# Patient Record
Sex: Female | Born: 1975 | Race: Black or African American | Hispanic: No | Marital: Married | Smoking: Never smoker
Health system: Southern US, Community
[De-identification: ages and names within clinical notes are randomized; demographics above are authoritative.]

## PROBLEM LIST (undated history)

## (undated) DIAGNOSIS — M069 Rheumatoid arthritis, unspecified: Secondary | ICD-10-CM

## (undated) DIAGNOSIS — D573 Sickle-cell trait: Secondary | ICD-10-CM

## (undated) DIAGNOSIS — D571 Sickle-cell disease without crisis: Secondary | ICD-10-CM

## (undated) DIAGNOSIS — Z21 Asymptomatic human immunodeficiency virus [HIV] infection status: Secondary | ICD-10-CM

## (undated) DIAGNOSIS — G894 Chronic pain syndrome: Secondary | ICD-10-CM

## (undated) DIAGNOSIS — B20 Human immunodeficiency virus [HIV] disease: Secondary | ICD-10-CM

## (undated) HISTORY — DX: Chronic pain syndrome: G89.4

## (undated) HISTORY — PX: OSTEOCHONDROMA EXCISION: SHX2137

## (undated) HISTORY — DX: Sickle-cell trait: D57.3

---

## 2010-06-12 ENCOUNTER — Other Ambulatory Visit: Payer: Self-pay | Admitting: Specialist

## 2010-06-12 ENCOUNTER — Ambulatory Visit
Admission: RE | Admit: 2010-06-12 | Discharge: 2010-06-12 | Disposition: A | Discharge status: Home / Self Care | Payer: PRIVATE HEALTH INSURANCE | Source: Ambulatory Visit | Attending: Specialist | Admitting: Specialist

## 2010-10-05 ENCOUNTER — Emergency Department (HOSPITAL_BASED_OUTPATIENT_CLINIC_OR_DEPARTMENT_OTHER)
Admission: EM | Admit: 2010-10-05 | Discharge: 2010-10-05 | Disposition: A | Discharge status: Home / Self Care | Payer: PRIVATE HEALTH INSURANCE | Attending: Emergency Medicine | Admitting: Emergency Medicine

## 2010-10-05 ENCOUNTER — Emergency Department (INDEPENDENT_AMBULATORY_CARE_PROVIDER_SITE_OTHER): Payer: PRIVATE HEALTH INSURANCE

## 2010-10-05 DIAGNOSIS — R5081 Fever presenting with conditions classified elsewhere: Secondary | ICD-10-CM | POA: Insufficient documentation

## 2010-10-05 DIAGNOSIS — D5700 Hb-SS disease with crisis, unspecified: Secondary | ICD-10-CM

## 2010-10-05 DIAGNOSIS — R509 Fever, unspecified: Secondary | ICD-10-CM

## 2010-10-05 DIAGNOSIS — R05 Cough: Secondary | ICD-10-CM | POA: Insufficient documentation

## 2010-10-05 DIAGNOSIS — R319 Hematuria, unspecified: Secondary | ICD-10-CM | POA: Insufficient documentation

## 2010-10-05 DIAGNOSIS — M2550 Pain in unspecified joint: Secondary | ICD-10-CM | POA: Insufficient documentation

## 2010-10-05 DIAGNOSIS — D571 Sickle-cell disease without crisis: Secondary | ICD-10-CM

## 2010-10-05 DIAGNOSIS — R059 Cough, unspecified: Secondary | ICD-10-CM

## 2010-10-05 HISTORY — DX: Sickle-cell disease without crisis: D57.1

## 2010-10-05 LAB — CBC
HCT: 37 % (ref 36.0–46.0)
Hemoglobin: 13.5 g/dL (ref 12.0–15.0)
MCH: 30.8 pg (ref 26.0–34.0)
MCHC: 36.5 g/dL — ABNORMAL HIGH (ref 30.0–36.0)
RBC: 4.38 MIL/uL (ref 3.87–5.11)

## 2010-10-05 LAB — COMPREHENSIVE METABOLIC PANEL
ALT: 26 U/L (ref 0–35)
Alkaline Phosphatase: 57 U/L (ref 39–117)
BUN: 9 mg/dL (ref 6–23)
CO2: 25 mEq/L (ref 19–32)
GFR calc Af Amer: >60 mL/min (ref >60)
GFR calc non Af Amer: >60 mL/min (ref >60)
Glucose, Bld: 102 mg/dL — ABNORMAL HIGH (ref 70–99)
Potassium: 3.8 mEq/L (ref 3.5–5.1)
Total Protein: 8 g/dL (ref 6.0–8.3)

## 2010-10-05 MED ORDER — ONDANSETRON HCL 4 MG/2ML IJ SOLN
INTRAMUSCULAR | Status: AC
  Filled 2010-10-05: qty 2

## 2010-10-05 MED ORDER — HYDROMORPHONE HCL 1 MG/ML IJ SOLN
1.0000 mg | Freq: Once | INTRAMUSCULAR | Status: AC
  Administered 2010-10-05: 1 mg via INTRAVENOUS
  Filled 2010-10-05: qty 1

## 2010-10-05 MED ORDER — ONDANSETRON HCL 4 MG/2ML IJ SOLN
4.0000 mg | Freq: Once | INTRAMUSCULAR | Status: AC
  Administered 2010-10-05: 4 mg via INTRAVENOUS

## 2010-10-05 MED ORDER — KETOROLAC TROMETHAMINE 30 MG/ML IJ SOLN
30.0000 mg | Freq: Once | INTRAMUSCULAR | Status: AC
  Administered 2010-10-05: 30 mg via INTRAVENOUS
  Filled 2010-10-05: qty 1

## 2010-10-05 MED ORDER — HYDROMORPHONE HCL 1 MG/ML IJ SOLN
1.0000 mg | Freq: Once | INTRAMUSCULAR | Status: AC
  Administered 2010-10-05: 1 mg via INTRAVENOUS

## 2010-10-05 MED ORDER — HYDROMORPHONE HCL 1 MG/ML IJ SOLN
INTRAMUSCULAR | Status: AC
  Filled 2010-10-05: qty 1

## 2010-10-05 MED ORDER — SODIUM CHLORIDE 0.9 % IV BOLUS (SEPSIS): 500.0000 mL | Freq: Once | INTRAVENOUS | Status: DC

## 2010-10-05 NOTE — ED Provider Notes (Signed)
History     CSN: [id number] Arrival date & time: 10/05/2010  7:45 PM  Chief Complaint  Patient presents with  . Sickle Cell Pain Crisis   Patient is a 35 y.o. female presenting with sickle cell pain. The history is provided by the patient. No language interpreter was used.  Sickle Cell Pain Crisis  This is a recurrent problem. The onset was sudden. The problem occurs continuously. The problem has been unchanged. The pain is associated with an unknown factor. The pain location is generalized. The symptoms are relieved by nothing. The symptoms are not relieved by acetaminophen. The symptoms are aggravated by activity and movement. Associated symptoms include hematuria and joint pain. Pertinent negatives include no chest pain, no blurred vision, no double vision, no photophobia, no abdominal pain, no constipation, no diarrhea, no nausea, no dysuria, no congestion, no ear pain, no headaches, no rhinorrhea, no sore throat, no swollen glands, no loss of sensation, no tingling, no weakness, no difficulty breathing and no eye pain.    Past Medical History  Diagnosis Date  . Sickle cell anemia     History reviewed. No pertinent past surgical history.  No family history on file.  History  Substance Use Topics  . Smoking status: Never Smoker   . Smokeless tobacco: Not on file  . Alcohol Use: No    OB History    Grav Para Term Preterm Abortions TAB SAB Ect Mult Living                  Review of Systems  Constitutional: Negative for activity change.  HENT: Negative for ear pain, congestion, sore throat and rhinorrhea.   Eyes: Negative for blurred vision, double vision, photophobia and pain.  Respiratory: Negative for apnea.   Cardiovascular: Negative for chest pain.  Gastrointestinal: Negative for nausea, abdominal pain, diarrhea, constipation and abdominal distention.  Genitourinary: Positive for hematuria. Negative for dysuria.  Musculoskeletal: Positive for joint pain.  Skin:  Negative.   Neurological: Negative for tingling, weakness and headaches.  Hematological: Negative for adenopathy.  Psychiatric/Behavioral: Negative for agitation.    Physical Exam  BP 124/86  Pulse 87  Temp(Src) 98.5 F (36.9 C) (Oral)  Resp 20  Ht 5\' 4"  (1.626 m)  Wt 136 lb (61.689 kg)  BMI 23.34 kg/m2  SpO2 100%  LMP 10/02/2010  Physical Exam  Constitutional: She is oriented to person, place, and time. She appears well-developed and well-nourished.  HENT:  Head: Normocephalic and atraumatic.  Eyes: EOM are normal. Pupils are equal, round, and reactive to light. Right eye exhibits no discharge. Left eye exhibits no discharge. No scleral icterus.  Neck: Normal range of motion. Neck supple.  Cardiovascular: Normal rate and regular rhythm.   Pulmonary/Chest: Effort normal and breath sounds normal. No respiratory distress.  Abdominal: Soft. Bowel sounds are normal.  Musculoskeletal: Normal range of motion. She exhibits no edema and no tenderness.  Neurological: She is alert and oriented to person, place, and time. No cranial nerve deficit.  Skin: Skin is warm and dry. No erythema.  Psychiatric: She has a normal mood and affect.    ED Course  Procedures  MDM   Follow up with your family doctor return for CP, SOB n/v/d. Return for worsening symptoms  Kingsly Kloepfer K Crystin Lechtenberg-Rasch, MD 10/05/10 2239

## 2010-10-05 NOTE — ED Notes (Signed)
C/o "pain all over" since yesterday

## 2010-10-05 NOTE — ED Notes (Signed)
In and out cath performed, pt tolerated well.

## 2010-10-06 LAB — RETICULOCYTES: Retic Count, Absolute: 61.7 10*3/uL (ref 19.0–186.0)

## 2011-01-26 ENCOUNTER — Ambulatory Visit: Payer: PRIVATE HEALTH INSURANCE

## 2011-01-26 DIAGNOSIS — R05 Cough: Secondary | ICD-10-CM

## 2011-08-15 ENCOUNTER — Non-Acute Institutional Stay (HOSPITAL_COMMUNITY)
Admission: AD | Admit: 2011-08-15 | Discharge: 2011-08-16 | Disposition: A | Discharge status: Home / Self Care | Payer: Self-pay | Attending: Internal Medicine | Admitting: Internal Medicine

## 2011-08-15 ENCOUNTER — Encounter (HOSPITAL_COMMUNITY): Payer: Self-pay | Admitting: Hematology

## 2011-08-15 ENCOUNTER — Non-Acute Institutional Stay (HOSPITAL_COMMUNITY): Payer: Self-pay

## 2011-08-15 DIAGNOSIS — D5700 Hb-SS disease with crisis, unspecified: Secondary | ICD-10-CM | POA: Insufficient documentation

## 2011-08-15 DIAGNOSIS — R0789 Other chest pain: Secondary | ICD-10-CM | POA: Insufficient documentation

## 2011-08-15 DIAGNOSIS — E860 Dehydration: Secondary | ICD-10-CM | POA: Insufficient documentation

## 2011-08-15 LAB — CBC
HCT: 35.7 % — ABNORMAL LOW (ref 36.0–46.0)
MCHC: 35 g/dL (ref 30.0–36.0)
Platelets: 216 10*3/uL (ref 150–400)
RDW: 13.5 % (ref 11.5–15.5)
WBC: 4.3 10*3/uL (ref 4.0–10.5)

## 2011-08-15 LAB — COMPREHENSIVE METABOLIC PANEL
ALT: 17 U/L (ref 0–35)
Alkaline Phosphatase: 58 U/L (ref 39–117)
BUN: 12 mg/dL (ref 6–23)
CO2: 25 mEq/L (ref 19–32)
Chloride: 101 mEq/L (ref 96–112)
GFR calc Af Amer: >90 mL/min (ref >90)
GFR calc non Af Amer: >90 mL/min (ref >90)
Glucose, Bld: 99 mg/dL (ref 70–99)
Potassium: 3.5 mEq/L (ref 3.5–5.1)
Sodium: 135 mEq/L (ref 135–145)
Total Bilirubin: 0.2 mg/dL — ABNORMAL LOW (ref 0.3–1.2)
Total Protein: 8 g/dL (ref 6.0–8.3)

## 2011-08-15 LAB — POCT URINALYSIS DIP (DEVICE)
Hgb urine dipstick: NEGATIVE
Nitrite: NEGATIVE
Specific Gravity, Urine: 1.02 (ref 1.005–1.030)
Urobilinogen, UA: 0.2 mg/dL (ref 0.0–1.0)
pH: 6 (ref 5.0–8.0)

## 2011-08-15 LAB — DIFFERENTIAL
Basophils Absolute: 0 10*3/uL (ref 0.0–0.1)
Basophils Relative: 0 % (ref 0–1)
Lymphocytes Relative: 58 % — ABNORMAL HIGH (ref 12–46)
Monocytes Absolute: 0.3 10*3/uL (ref 0.1–1.0)
Neutro Abs: 1.5 10*3/uL — ABNORMAL LOW (ref 1.7–7.7)
Neutrophils Relative %: 34 % — ABNORMAL LOW (ref 43–77)

## 2011-08-15 LAB — RETICULOCYTES: Retic Ct Pct: 1.3 % (ref 0.4–3.1)

## 2011-08-15 MED ORDER — ONDANSETRON HCL 4 MG PO TABS: 4.0000 mg | ORAL_TABLET | ORAL | Status: DC | PRN

## 2011-08-15 MED ORDER — DEXTROSE-NACL 5-0.45 % IV SOLN
INTRAVENOUS | Status: DC
  Administered 2011-08-15 – 2011-08-16 (×3): via INTRAVENOUS

## 2011-08-15 MED ORDER — DIPHENHYDRAMINE HCL 25 MG PO CAPS: 25.0000 mg | ORAL_CAPSULE | ORAL | Status: DC | PRN

## 2011-08-15 MED ORDER — DIPHENHYDRAMINE HCL 50 MG/ML IJ SOLN
12.5000 mg | INTRAMUSCULAR | Status: DC | PRN
  Administered 2011-08-15: 25 mg via INTRAVENOUS
  Filled 2011-08-15: qty 1

## 2011-08-15 MED ORDER — FOLIC ACID 1 MG PO TABS
1.0000 mg | ORAL_TABLET | Freq: Every day | ORAL | Status: DC
  Administered 2011-08-16: 1 mg via ORAL
  Filled 2011-08-15: qty 1

## 2011-08-15 MED ORDER — SODIUM CHLORIDE 0.9 % IV BOLUS (SEPSIS)
250.0000 mL | Freq: Once | INTRAVENOUS | Status: AC
  Administered 2011-08-15: 250 mL via INTRAVENOUS

## 2011-08-15 MED ORDER — ONDANSETRON HCL 4 MG/2ML IJ SOLN
4.0000 mg | INTRAMUSCULAR | Status: DC | PRN
  Administered 2011-08-15 – 2011-08-16 (×5): 4 mg via INTRAVENOUS
  Filled 2011-08-15 (×5): qty 2

## 2011-08-15 MED ORDER — HYDROMORPHONE HCL PF 2 MG/ML IJ SOLN
2.0000 mg | INTRAMUSCULAR | Status: DC | PRN
  Administered 2011-08-15: 4 mg via INTRAVENOUS
  Administered 2011-08-15 – 2011-08-16 (×6): 2 mg via INTRAVENOUS
  Filled 2011-08-15 (×8): qty 1

## 2011-08-15 NOTE — H&P (Signed)
[facility] History and Physical   Date: 08/15/2011  Patient name: Tina Huber Medical record number: [id number] Date of birth: 03-28-1975 Age: 36 y.o. Gender: female PCP: No primary provider on file.  Attending physician: Gwenyth Bender, MD  Chief Complaint: Chief complaint is pain in chest, abdomen and BLE History of Present Illness: This is a 36 year old African [place] female new to clinic presented today bent over in pain. States she has Sickle Cell disease last seen and treated was in [place] September 2012. She has related to [facility]. With her son. 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   Meds: Prescriptions prior to admission  Medication Sig Dispense Refill  . fentaNYL (DURAGESIC - DOSED MCG/HR) 50 MCG/HR Place 1 patch onto the skin every 3 (three) days.        Di Kindle SULFATE PO Take by mouth.        . FOLIC ACID PO Take by mouth.          Allergies: Quinine derivatives Past Medical History  Diagnosis Date  . Sickle cell anemia    No past surgical history on file. No family history on file. History   Social History  . Marital Status: Married    Spouse Name: N/A    Number of Children: N/A  . Years of Education: N/A   Occupational History  . Not on file.   Social History Main Topics  . Smoking status: Never Smoker   . Smokeless tobacco: Not on file  . Alcohol Use: No  . Drug Use: No  . Sexually Active:    Other Topics Concern  . Not on file   Social History Narrative  . No narrative on file    Review of Systems: Review Of Systems negative except for diffuse pain.   Physical Exam: BP 124/80  Pulse 90  Temp 94.1 F (34.5 C) (Oral)  Resp 18  SpO2 100%  LMP 07/19/2011   General Appearance:    Alert, cooperative, no distress, appears stated age  Head:    Normocephalic, without obvious abnormality, atraumatic  Eyes:    PERRL, conjunctiva/corneas are red, EOM's  intact, fundi    benign, both eyes  Ears:    Normal TM's and external ear canals, both ears  Nose:   Nares normal, septum midline, mucosa normal, no drainage    or sinus tenderness  Throat:   Lips, mucosa, and tongue dry ; teeth  In good repair and gums normal  Neck:   Supple, symmetrical, trachea midline, no adenopathy;    thyroid:  no enlargement/tenderness/nodules; no carotid   bruit or JVD  Back:     Symmetric, no curvature, ROM normal, bilateral CVA tenderness  Lungs:     Clear to auscultation bilaterally, respirations unlabored      Heart:    Regular rate and rhythm, S1 and S2 normal, no murmur, rub   or gallop     Abdomen:     Soft, non-tender, bowel sounds active all four quadrants,    no masses, no organomegaly  Genitalia:    Deferred  Rectal:    Deferred   Extremities:   Extremities normal, atraumatic, no cyanosis or edema Homans sign negative   Pulses:   2+ and symmetric all extremities  Skin:   Skin color, texture, poor  turgor , no rashes or lesions  Lymph nodes:   Cervical, supraclavicular, and axillary nodes normal  Neurologic:  CNII-XII intact    Lab results:  No results found for this or any previous visit (from the past 24 hour(s)). Imaging results:  No results found.  Assessment & Plan: Patient Active Hospital Problem List: Sickle Cell Crisis : pain management labs/evaluation IVF  Dehydration IVF    Mcdonald Reiling P 08/15/2011, 3:49 PM

## 2011-08-16 LAB — COMPREHENSIVE METABOLIC PANEL
BUN: 6 mg/dL (ref 6–23)
Calcium: 8.6 mg/dL (ref 8.4–10.5)
Creatinine, Ser: 0.57 mg/dL (ref 0.50–1.10)
GFR calc Af Amer: >90 mL/min (ref >90)
GFR calc non Af Amer: >90 mL/min (ref >90)
Glucose, Bld: 89 mg/dL (ref 70–99)
Sodium: 133 mEq/L — ABNORMAL LOW (ref 135–145)
Total Protein: 7.5 g/dL (ref 6.0–8.3)

## 2011-08-16 LAB — CBC WITH DIFFERENTIAL/PLATELET
Eosinophils Absolute: 0 10*3/uL (ref 0.0–0.7)
Eosinophils Relative: 0 % (ref 0–5)
HCT: 34.2 % — ABNORMAL LOW (ref 36.0–46.0)
Lymphs Abs: 1.8 10*3/uL (ref 0.7–4.0)
MCH: 30.5 pg (ref 26.0–34.0)
MCV: 86.8 fL (ref 78.0–100.0)
Monocytes Absolute: 0.4 10*3/uL (ref 0.1–1.0)
Monocytes Relative: 8 % (ref 3–12)
Platelets: 200 10*3/uL (ref 150–400)
RBC: 3.94 MIL/uL (ref 3.87–5.11)

## 2011-08-16 LAB — C-REACTIVE PROTEIN: CRP: <0.5 mg/dL — ABNORMAL LOW (ref <0.60)

## 2011-08-16 LAB — FERRITIN: Ferritin: 105 ng/mL (ref 10–291)

## 2011-08-16 LAB — PROTEIN C ACTIVITY: Protein C Activity: 123 % (ref 75–133)

## 2011-08-16 MED ORDER — TRAMADOL HCL 50 MG PO TABS: 100.0000 mg | ORAL_TABLET | Freq: Four times a day (QID) | ORAL | Status: AC | PRN

## 2011-08-16 MED ORDER — HYDROCODONE-ACETAMINOPHEN 10-325 MG PO TABS: 1.0000 | ORAL_TABLET | Freq: Four times a day (QID) | ORAL | Status: AC | PRN

## 2011-08-16 NOTE — Progress Notes (Signed)
Patient ID: Tina Huber, female   DOB: 1975-08-03, 36 y.o.   MRN: [id number] Discharge instructions given to patient, IV removed without difficulty.

## 2011-08-16 NOTE — Discharge Summary (Signed)
[facility] Discharge Summary   Patient ID: Tina Huber MRN: [id number] DOB/AGE: 1975-05-18 36 y.o.  Admit date: 08/15/2011 Discharge date: 08/16/2011  Primary Care Physician:  No primary provider on file.  Admission Diagnoses:  Active Problems: Sickle cell disease    Discharge Diagnoses:   Sickle cell with crisis resolved   Discharge Medications:  Medication List  As of 08/16/2011 10:42 AM   ASK your doctor about these medications         fentaNYL 50 MCG/HR   Commonly known as: DURAGESIC - dosed mcg/hr   Place 1 patch onto the skin every 3 (three) days.      FERROUS SULFATE PO   Take by mouth.      FOLIC ACID PO   Take by mouth.             Consults:None  Significant Diagnostic Studies:  Dg Chest 2 View  08/15/2011  *RADIOLOGY REPORT*  Clinical Data: Inspiratory chest pain, sickle cell disease  CHEST - 2 VIEW  Comparison: 10/05/2010  Findings: Cardiomediastinal silhouette is within normal limits. The lungs are clear. No pleural effusion.  No pneumothorax.  No acute osseous abnormality.  IMPRESSION: Normal chest.  Original Report Authenticated By: Harrel Lemon, M.D.     [facility] Course:  For complete details please refer to admission H and P, but in brief, Tina Huber present to the Sickle cell clinic in pain  via word of mouth about the clinic being open and the treatment of Sickle cell . Also needing to establish care since moving to [place]. She had diffuse pain in chest abdomen and bilateral legs. Treated  aggressively (appeared to be dehydrated ) with IVF, pain management and antiemetics.   Physical Exam at Discharge:  BP 94/52  Pulse 82  Temp 98.2 F (36.8 C) (Oral)  Resp 18  SpO2 100%  LMP 07/19/2011  General Appearance: Drowsy but oriented, cooperative, well nourished, well developed, no apparent distress  Back: Symmetric, no curvature,  bilateral CVA tenderness, diffuse tenderness  Cardio: Regular rate and rhythm,  S1, S2 normal, tachycardic at times, no murmur/click/rub/gallop  GI: Soft, distended, non-tender, hypoactive bowel sounds, no organomegaly  Extremities: Extremities normal, atraumatic, no cyanosis, no edema, Homans sign is negative, tender bilateral LEs,  Pulses: 2+ and symmetric   Disposition at Discharge: 01-Home or Self Care  Discharge Orders: See discharge instructions   Condition at Discharge:   Stable  Time spent on Discharge:  Greater than 30 minutes.  Signed: Bethsaida Siegenthaler P 08/16/2011, 10:42 AM

## 2011-08-17 LAB — LIPID PANEL
Cholesterol: 183 mg/dL (ref 0–200)
HDL: 67 mg/dL (ref >39)
Total CHOL/HDL Ratio: 2.7 RATIO

## 2011-08-17 LAB — URINE CULTURE: Colony Count: 8000

## 2011-08-17 LAB — HEMOGLOBINOPATHY EVALUATION: Hgb S Quant: 40.5 % — ABNORMAL HIGH

## 2011-08-18 LAB — PROTEIN S, TOTAL: Protein S Ag, Total: 72 % (ref 60–150)

## 2011-11-20 ENCOUNTER — Telehealth (HOSPITAL_COMMUNITY): Payer: Self-pay | Admitting: Hematology

## 2012-01-14 ENCOUNTER — Non-Acute Institutional Stay (HOSPITAL_COMMUNITY)
Admission: AD | Admit: 2012-01-14 | Discharge: 2012-01-15 | Disposition: A | Discharge status: Home / Self Care | Payer: PRIVATE HEALTH INSURANCE | Source: Ambulatory Visit | Attending: Internal Medicine | Admitting: Internal Medicine

## 2012-01-14 ENCOUNTER — Encounter (HOSPITAL_COMMUNITY): Payer: Self-pay | Admitting: Hematology

## 2012-01-14 ENCOUNTER — Other Ambulatory Visit (HOSPITAL_COMMUNITY): Payer: Self-pay | Admitting: *Deleted

## 2012-01-14 DIAGNOSIS — E876 Hypokalemia: Secondary | ICD-10-CM | POA: Insufficient documentation

## 2012-01-14 DIAGNOSIS — R0602 Shortness of breath: Secondary | ICD-10-CM | POA: Insufficient documentation

## 2012-01-14 DIAGNOSIS — E860 Dehydration: Secondary | ICD-10-CM | POA: Insufficient documentation

## 2012-01-14 DIAGNOSIS — D5700 Hb-SS disease with crisis, unspecified: Secondary | ICD-10-CM

## 2012-01-14 DIAGNOSIS — R52 Pain, unspecified: Secondary | ICD-10-CM | POA: Insufficient documentation

## 2012-01-14 DIAGNOSIS — G8929 Other chronic pain: Secondary | ICD-10-CM | POA: Insufficient documentation

## 2012-01-14 LAB — CBC WITH DIFFERENTIAL/PLATELET
Basophils Absolute: 0 10*3/uL (ref 0.0–0.1)
Basophils Relative: 0 % (ref 0–1)
Eosinophils Absolute: 0.1 10*3/uL (ref 0.0–0.7)
Eosinophils Relative: 1 % (ref 0–5)
Lymphs Abs: 2 10*3/uL (ref 0.7–4.0)
MCH: 30.3 pg (ref 26.0–34.0)
MCV: 86.2 fL (ref 78.0–100.0)
Neutrophils Relative %: 37 % — ABNORMAL LOW (ref 43–77)
Platelets: 246 10*3/uL (ref 150–400)
RBC: 4.19 MIL/uL (ref 3.87–5.11)
RDW: 14 % (ref 11.5–15.5)

## 2012-01-14 LAB — COMPREHENSIVE METABOLIC PANEL
Albumin: 3.7 g/dL (ref 3.5–5.2)
Alkaline Phosphatase: 58 U/L (ref 39–117)
BUN: 14 mg/dL (ref 6–23)
Chloride: 99 mEq/L (ref 96–112)
Creatinine, Ser: 0.72 mg/dL (ref 0.50–1.10)
GFR calc Af Amer: >90 mL/min (ref >90)
Glucose, Bld: 84 mg/dL (ref 70–99)
Total Bilirubin: 0.3 mg/dL (ref 0.3–1.2)
Total Protein: 7.6 g/dL (ref 6.0–8.3)

## 2012-01-14 LAB — RETICULOCYTES
RBC.: 4.19 MIL/uL (ref 3.87–5.11)
Retic Ct Pct: 1.2 % (ref 0.4–3.1)

## 2012-01-14 MED ORDER — DIPHENHYDRAMINE HCL 25 MG PO CAPS
25.0000 mg | ORAL_CAPSULE | ORAL | Status: DC | PRN
Start: 2012-01-14 — End: 2012-01-15
  Administered 2012-01-15: 25 mg via ORAL
  Filled 2012-01-14: qty 1

## 2012-01-14 MED ORDER — DEXTROSE-NACL 5-0.45 % IV SOLN
INTRAVENOUS | Status: DC
  Administered 2012-01-14 – 2012-01-15 (×3): via INTRAVENOUS

## 2012-01-14 MED ORDER — ONDANSETRON HCL 4 MG/2ML IJ SOLN
4.0000 mg | INTRAMUSCULAR | Status: DC | PRN
  Administered 2012-01-14 – 2012-01-15 (×3): 4 mg via INTRAVENOUS
  Filled 2012-01-14 (×3): qty 2

## 2012-01-14 MED ORDER — FOLIC ACID 1 MG PO TABS
1.0000 mg | ORAL_TABLET | Freq: Every day | ORAL | Status: DC
  Administered 2012-01-14 – 2012-01-15 (×2): 1 mg via ORAL
  Filled 2012-01-14 (×2): qty 1

## 2012-01-14 MED ORDER — HYDROMORPHONE HCL PF 2 MG/ML IJ SOLN
1.0000 mg | INTRAMUSCULAR | Status: DC | PRN
  Administered 2012-01-14 – 2012-01-15 (×8): 2 mg via INTRAVENOUS
  Filled 2012-01-14 (×7): qty 1

## 2012-01-14 MED ORDER — DIPHENHYDRAMINE HCL 50 MG/ML IJ SOLN
12.5000 mg | INTRAMUSCULAR | Status: DC | PRN
  Administered 2012-01-14: 25 mg via INTRAVENOUS
  Filled 2012-01-14: qty 1

## 2012-01-14 MED ORDER — ONDANSETRON HCL 4 MG PO TABS: 4.0000 mg | ORAL_TABLET | ORAL | Status: DC | PRN
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
## 2012-01-15 LAB — CBC WITH DIFFERENTIAL/PLATELET
Eosinophils Relative: 0 % (ref 0–5)
HCT: 31.6 % — ABNORMAL LOW (ref 36.0–46.0)
Lymphocytes Relative: 43 % (ref 12–46)
Lymphs Abs: 1.7 10*3/uL (ref 0.7–4.0)
MCV: 87.5 fL (ref 78.0–100.0)
Monocytes Absolute: 0.4 10*3/uL (ref 0.1–1.0)
Monocytes Relative: 11 % (ref 3–12)
RBC: 3.61 MIL/uL — ABNORMAL LOW (ref 3.87–5.11)
WBC: 3.9 10*3/uL — ABNORMAL LOW (ref 4.0–10.5)

## 2012-01-15 LAB — COMPREHENSIVE METABOLIC PANEL
Albumin: 3.2 g/dL — ABNORMAL LOW (ref 3.5–5.2)
Alkaline Phosphatase: 45 U/L (ref 39–117)
BUN: 6 mg/dL (ref 6–23)
Potassium: 3 mEq/L — ABNORMAL LOW (ref 3.5–5.1)
Sodium: 133 mEq/L — ABNORMAL LOW (ref 135–145)
Total Protein: 7 g/dL (ref 6.0–8.3)

## 2012-01-15 LAB — GLUCOSE, CAPILLARY: Glucose-Capillary: 134 mg/dL — ABNORMAL HIGH (ref 70–99)

## 2012-01-15 LAB — FERRITIN: Ferritin: 71 ng/mL (ref 10–291)

## 2012-01-15 LAB — BASIC METABOLIC PANEL
CO2: 27 mEq/L (ref 19–32)
Chloride: 97 mEq/L (ref 96–112)
Creatinine, Ser: 0.63 mg/dL (ref 0.50–1.10)

## 2012-01-15 MED ORDER — POTASSIUM CHLORIDE 10 MEQ/100ML IV SOLN
10.0000 meq | INTRAVENOUS | Status: AC
  Administered 2012-01-15 (×2): 10 meq via INTRAVENOUS
  Filled 2012-01-15 (×2): qty 100

## 2012-01-15 MED ORDER — POTASSIUM CHLORIDE 10 MEQ/100ML IV SOLN: 10.0000 meq | INTRAVENOUS | Status: DC

## 2012-01-15 MED ORDER — POTASSIUM CHLORIDE CRYS ER 20 MEQ PO TBCR
40.0000 meq | EXTENDED_RELEASE_TABLET | Freq: Once | ORAL | Status: AC
  Administered 2012-01-15: 40 meq via ORAL
  Filled 2012-01-15: qty 2

## 2012-01-15 MED ORDER — POTASSIUM CHLORIDE CRYS ER 20 MEQ PO TBCR: 20.0000 meq | EXTENDED_RELEASE_TABLET | Freq: Two times a day (BID) | ORAL | Status: DC

## 2012-01-15 MED ORDER — SODIUM CHLORIDE 0.45 % IV SOLN
INTRAVENOUS | Status: DC
  Administered 2012-01-15: 13:00:00 via INTRAVENOUS

## 2012-01-15 MED ORDER — POTASSIUM CHLORIDE ER 10 MEQ PO TBCR: 20.0000 meq | EXTENDED_RELEASE_TABLET | Freq: Two times a day (BID) | ORAL | Status: DC

## 2012-01-15 MED ORDER — HYDROCODONE-ACETAMINOPHEN 5-325 MG PO TABS: 1.0000 | ORAL_TABLET | ORAL | Status: DC | PRN

## 2012-01-15 NOTE — Discharge Summary (Signed)
[facility] Discharge Summary   Patient ID: Tina Huber MRN: [id number] DOB/AGE: [birth year] 36 y.o.  Admit date: 01/14/2012 Discharge date: 01/15/2012  Primary Care Physician:  Dr. Willey Blade   Admission Diagnoses:  Active Problems:  Dehydration replete Acute on chronic pain  Vaso occlusive crisis without active hemolysis  Hypokalemia  Discharge Diagnoses:   Dehydration replete Acute on chronic pain  Vaso occlusive crisis without active hemolysis  Hypokalemia replete   Discharge Medications:    Medication List     As of 01/15/2012 11:38 AM    ASK your doctor about these medications         ferrous fumarate 325 (106 FE) MG Tabs   Commonly known as: HEMOCYTE - 106 mg FE   Take 1 tablet by mouth daily.      FOLIC ACID PO   Take by mouth.         Consults:  nONE  Significant Diagnostic Studies:  No results found.   [facility] Course:  For complete details please refer to admission H and P, but in brief a 36 yr old AA female with Hgb SS Sickle Cell Disease who presented to the [facility] yesterday, for increased weakness and pain she admitted for 23 hour observation to further evaluate and treat increased sickle pain and possible crisis. She  was noted to have an 0.3  Total Bili of  on admission and Hgb 12.7 . She was aggressively  hydrated with IVF, given pain medication and pain improved overnight. Repeat labs this morning revealed low potassium she was given 2 runs and po with increase to 3.2 and given a prescription for  K+ 20 meq QD and encourage increasing K+ rich foods in her diet. She feels that she can now  manage her pain at home on ibuprofen and Norco.  Evidence of volume depleted and dehydration fluid  resuscitated with 2.5 liters, volume status improve encourage to increase po fluids and notify if n/v and diarrhea occurs. Follow up with Dr. August Saucer in 1 week.   Physical Exam at Discharge:  BP 116/68  Pulse 90  Temp 98 F  (36.7 C) (Oral)  Resp 18  Ht 5' (1.524 m)  Wt 58.968 kg (130 lb)  BMI 25.39 kg/m2  SpO2 100%  LMP 12/21/2011 General appearance: Alert and oriented, well nourished, well developed, appears stated age, no acute distress  Lungs: Diminished breath sounds bibasilar and bilaterally, CTA, no wheezes/rales/rhonchi  Heart: regular rate and rhythm, S1, S2 normal, no murmur, click, rub or gallop  Abdomen: Soft, non Tender non distended, hypoactive bowel sounds, no masses, no organomegaly  Skin: Tribal markings across low back since age of 8 no s/s infection, poor skin tugor  Extremities: Homans sign is negative, no sign of DVT  Neurologic: Grossly normal, AO x3, no focal deficits, CN II-XII intact  Psych: Appropriate affect   Disposition at Discharge: Home or Self Care  Discharge Orders: See discharge summary   Condition at Discharge:   Stable  Time spent on Discharge:  Greater than 30 minutes.  Signed: Shalika Huber P 01/15/2012, 11:38 AM

## 2012-01-15 NOTE — Progress Notes (Signed)
Patient ID: Tina Huber, female   DOB: [birth year], 36 y.o.   MRN: [id number] Pt discharged to home; discharge instructions given; all questions answered; IV removed with catheter intact, no problems noted; pain at level 4/10 upon discharge

## 2012-01-15 NOTE — Progress Notes (Signed)
Labs redrawn to confirm glucose and potassium levels; Glucose - 126 by confirmed blood draw and glucose - 134 by finger stick and potassium 3.0 per lab redraw; NP notified; will continue to monitor

## 2012-01-15 NOTE — Progress Notes (Signed)
CRITICAL VALUE ALERT  Critical value received:  K+ - 2.6, Glucose - 858  Date of notification:  01/15/2012  Time of notification:  0845  Critical value read back:yes  Nurse who received alert:  B. Katrinka Blazing  MD notified (1st page):  Westley Hummer, NP  Time of first page:  047  MD notified (2nd page):  Time of second page:  Responding MD:  Westley Hummer, NP  Time MD responded:  [phone]

## 2012-01-17 LAB — HEMOGLOBINOPATHY EVALUATION
Hemoglobin Other: 0 %
Hgb A: 54.5 % — ABNORMAL LOW (ref 96.8–97.8)

## 2012-06-13 ENCOUNTER — Encounter: Payer: Self-pay | Admitting: Internal Medicine

## 2012-06-13 ENCOUNTER — Ambulatory Visit (HOSPITAL_COMMUNITY)
Admission: AD | Admit: 2012-06-13 | Discharge: 2012-06-13 | Disposition: A | Discharge status: Home / Self Care | Payer: 59 | Source: Ambulatory Visit | Attending: Internal Medicine | Admitting: Internal Medicine

## 2012-06-13 ENCOUNTER — Ambulatory Visit (INDEPENDENT_AMBULATORY_CARE_PROVIDER_SITE_OTHER): Payer: 59 | Admitting: Primary Care

## 2012-06-13 ENCOUNTER — Encounter: Payer: Self-pay | Admitting: Primary Care

## 2012-06-13 VITALS — BP 106/78 | HR 78 | Temp 98.1°F | Wt 134.0 lb

## 2012-06-13 DIAGNOSIS — D573 Sickle-cell trait: Secondary | ICD-10-CM

## 2012-06-13 DIAGNOSIS — M79609 Pain in unspecified limb: Secondary | ICD-10-CM

## 2012-06-13 DIAGNOSIS — D571 Sickle-cell disease without crisis: Secondary | ICD-10-CM | POA: Insufficient documentation

## 2012-06-13 LAB — URIC ACID: Uric Acid, Serum: 4.5 mg/dL (ref 2.4–7.0)

## 2012-06-13 LAB — SEDIMENTATION RATE: Sed Rate: 20 mm/hr (ref 0–22)

## 2012-06-13 MED ORDER — TRAMADOL HCL 50 MG PO TABS: 50.0000 mg | ORAL_TABLET | Freq: Three times a day (TID) | ORAL | Status: DC | PRN

## 2012-06-13 NOTE — Progress Notes (Signed)
    SICKLE CELL SERVICE PROGRESS NOTE ID: Tina Huber, female   DOB: February 03, 1975, 37 y.o.    MRN: [id number] PCP: Marthann Schiller, MD 06/13/2012   Chief Complaint  Patient presents with  . Follow-up  . Wrist Pain    swelling and painful since last night    Subjective:  Ms. Tina Huber is a 37 year old female in the office for f/u and lab review. Note, also has a new c/o of right wrist swelling and painful no pallative factors but provacative factors are movement and lifting. She denies falling, hitting or sleeping on her wrist.   Review of Systems - Negative except for right  wrist swelling and painful x's 24hrs   Allergies  Allergen Reactions  . Quinine Derivatives Itching   Current Outpatient Prescriptions on File Prior to Visit  Medication Sig Dispense Refill  . FOLIC ACID PO Take by mouth.        Marland Kitchen HYDROcodone-acetaminophen (NORCO/VICODIN) 5-325 MG per tablet Take 1 tablet by mouth every 4 (four) hours as needed for pain.  60 tablet  0  . potassium chloride (K-DUR) 10 MEQ tablet Take 2 tablets (20 mEq total) by mouth 2 (two) times daily.  30 tablet  1   No current facility-administered medications on file prior to visit.      Filed Vitals:   06/13/12 1304  BP: 106/78  Pulse: 78  Temp: 98.1 F (36.7 C)   Physical Exam  General: Alert, awake, oriented x3, in no acute distress.  HEENT: [place]/AT PEERL, EOMI Neck: Trachea midline,  no masses, no thyromegal,y no JVD, no carotid bruit OROPHARYNX:  Moist, No exudate/ erythema/lesions.  Heart: Regular rate and rhythm, without murmurs, rubs, gallops, PMI non-displaced, no heaves or thrills on palpation.  Lungs: Clear to auscultation, no wheezing or rhonchi noted. No increased vocal fremitus resonant to percussion  Abdomen: Soft, nontender, nondistended, positive bowel sounds, no masses no hepatosplenomegaly noted..  Neuro: No focal neurological deficits noted cranial nerves II through XII grossly intact. DTRs 2+ bilaterally  upper and lower extremities. Strength 5 out of 5 in bilateral upper and lower extremities. Musculoskeletal: right wrist swollen extends to fingers warm to touch and painful and tender no spinal tenderness noted. Skin: tribal marking on lower back  Psychiatric: Patient alert and oriented x3, Lymph node survey: No cervical axillary or inguinal lymphadenopathy noted.   Assessment/Plan:  Sickle cell trait- genotype Hb S with intermittent pain. Will change Noco to tramadol 50 mg bid prn for pain. Norco too sedating and unable to perform effectively at work and used at night but no relief at wk. Right wrist pain-(r/o gout)  warm to touch slightly swollen extends to fingers -inflammation - inflammatory process unclear of etiology. She may use ibuprofen 800 mg tid with food. Will obtain uric acid.    Gwinda Passe, NP

## 2012-07-11 ENCOUNTER — Ambulatory Visit: Payer: 59 | Admitting: Primary Care

## 2012-07-28 ENCOUNTER — Telehealth: Payer: Self-pay | Admitting: Internal Medicine

## 2012-07-29 ENCOUNTER — Telehealth: Payer: Self-pay | Admitting: Hematology

## 2012-07-29 NOTE — Telephone Encounter (Signed)
Not sure if you seen this refill request.

## 2012-07-31 ENCOUNTER — Other Ambulatory Visit: Payer: Self-pay | Admitting: Internal Medicine

## 2012-07-31 DIAGNOSIS — D5700 Hb-SS disease with crisis, unspecified: Secondary | ICD-10-CM

## 2012-07-31 MED ORDER — HYDROCODONE-ACETAMINOPHEN 5-325 MG PO TABS: 1.0000 | ORAL_TABLET | ORAL | Status: DC | PRN

## 2012-07-31 NOTE — Progress Notes (Signed)
Prescription refilled for Vicoden #60 tabs.

## 2012-08-12 ENCOUNTER — Ambulatory Visit (INDEPENDENT_AMBULATORY_CARE_PROVIDER_SITE_OTHER): Payer: 59 | Admitting: Primary Care

## 2012-08-12 ENCOUNTER — Encounter: Payer: Self-pay | Admitting: Primary Care

## 2012-08-12 VITALS — BP 121/85 | HR 92 | Temp 98.6°F | Wt 128.0 lb

## 2012-08-12 DIAGNOSIS — D5700 Hb-SS disease with crisis, unspecified: Secondary | ICD-10-CM

## 2012-08-12 MED ORDER — HYDROCODONE-ACETAMINOPHEN 5-325 MG PO TABS: 1.0000 | ORAL_TABLET | ORAL | Status: DC | PRN

## 2012-08-12 NOTE — Progress Notes (Signed)
Patient ID: Tina Huber, female   DOB: March 01, 1975, 37 y.o.   MRN: [id number],  PCP: Marthann Schiller, MD [facility] PROGRESS NOTE   08/12/2012   Chief Complaint  Patient presents with  . Leg Pain  . Hand Pain    Subjective: Tina Huber in today for an acute visit she has been having increased pain in her left lower arm and  bilateral leg pain this has been esculating for several weeks. Provative factors where/are going back and fourth to [place] for a sick uncle who is potentially dying. She has also been sleeping in chair in the hospital. Increase stressor causing painful crisis rates pain 6/10. Tramadol is not effective at this time.  Review of Systems - Negative except pain in left lower arm and bilateral leg pain   Allergies  Allergen Reactions  . Quinine Derivatives Itching   Outpatient Encounter Prescriptions as of 08/12/2012  Medication Sig Dispense Refill  . FOLIC ACID PO Take by mouth.        Marland Kitchen HYDROcodone-acetaminophen (NORCO/VICODIN) 5-325 MG per tablet Take 1 tablet by mouth every 4 (four) hours as needed for pain.  60 tablet  0  . ibuprofen (ADVIL,MOTRIN) 800 MG tablet Take 800 mg by mouth every 6 (six) hours as needed for pain.      . MedroxyPROGESTERone Acetate (DEPO-PROVERA IM) Inject into the muscle every 3 (three) months.      . potassium chloride (K-DUR) 10 MEQ tablet Take 2 tablets (20 mEq total) by mouth 2 (two) times daily.  30 tablet  1  . traMADol (ULTRAM) 50 MG tablet Take 1 tablet (50 mg total) by mouth every 8 (eight) hours as needed for pain.  90 tablet  0   No facility-administered encounter medications on file as of 08/12/2012.     Physical Exam   Filed Vitals:   08/12/12 1339  BP: 121/85  Pulse: 92  Temp: 98.6 F (37 C)    General: Alert, awake, oriented x3, in no acute distress.  HEENT: [place]/AT PEERL, EOMI Neck: Trachea midline,  no masses, no thyromegal,y no JVD, no carotid bruit OROPHARYNX:  Moist, No exudate/ erythema/lesions.   Heart: Regular rate and rhythm, without murmurs, rubs, gallops, PMI non-displaced, no heaves or thrills on palpation.  Lungs: Clear to auscultation, no wheezing or rhonchi noted. No increased vocal fremitus resonant to percussion  Abdomen: Soft, nontender, nondistended, positive bowel sounds, no masses no hepatosplenomegaly noted..  Neuro: No focal neurological deficits noted cranial nerves II through XII grossly intact. DTRs 2+ bilaterally upper and lower extremities. Strength 5 out of 5 in bilateral upper and lower extremities. Musculoskeletal: No warm swelling or erythema around joints, no spinal tenderness noted. Psychiatric: Patient alert and oriented x3, good insight and cognition, good recent to remote recall. Lymph node survey: No cervical axillary or inguinal lymphadenopathy noted.      Assessment/Plan:  Increase painful crisis secondary to situational stressors: Will change give a short course of [place] 5/325 prn # 30 and ibuprofen for inflammatory

## 2012-10-29 ENCOUNTER — Encounter: Payer: Self-pay | Admitting: Primary Care

## 2012-10-29 ENCOUNTER — Other Ambulatory Visit: Payer: Self-pay | Admitting: Primary Care

## 2012-10-29 ENCOUNTER — Ambulatory Visit (HOSPITAL_COMMUNITY)
Admission: AD | Admit: 2012-10-29 | Discharge: 2012-10-29 | Disposition: A | Discharge status: Home / Self Care | Payer: 59 | Source: Ambulatory Visit | Attending: Internal Medicine | Admitting: Internal Medicine

## 2012-10-29 ENCOUNTER — Ambulatory Visit (INDEPENDENT_AMBULATORY_CARE_PROVIDER_SITE_OTHER): Payer: 59 | Admitting: Primary Care

## 2012-10-29 VITALS — BP 120/87 | HR 92 | Temp 98.3°F | Resp 14 | Ht 59.0 in | Wt 126.0 lb

## 2012-10-29 DIAGNOSIS — D57819 Other sickle-cell disorders with crisis, unspecified: Secondary | ICD-10-CM

## 2012-10-29 DIAGNOSIS — G894 Chronic pain syndrome: Secondary | ICD-10-CM

## 2012-10-29 DIAGNOSIS — D5700 Hb-SS disease with crisis, unspecified: Secondary | ICD-10-CM

## 2012-10-29 DIAGNOSIS — N912 Amenorrhea, unspecified: Secondary | ICD-10-CM

## 2012-10-29 MED ORDER — HYDROCODONE-ACETAMINOPHEN 5-325 MG PO TABS: 1.0000 | ORAL_TABLET | ORAL | Status: DC | PRN

## 2012-10-29 MED ORDER — TRAMADOL HCL 50 MG PO TABS: 50.0000 mg | ORAL_TABLET | Freq: Three times a day (TID) | ORAL | Status: DC | PRN

## 2012-10-29 NOTE — Progress Notes (Signed)
SICKLE [facility]LL SERVICE PROGRESS NOTE  Patient ID: Tina Huber, female   DOB: [birth year], 37 y.o.   MRN: [id number]  PCP: Marthann Schiller, MD 10/29/2012   Chief Complaint  Patient presents with  . Pain    right side    . 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 She is in today for increased pain on left side and abdomen is tender to touch. Pt states this is her typical sickle cell pain. Rates her pain as 7/10 and her baseline 3/10. Her pain is describe as sharp ,stabbing and intermittent in nature. Provacative factors are stress aunt recently died with embolia and mother which stays with her having a difficulty time dealing with it and the changing in weather.  Palliative factors rest. She admits to being out of her Tramadol which is the reason why she is unable to tolerate her pain crisis. At this time her pain is not controlled on Tramadol and ibuprofen.  Review of Systems  Constitutional: Negative.   HENT: Negative.   Eyes: Negative.   Respiratory: Negative.   Cardiovascular: Negative.   Gastrointestinal: Negative.   Genitourinary: Negative.   Musculoskeletal: Negative.   Skin: Negative.   Neurological: Negative.   Psychiatric/Behavioral: Negative.      Allergies  Allergen Reactions  . Quinine Derivatives Itching     Outpatient Encounter Prescriptions as of 10/29/2012  Medication Sig Dispense Refill  . FOLIC ACID PO Take by mouth.        Marland Kitchen HYDROcodone-acetaminophen (NORCO/VICODIN) 5-325 MG per tablet Take 1 tablet by mouth every 4 (four) hours as needed for pain.  30 tablet  0  . ibuprofen (ADVIL,MOTRIN) 800 MG tablet Take 800 mg by mouth every 6 (six) hours as needed for pain.      . MedroxyPROGESTERone Acetate (DEPO-PROVERA IM) Inject into the muscle every 3 (three) months.      . traMADol (ULTRAM) 50 MG tablet Take 1 tablet (50 mg total) by mouth every 8 (eight) hours as needed for pain.  90 tablet  0  . potassium chloride  (K-DUR) 10 MEQ tablet Take 2 tablets (20 mEq total) by mouth 2 (two) times daily.  30 tablet  1   No facility-administered encounter medications on file as of 10/29/2012.     Physical Exam   Filed Vitals:   10/29/12 1302  BP: 120/87  Pulse: 92  Temp: 98.3 F (36.8 C)  Resp: 14    General: Alert, awake, oriented x3, in no acute distress.  HEENT: [place]/AT PEERL, EOMI Neck: Trachea midline,  no masses, no thyromegal,y no JVD, no carotid bruit OROPHARYNX:  Moist, No exudate/ erythema/lesions.  Heart: Regular rate and rhythm, without murmurs, rubs, gallops, PMI non-displaced, no heaves or thrills on palpation.  Lungs: Clear to auscultation, no wheezing or rhonchi noted. No increased vocal fremitus resonant to percussion  Abdomen: Soft, nontender, nondistended, positive bowel sounds, no masses no hepatosplenomegaly noted..  Neuro: No focal neurological deficits noted cranial nerves II through XII grossly intact.  Musculoskeletal: No warm swelling or erythema around joints, no spinal tenderness noted. Psychiatric: Patient alert and oriented x3 Lymph node survey: No cervical lymphadenopathy noted.    Assessment/Plan:  Sickle pain: pt is out of Tramadol and ibuprofen was not helping to control painful crisis. Will refill Tramadol 50 mg change direction from 1-2 Q 8hrs prn. Add Norco 5/325 for at night and days off from work for pain management not control with ibuprofen  and Tramadol. Increase fluids 64 oz per hr onset during and after.  Acute on Chronic pain: Add Norco 5/325 1 Q 4 hrs prn #30 short term for painful crisis and management.     Labs: CBC with diff, CMET, ferritin, rectic, Ca, Mag and hemoglobin electrophoresis    20% time spent with for teaching of/with SCD   Gwinda Passe, NP-C

## 2012-10-30 ENCOUNTER — Other Ambulatory Visit: Payer: 59

## 2012-10-31 ENCOUNTER — Other Ambulatory Visit: Payer: 59 | Admitting: *Deleted

## 2012-10-31 DIAGNOSIS — N912 Amenorrhea, unspecified: Secondary | ICD-10-CM

## 2012-10-31 DIAGNOSIS — D5700 Hb-SS disease with crisis, unspecified: Secondary | ICD-10-CM

## 2012-10-31 LAB — RETICULOCYTES
RBC.: 4.08 MIL/uL (ref 3.87–5.11)
Retic Ct Pct: 1.5 % (ref 0.4–2.3)

## 2012-10-31 LAB — CBC WITH DIFFERENTIAL/PLATELET
Basophils Absolute: 0 10*3/uL (ref 0.0–0.1)
HCT: 36.2 % (ref 36.0–46.0)
Hemoglobin: 12.7 g/dL (ref 12.0–15.0)
Lymphocytes Relative: 50 % — ABNORMAL HIGH (ref 12–46)
Lymphs Abs: 2.3 10*3/uL (ref 0.7–4.0)
MCHC: 35.1 g/dL (ref 30.0–36.0)
Monocytes Absolute: 0.5 10*3/uL (ref 0.1–1.0)
Monocytes Relative: 11 % (ref 3–12)
Neutro Abs: 1.8 10*3/uL (ref 1.7–7.7)
Neutrophils Relative %: 37 % — ABNORMAL LOW (ref 43–77)
Platelets: 203 10*3/uL (ref 150–400)
RBC: 4.08 MIL/uL (ref 3.87–5.11)
RDW: 14.4 % (ref 11.5–15.5)
WBC: 4.7 10*3/uL (ref 4.0–10.5)

## 2012-10-31 LAB — CALCIUM, IONIZED: Calcium, Ion: 1.19 mmol/L (ref 1.12–1.32)

## 2012-11-01 LAB — COMPREHENSIVE METABOLIC PANEL
Albumin: 4 g/dL (ref 3.5–5.2)
BUN: 14 mg/dL (ref 6–23)
CO2: 28 mEq/L (ref 19–32)
Calcium: 8.8 mg/dL (ref 8.4–10.5)
Chloride: 103 mEq/L (ref 96–112)
Glucose, Bld: 96 mg/dL (ref 70–99)
Potassium: 4.1 mEq/L (ref 3.5–5.3)
Sodium: 136 mEq/L (ref 135–145)
Total Protein: 6.8 g/dL (ref 6.0–8.3)

## 2012-11-01 LAB — MAGNESIUM: Magnesium: 1.8 mg/dL (ref 1.5–2.5)

## 2012-11-01 LAB — PREGNANCY, URINE: Preg Test, Ur: NEGATIVE

## 2012-11-04 LAB — HEMOGLOBINOPATHY EVALUATION
Hgb A2 Quant: 3.4 % — ABNORMAL HIGH (ref 2.2–3.2)
Hgb A: 54.8 % — ABNORMAL LOW (ref 96.8–97.8)
Hgb F Quant: 1.7 % (ref 0.0–2.0)

## 2012-11-05 DIAGNOSIS — G894 Chronic pain syndrome: Secondary | ICD-10-CM | POA: Insufficient documentation

## 2012-11-05 DIAGNOSIS — D5700 Hb-SS disease with crisis, unspecified: Secondary | ICD-10-CM | POA: Insufficient documentation

## 2013-01-06 ENCOUNTER — Ambulatory Visit: Payer: 59 | Admitting: Internal Medicine

## 2013-01-13 ENCOUNTER — Telehealth (HOSPITAL_COMMUNITY): Payer: Self-pay | Admitting: Hematology

## 2013-01-13 ENCOUNTER — Ambulatory Visit: Payer: 59 | Admitting: Internal Medicine

## 2013-01-13 ENCOUNTER — Encounter (HOSPITAL_COMMUNITY): Payer: Self-pay | Admitting: Hematology

## 2013-01-13 ENCOUNTER — Encounter: Payer: Self-pay | Admitting: Internal Medicine

## 2013-01-13 ENCOUNTER — Non-Acute Institutional Stay (HOSPITAL_COMMUNITY)
Admission: AD | Admit: 2013-01-13 | Discharge: 2013-01-13 | Disposition: A | Discharge status: Home / Self Care | Payer: 59 | Attending: Internal Medicine | Admitting: Internal Medicine

## 2013-01-13 DIAGNOSIS — R109 Unspecified abdominal pain: Secondary | ICD-10-CM | POA: Insufficient documentation

## 2013-01-13 DIAGNOSIS — D57819 Other sickle-cell disorders with crisis, unspecified: Secondary | ICD-10-CM

## 2013-01-13 DIAGNOSIS — Z79899 Other long term (current) drug therapy: Secondary | ICD-10-CM | POA: Insufficient documentation

## 2013-01-13 DIAGNOSIS — G894 Chronic pain syndrome: Secondary | ICD-10-CM

## 2013-01-13 DIAGNOSIS — R079 Chest pain, unspecified: Secondary | ICD-10-CM | POA: Insufficient documentation

## 2013-01-13 DIAGNOSIS — M79609 Pain in unspecified limb: Secondary | ICD-10-CM | POA: Insufficient documentation

## 2013-01-13 DIAGNOSIS — D5700 Hb-SS disease with crisis, unspecified: Secondary | ICD-10-CM | POA: Insufficient documentation

## 2013-01-13 LAB — COMPREHENSIVE METABOLIC PANEL
ALT: 12 U/L (ref 0–35)
AST: 19 U/L (ref 0–37)
Albumin: 3.9 g/dL (ref 3.5–5.2)
CO2: 24 mEq/L (ref 19–32)
Calcium: 9.1 mg/dL (ref 8.4–10.5)
Chloride: 99 mEq/L (ref 96–112)
Creatinine, Ser: 0.67 mg/dL (ref 0.50–1.10)
GFR calc Af Amer: >90 mL/min (ref >90)
GFR calc non Af Amer: >90 mL/min (ref >90)
Glucose, Bld: 100 mg/dL — ABNORMAL HIGH (ref 70–99)
Total Bilirubin: 0.4 mg/dL (ref 0.3–1.2)

## 2013-01-13 LAB — CBC WITH DIFFERENTIAL/PLATELET
Basophils Absolute: 0 10*3/uL (ref 0.0–0.1)
Basophils Relative: 0 % (ref 0–1)
Eosinophils Absolute: 0 10*3/uL (ref 0.0–0.7)
HCT: 36.6 % (ref 36.0–46.0)
Hemoglobin: 12.9 g/dL (ref 12.0–15.0)
Lymphocytes Relative: 50 % — ABNORMAL HIGH (ref 12–46)
MCH: 30.6 pg (ref 26.0–34.0)
MCHC: 35.2 g/dL (ref 30.0–36.0)
Monocytes Absolute: 0.3 10*3/uL (ref 0.1–1.0)
Neutro Abs: 1.5 10*3/uL — ABNORMAL LOW (ref 1.7–7.7)
Neutrophils Relative %: 40 % — ABNORMAL LOW (ref 43–77)
Platelets: 206 10*3/uL (ref 150–400)
WBC: 3.6 10*3/uL — ABNORMAL LOW (ref 4.0–10.5)

## 2013-01-13 LAB — URINALYSIS, ROUTINE W REFLEX MICROSCOPIC
Bilirubin Urine: NEGATIVE
Hgb urine dipstick: NEGATIVE
Ketones, ur: NEGATIVE mg/dL
Protein, ur: NEGATIVE mg/dL
Urobilinogen, UA: 0.2 mg/dL (ref 0.0–1.0)

## 2013-01-13 LAB — RETICULOCYTES
RBC.: 4.21 MIL/uL (ref 3.87–5.11)
Retic Count, Absolute: 58.9 10*3/uL (ref 19.0–186.0)
Retic Ct Pct: 1.4 % (ref 0.4–3.1)

## 2013-01-13 MED ORDER — ONDANSETRON HCL 4 MG PO TABS: 4.0000 mg | ORAL_TABLET | Freq: Three times a day (TID) | ORAL | Status: DC | PRN

## 2013-01-13 MED ORDER — ONDANSETRON HCL 4 MG/2ML IJ SOLN
4.0000 mg | Freq: Four times a day (QID) | INTRAMUSCULAR | Status: DC | PRN
  Administered 2013-01-13: 4 mg via INTRAVENOUS
  Filled 2013-01-13: qty 2

## 2013-01-13 MED ORDER — DEXTROSE-NACL 5-0.45 % IV SOLN
INTRAVENOUS | Status: DC
  Administered 2013-01-13: 11:00:00 via INTRAVENOUS

## 2013-01-13 MED ORDER — SODIUM CHLORIDE 0.9 % IJ SOLN: 9.0000 mL | INTRAMUSCULAR | Status: DC | PRN

## 2013-01-13 MED ORDER — FOLIC ACID 1 MG PO TABS
1.0000 mg | ORAL_TABLET | Freq: Every day | ORAL | Status: DC
  Administered 2013-01-13: 1 mg via ORAL
  Filled 2013-01-13: qty 1

## 2013-01-13 MED ORDER — HYDROCODONE-ACETAMINOPHEN 10-325 MG PO TABS
1.0000 | ORAL_TABLET | Freq: Once | ORAL | Status: AC
  Administered 2013-01-13: 1 via ORAL
  Filled 2013-01-13: qty 1

## 2013-01-13 MED ORDER — HYDROMORPHONE HCL PF 2 MG/ML IJ SOLN
1.2500 mg | Freq: Once | INTRAMUSCULAR | Status: AC
  Administered 2013-01-13: 1.25 mg via INTRAVENOUS
  Filled 2013-01-13: qty 1

## 2013-01-13 MED ORDER — HYDROMORPHONE HCL PF 2 MG/ML IJ SOLN
INTRAMUSCULAR | Status: AC
  Filled 2013-01-13: qty 1

## 2013-01-13 MED ORDER — HYDROMORPHONE HCL PF 2 MG/ML IJ SOLN
1.0000 mg | Freq: Once | INTRAMUSCULAR | Status: AC
  Administered 2013-01-13: 1 mg via INTRAVENOUS
  Filled 2013-01-13: qty 1

## 2013-01-13 MED ORDER — HYDROMORPHONE 0.3 MG/ML IV SOLN
INTRAVENOUS | Status: DC
  Administered 2013-01-13: 14:00:00 via INTRAVENOUS
  Filled 2013-01-13: qty 25

## 2013-01-13 MED ORDER — IBUPROFEN 800 MG PO TABS: 800.0000 mg | ORAL_TABLET | Freq: Four times a day (QID) | ORAL | Status: DC | PRN

## 2013-01-13 MED ORDER — HYDROMORPHONE HCL PF 2 MG/ML IJ SOLN
1.0000 mg | Freq: Once | INTRAMUSCULAR | Status: AC
  Administered 2013-01-13: 1 mg via INTRAVENOUS

## 2013-01-13 MED ORDER — HYDROCODONE-ACETAMINOPHEN 5-325 MG PO TABS: 1.0000 | ORAL_TABLET | ORAL | Status: DC | PRN

## 2013-01-13 MED ORDER — ONDANSETRON HCL 4 MG PO TABS
4.0000 mg | ORAL_TABLET | ORAL | Status: DC | PRN
  Administered 2013-01-13: 4 mg via ORAL
  Filled 2013-01-13: qty 1

## 2013-01-13 MED ORDER — NALOXONE HCL 0.4 MG/ML IJ SOLN: 0.4000 mg | INTRAMUSCULAR | Status: DC | PRN

## 2013-01-13 MED ORDER — KETOROLAC TROMETHAMINE 30 MG/ML IJ SOLN
30.0000 mg | Freq: Once | INTRAMUSCULAR | Status: AC
  Administered 2013-01-13: 30 mg via INTRAVENOUS
  Filled 2013-01-13: qty 1

## 2013-01-13 NOTE — Discharge Summary (Signed)
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 Pt states that she took her Tramadol and still had no significant relief. She describes that pain as cramping in nature and states that she has had several episodes of pain in the past few months which she has been able to treat with oral medications. THe intensity of pain is 7/10 and patient states that her baseline is 1-2/10. The pain is non-radiating. She is unable to identify any palliative or provocative features.  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 She reported that she had been transfused frequently as a child and was unsure of what interventions took place as she was treated in [place]. Subsequent electrophoresis are consistent with trait in te absence of blood transfusions. A Beta globin analysis as recommended to the NP that has been following her has not been performed   Hospital Course:  Sickle Cell Disease with acute pain: Pt was treated as Sickle Cell Pain crisis. She was treated with IV analgesics, Toradol and heating pad. Her pain went from 8/10 to 3/10 at time of discharge. She was transitioned to oral medications and pain remained at 3/10. Pt was  discharged to home in stable condition.   Pt's Hb electrophoresis is consistent with trait rather than Hb SS. I have ordered a  complete B-globin analysis and consider referral to Hematologist.  Pt will need to follow up in the clinic to further explore her pain if this is in fact not Hb SS disease.      Discharge Exam: Filed Vitals:   01/13/13 1600  BP: 89/50  Pulse:   Temp: 97.6 F (36.4 C)  Resp: 12   Filed Vitals:   01/13/13 1418 01/13/13 1419 01/13/13 1500 01/13/13 1600  BP:   89/59 89/50  Pulse: 63  65   Temp:    97.6 F (36.4 C)  TempSrc:      Resp:   10 12  Height:      Weight:      SpO2: 84% 98% 100% 100%  General: Alert, awake, oriented x3, in no acute distress.  HEENT: [place]/AT [place]ER[place], EOMI. anicteric.  Heart: Regular rate and rhythm, without murmurs, rubs, gallops.  Lungs: Clear to auscultation, no wheezing or rhonchi noted. No pleuritic pain.  Abdomen: Soft, nontender, nondistended, positive bowel sounds, no masses no hepatosplenomegaly noted.  Neuro: No focal neurological deficits noted cranial nerves II through XII grossly intact. Strength functional in bilateral upper and lower extremities.  Musculoskeletal: No warm swelling or erythema around joints, no spinal tenderness noted.  Psychiatric: Patient alert and oriented x3, good insight and cognition, good recent to remote recall.       Discharge Instructions   Future Appointments Provider Department Dept Phone   01/19/2013 2:00 PM Altha Harm, MD [place] [facility] [phone]  Medication List    STOP taking these medications       traMADol 50 MG tablet  Commonly known as:  ULTRAM      TAKE these medications       DEPO-PROVERA IM  Inject into the muscle every 3 (three) months.     FOLIC ACID PO  Take by mouth.     HYDROcodone-acetaminophen 5-325 MG per tablet  Commonly known as:  NORCO/VICODIN  Take 1 tablet by mouth every 4 (four) hours as needed.     ibuprofen 800 MG tablet  Commonly known as:  ADVIL,MOTRIN  Take 1 tablet (800 mg total) by mouth every 6 (six) hours as needed.      potassium chloride 10 MEQ tablet  Commonly known as:  K-DUR  Take 2 tablets (20 mEq total) by mouth 2 (two) times daily.          The results of significant diagnostics from this hospitalization (including imaging, microbiology, ancillary and laboratory) are listed below for reference.    Labs: Basic Metabolic Panel:  Recent Labs Lab 01/13/13 1106  NA 133*  K 3.6  CL 99  CO2 24  GLUCOSE 100*  BUN 9  CREATININE 0.67  CALCIUM 9.1   Liver Function Tests:  Recent Labs Lab 01/13/13 1106  AST 19  ALT 12  ALKPHOS 50  BILITOT 0.4  PROT 7.9  ALBUMIN 3.9   No results found for this basename: LIPASE, AMYLASE,  in the last 168 hours No results found for this basename: AMMONIA,  in the last 168 hours CBC:  Recent Labs Lab 01/13/13 1106  WBC 3.6*  NEUTROABS 1.5*  HGB 12.9  HCT 36.6  MCV 86.9  PLT 206    Time coordinating discharge: 38 minutes  Signed:  MATTHEWS,MICHELLE A.  01/13/2013, 4:42 PM

## 2013-01-13 NOTE — Progress Notes (Signed)
[facility] History and Physical  Tina Huber AVW:[id number] DOB: 05-22-1975 DOA: 01/13/2013   PCP: Brandom Kerwin A., MD   Chief Complaint: Pain in legs, chest and supra pubic region for several days.  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 Pt states that she took her Tramadol and still had no significant relief.  She describes that pain as cramping in nature and  states that she has had several episodes of pain in the past few months which she has been able to treat with oral medications. THe intensity of pain is 7/10 and patient states that her baseline is 1-2/10. The pain is non-radiating. She is unable to identify any palliative or provocative features.   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 She reported that she had been transfused frequently as a child and was unsure of what interventions took place as she was treated in [place]. Subsequent electrophoresis are consistent with trait in te absence of blood transfusions. A Beta globin analysis as recommended to the NP that has been following her has not been performed.    Review of Systems:  Constitutional: No weight loss, night sweats, Fevers, chills, fatigue.  HEENT: No headaches, dizziness, seizures, vision changes, difficulty swallowing,Tooth/dental problems,Sore throat, No sneezing, itching, ear ache, nasal congestion, post nasal drip,  Cardio-vascular: No chest pain, Orthopnea, PND, swelling in lower extremities, anasarca, dizziness, palpitations  GI: No heartburn, indigestion, abdominal pain, nausea, vomiting, diarrhea, change in bowel habits, loss of appetite  Resp: No shortness of breath with exertion or at rest. No excess mucus, no productive cough, No non-productive cough, No coughing up of blood.No change in  color of mucus.No wheezing.No chest wall deformity  Skin: no rash or lesions.  GU: no dysuria, change in color of urine, no urgency or frequency. No flank pain.  Psych: No change in mood or affect. No depression or anxiety. No memory loss.    Past Medical History  Diagnosis Date  . Sickle cell anemia    No past surgical history on file. Social History:  reports that she has never smoked. She does not have any smokeless tobacco history on file. She reports that she does not drink alcohol. Her drug history is not on file.  Allergies  Allergen Reactions  . Quinine Derivatives Itching    Family History  Problem Relation Age of Onset  . Stroke Brother   . Diabetes Paternal Uncle     Prior to Admission medications   Medication Sig Start Date End Date Taking? Authorizing Provider  FOLIC ACID PO Take by mouth.     Yes Historical Provider, MD  HYDROcodone-acetaminophen (NORCO/VICODIN) 5-325 MG per tablet Take 1 tablet by mouth every 4 (four) hours as needed for pain. 10/29/12  Yes Grayce Sessions, NP  ibuprofen (ADVIL,MOTRIN) 800 MG tablet Take 800 mg by mouth every 6 (six) hours as needed for pain.   Yes Historical Provider, MD  MedroxyPROGESTERone Acetate (DEPO-PROVERA IM) Inject into the muscle every 3 (three) months.   Yes Historical Provider, MD  potassium chloride (K-DUR) 10 MEQ tablet Take 2 tablets (20 mEq total) by mouth 2 (two) times daily. 01/15/12  Yes Grayce Sessions, NP  traMADol (ULTRAM) 50 MG tablet Take 50 mg by mouth every 8 (eight) hours as  needed for pain. 1-2 as needed for pain 06/13/12  Yes Grayce Sessions, NP  traMADol (ULTRAM) 50 MG tablet Take 1 tablet (50 mg total) by mouth every 8 (eight) hours as needed for pain. 10/29/12  Yes Grayce Sessions, NP   Physical Exam: Filed Vitals:   01/13/13 1051  BP: 111/84  Pulse: 85  Temp: 98.3 F (36.8 C)  TempSrc: Oral  Resp: 18  Height: 5' (1.524 m)  Weight: 126 lb (57.153 kg)  SpO2: 100%   BP 90/59  Pulse  63  Temp(Src) 97.6 F (36.4 C) (Oral)  Resp 10  Ht 5' (1.524 m)  Wt 126 lb (57.153 kg)  BMI 24.61 kg/m2  SpO2 98%  LMP 12/18/2012  General Appearance:    Alert, cooperative, mild distress secondary to pain, appears stated age  Head:    Normocephalic, without obvious abnormality, atraumatic  Eyes:    PERRL, conjunctiva/corneas clear, EOM's intact, fundi    benign, both eyes anicteric.  Neck:   Supple, symmetrical, trachea midline, no adenopathy;    thyroid:  no enlargement/tenderness/nodules; no carotid   bruit or JVD  Back:     Symmetric, no curvature, ROM normal, no CVA tenderness  Lungs:     Clear to auscultation bilaterally, respirations unlabored  Chest Wall:    No tenderness or deformity   Heart:    Regular rate and rhythm, S1 and S2 normal, no murmur, rub   or gallop  Abdomen:     Soft, mild supra-pubic tenderness, bowel sounds active all four quadrants,    no masses, no organomegaly  Extremities:   Extremities normal, atraumatic, no cyanosis or edema  Pulses:   2+ and symmetric all extremities  Skin:   Skin color, texture, turgor normal, no rashes or lesions  Lymph nodes:   Cervical, supraclavicular, and axillary nodes normal  Neurologic:   CNII-XII intact, normal strength, sensation and reflexes    throughout    Labs on Admission:   Basic Metabolic Panel:  Recent Labs Lab 01/13/13 1106  NA 133*  K 3.6  CL 99  CO2 24  GLUCOSE 100*  BUN 9  CREATININE 0.67  CALCIUM 9.1   Liver Function Tests:  Recent Labs Lab 01/13/13 1106  AST 19  ALT 12  ALKPHOS 50  BILITOT 0.4  PROT 7.9  ALBUMIN 3.9   CBC:  Recent Labs Lab 01/13/13 1106  WBC 3.6*  NEUTROABS 1.5*  HGB 12.9  HCT 36.6  MCV 86.9  PLT 206     Assessment/Plan: Active Problems: 1. Sickle Cell Disease with acute pain: Will treat as Sickle Cell Pain crisis. Pt will be treated with IV analgesics and expect that she will be transitioned to oral medications and be able to discharge to home.    Pt's Hb electrophoresis is consistent with trait rather than Hb SS. I have ordered a complete B-globin analysis and consider referral to Hematologist.   Pt will need to follow up in the clinic to further explore her pain if this is in fact not Hb SS disease.   Time spend: 40 minutesd Code Status: Full Code Family Communication: N/A Disposition Plan: Home at discharge.  Caydence Koenig A., MD  Pager [phone]  If 7PM-7AM, please contact night-coverage [email or web address] Password [facility] 01/13/2013, 12:27 PM

## 2013-01-13 NOTE — Telephone Encounter (Signed)
Patient called in C/O legs and side hurting and severe headache that she rates 8/10.  Patient denies fever, nausea or vomiting, or abdominal pain.  Patient does state that chest is sore when breathing at times, but denies shortness of breath.  Patient states she has not taken any vicodin in about 3weeks, but took tramadol yesterday.  I explained that I would notify the physician and give her a call back.  Patient verbalizes understanding

## 2013-01-13 NOTE — Progress Notes (Signed)
Patient nauseated and dry-heaving.  MD notified that PO zofran given at 11:30.  Per MD ok to give a dose via IV route.  RN will continue to monitor the patient

## 2013-01-13 NOTE — Telephone Encounter (Signed)
Called patient back in regards to her being in pain and wanting to come to [facility].  Spoke with Dr. Ashley Royalty via telephone, and it is ok for patient to come to day hospital.  Patient verbalizes understanding.

## 2013-01-13 NOTE — Progress Notes (Signed)
Patient ID: Tina Huber, female   DOB: 08-21-1975, 37 y.o.   MRN: [id number] Discharge instructions given to patient, along with follow up information.  Prescription to give patient for Vicodin.  Questions answered.  Pocketbook, cellphone and clothing returned to patient.

## 2013-01-14 NOTE — H&P (Signed)
H&P erroneously filed under progress note. Note being copied as is under appropriate heading.  [facility] History and Physical   Tina Huber WUJ:[id number] DOB: [birth year] DOA: 01/13/2013     PCP: Aria Jarrard A., MD    Chief Complaint: Pain in legs, chest and supra pubic region for several days.   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 Pt states that she took her Tramadol and still had no significant relief.  She describes that pain as cramping in nature and  states that she has had several episodes of pain in the past few months which she has been able to treat with oral medications. THe intensity of pain is 7/10 and patient states that her baseline is 1-2/10. The pain is non-radiating. She is unable to identify any palliative or provocative features.    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 She reported that she had been transfused frequently as a child and was unsure of what interventions took place as she was treated in [place]. Subsequent electrophoresis are consistent with trait in te absence of blood transfusions. A Beta globin analysis as recommended to the NP that has been following her has not been performed.      Review of Systems:  Constitutional: No weight loss, night sweats, Fevers, chills, fatigue.   HEENT: No headaches, dizziness, seizures, vision changes, difficulty swallowing,Tooth/dental problems,Sore throat, No sneezing, itching, ear ache, nasal congestion, post nasal drip,   Cardio-vascular: No chest pain, Orthopnea, PND, swelling in lower extremities, anasarca, dizziness, palpitations   GI: No heartburn, indigestion, abdominal pain, nausea, vomiting, diarrhea, change in bowel habits, loss of appetite   Resp: No shortness of breath with  exertion or at rest. No excess mucus, no productive cough, No non-productive cough, No coughing up of blood.No change in color of mucus.No wheezing.No chest wall deformity   Skin: no rash or lesions.   GU: no dysuria, change in color of urine, no urgency or frequency. No flank pain.   Psych: No change in mood or affect. No depression or anxiety. No memory loss.       Past Medical History   Diagnosis  Date   .  Sickle cell anemia      No past surgical history on file. Social History: reports that she has never smoked. She does not have any smokeless tobacco history on file. She reports that she does not drink alcohol. Her drug history is not on file.    Allergies   Allergen  Reactions   .  Quinine Derivatives  Itching       Family History   Problem  Relation  Age of Onset   .  Stroke  Brother     .  Diabetes  Paternal Uncle         Prior to Admission medications    Medication  Sig  Start Date  End Date  Taking?  Authorizing Provider   FOLIC ACID PO  Take by mouth.        Yes  Historical Provider, MD   HYDROcodone-acetaminophen (NORCO/VICODIN) 5-325 MG per tablet  Take 1 tablet by mouth every 4 (four) hours as needed for pain.  10/29/12    Yes  Grayce Sessions, NP   ibuprofen (ADVIL,MOTRIN) 800 MG  tablet  Take 800 mg by mouth every 6 (six) hours as needed for pain.      Yes  Historical Provider, MD   MedroxyPROGESTERone Acetate (DEPO-PROVERA IM)  Inject into the muscle every 3 (three) months.      Yes  Historical Provider, MD   potassium chloride (K-DUR) 10 MEQ tablet  Take 2 tablets (20 mEq total) by mouth 2 (two) times daily.  01/15/12    Yes  Grayce Sessions, NP   traMADol (ULTRAM) 50 MG tablet  Take 50 mg by mouth every 8 (eight) hours as needed for pain. 1-2 as needed for pain  06/13/12    Yes  Grayce Sessions, NP   traMADol (ULTRAM) 50 MG tablet  Take 1 tablet (50 mg total) by mouth every 8 (eight) hours as needed for pain.  10/29/12    Yes  Grayce Sessions, NP       Physical Exam: Filed Vitals:     01/13/13 1051   BP:  111/84   Pulse:  85   Temp:  98.3 F (36.8 C)   TempSrc:  Oral   Resp:  18   Height:  5' (1.524 m)   Weight:  126 lb (57.153 kg)   SpO2:  100%    BP 90/59  Pulse 63  Temp(Src) 97.6 F (36.4 C) (Oral)  Resp 10  Ht 5' (1.524 m)  Wt 126 lb (57.153 kg)  BMI 24.61 kg/m2  SpO2 98%  LMP 12/18/2012    General Appearance:     Alert, cooperative, mild distress secondary to pain, appears stated                                         age                             Head:     Normocephalic, without obvious abnormality, atraumatic                              Eyes:    PERRL, conjunctiva/corneas clear, EOM's intact, fundi                                            benign, both eyes anicteric.                               Neck:   Supple, symmetrical, trachea midline, no adenopathy;                                            thyroid:  no enlargement/tenderness/nodules; no carotid                                          bruit or JVD  Back:     Symmetric, no curvature, ROM normal, no CVA tenderness                           Lungs:      Clear to auscultation bilaterally, respirations unlabored                    Chest Wall:     No tenderness or deformity                             Heart:     Regular rate and rhythm, S1 and S2 normal, no murmur, rub   or                                           gallop                     Abdomen:      Soft, mild supra-pubic tenderness, bowel sounds active all four                                               quadrants,  no masses, no organomegaly                     Extremities:    Extremities normal, atraumatic, no cyanosis or edema                            Pulses:    2+ and symmetric all extremities                                Skin:    Skin color, texture, turgor normal, no rashes or lesions                 Lymph nodes:    Cervical, supraclavicular, and axillary  nodes normal                      Neurologic:    CNII-XII intact, normal strength, sensation and reflexes                                            throughout        Labs on Admission:    Basic Metabolic Panel: Recent Labs Lab  01/13/13 1106   NA  133*   K  3.6   CL  99   CO2  24   GLUCOSE  100*   BUN  9   CREATININE  0.67   CALCIUM  9.1    Liver Function Tests: Recent Labs Lab  01/13/13 1106   AST  19   ALT  12   ALKPHOS  50   BILITOT  0.4   PROT  7.9   ALBUMIN  3.9    CBC: Recent Labs Lab  01/13/13 1106   WBC  3.6*   NEUTROABS  1.5*   HGB  12.9   HCT  36.6   MCV  86.9   PLT  206        Assessment/Plan: Active Problems: 1. Sickle Cell Disease with acute pain: Will treat as Sickle Cell Pain crisis. Pt will be treated with IV analgesics and expect that she will be transitioned to oral medications and be able to discharge to home.    Pt's Hb electrophoresis is consistent with trait rather than Hb SS. I have ordered a complete B-globin analysis and consider referral to Hematologist.    Pt will need to follow up in the clinic to further explore her pain if this is in fact not Hb SS disease.     Time spend: 40 minutesd Code Status: Full Code Family Communication: N/A Disposition Plan: Home at discharge.   Burma Ketcher A., MD          Pager [phone]   If 7PM-7AM, please contact night-coverage [email or web address] Password [facility] 01/13/2013, 12:27 PM

## 2013-01-19 ENCOUNTER — Ambulatory Visit (INDEPENDENT_AMBULATORY_CARE_PROVIDER_SITE_OTHER): Payer: 59 | Admitting: Internal Medicine

## 2013-01-19 ENCOUNTER — Other Ambulatory Visit: Payer: 59

## 2013-01-19 ENCOUNTER — Encounter: Payer: Self-pay | Admitting: Internal Medicine

## 2013-01-19 DIAGNOSIS — M25559 Pain in unspecified hip: Secondary | ICD-10-CM

## 2013-01-19 DIAGNOSIS — G894 Chronic pain syndrome: Secondary | ICD-10-CM

## 2013-01-19 DIAGNOSIS — D573 Sickle-cell trait: Secondary | ICD-10-CM

## 2013-01-19 NOTE — Progress Notes (Signed)
   Subjective:    Patient ID: Tina Huber, female    DOB: 1975/05/18, 37 y.o.   MRN: [id number]  HPI: Pt here to follow up after being seen for acute pain syndrome. She states that she is unable to perform her job while taking her pain medication as it causes her to be drowsy. Pt has no other complaints at present.     Review of Systems  Constitutional: Negative.   HENT: Negative.   Eyes: Negative.   Respiratory: Negative.   Cardiovascular: Negative.   Gastrointestinal: Negative.   Endocrine: Negative.   Genitourinary: Negative.   Musculoskeletal: Positive for arthralgias and myalgias.  Skin: Negative.   Allergic/Immunologic: Negative.   Neurological: Negative.   Hematological: Negative.   Psychiatric/Behavioral: Negative.        Objective:   Physical Exam  Constitutional: She is oriented to person, place, and time. She appears well-developed and well-nourished.  HENT:  Head: Normocephalic and atraumatic.  Eyes: Conjunctivae and EOM are normal. Pupils are equal, round, and reactive to light. No scleral icterus.  Neck: Normal range of motion. Neck supple. No JVD present. No thyromegaly present.  Cardiovascular: Normal rate and regular rhythm.  Exam reveals no gallop and no friction rub.   No murmur heard. Pulmonary/Chest: Effort normal and breath sounds normal. She has no wheezes. She has no rales.  Abdominal: Soft. Bowel sounds are normal. She exhibits no distension and no mass. There is no tenderness.  Musculoskeletal: Normal range of motion.  Neurological: She is alert and oriented to person, place, and time. No cranial nerve deficit.  Skin: Skin is warm and dry.  Psychiatric: She has a normal mood and affect. Her behavior is normal. Judgment and thought content normal.          Assessment & Plan:  1. Pt here with BLE pain in a setting of Scikle Cell Trait. She states that she has pain in BLE's that occurs in the bone. Her calcium levels are normal and she takes no  medications with known side effects of bone pain. I have spoken with Dr. Sherwood Gambler at [facility] who will see the patient in consult. He also believes the patient to have trait and notes that he has had a handful of patients with trait who report pain.   2. Pain:  Etiology unknown. Pt unable to work while taking medication. Will keep off work for the next week. An re-evaluate. Continue pain.medication. Pt to be re-evaluated next week.

## 2013-01-20 ENCOUNTER — Telehealth: Payer: Self-pay | Admitting: Internal Medicine

## 2013-01-20 ENCOUNTER — Other Ambulatory Visit: Payer: 59 | Admitting: *Deleted

## 2013-01-20 NOTE — Telephone Encounter (Signed)
Note states that patient will be evaluated after 01/26/2013. Thus note OK for appointment on 01/27/2013.

## 2013-01-21 LAB — VITAMIN D 25 HYDROXY (VIT D DEFICIENCY, FRACTURES): Vit D, 25-Hydroxy: 15 ng/mL — ABNORMAL LOW (ref 30–89)

## 2013-01-27 ENCOUNTER — Ambulatory Visit: Payer: 59 | Admitting: Internal Medicine

## 2013-01-27 ENCOUNTER — Encounter: Payer: Self-pay | Admitting: Internal Medicine

## 2013-01-27 VITALS — BP 105/83 | HR 96 | Temp 98.2°F | Resp 16 | Ht 60.25 in | Wt 136.0 lb

## 2013-01-27 DIAGNOSIS — Z23 Encounter for immunization: Secondary | ICD-10-CM

## 2013-01-27 DIAGNOSIS — E559 Vitamin D deficiency, unspecified: Secondary | ICD-10-CM | POA: Insufficient documentation

## 2013-01-27 DIAGNOSIS — D5700 Hb-SS disease with crisis, unspecified: Secondary | ICD-10-CM

## 2013-01-27 DIAGNOSIS — Z283 Underimmunization status: Secondary | ICD-10-CM

## 2013-01-27 MED ORDER — VITAMIN D (ERGOCALCIFEROL) 1.25 MG (50000 UNIT) PO CAPS: 50000.0000 [IU] | ORAL_CAPSULE | ORAL | Status: DC

## 2013-01-27 MED ORDER — HYDROCODONE-ACETAMINOPHEN 5-325 MG PO TABS: 1.0000 | ORAL_TABLET | ORAL | Status: DC | PRN

## 2013-01-27 NOTE — Progress Notes (Unsigned)
   Subjective:    Patient ID: Tina Huber, female    DOB: 15-Oct-1975, 37 y.o.   MRN: [id number]  HPI    Review of Systems     Objective:   Physical Exam        Assessment & Plan:   1. Episodic Pain: Pt has had episodic pain which incapacitates her. Her Hb electrophoresis is consistent with Hb SA (Sickle Cell trait) which does not usually have a clincal presentation of pain. I have spoken with Dr. Sherwood Gambler (Hemetology) and a making a referral to see the patient in consultation.     2. Vitamin D deficiency: Will start on Ergocalciferol 50,000 U weekly. Will re-check vitamin D in 3 months.  3. Immunization: Pt to receive Influenza vaccine today.

## 2013-02-12 ENCOUNTER — Encounter: Payer: Self-pay | Admitting: Internal Medicine

## 2013-02-12 DIAGNOSIS — M898X5 Other specified disorders of bone, thigh: Secondary | ICD-10-CM | POA: Insufficient documentation

## 2013-02-18 ENCOUNTER — Telehealth: Payer: Self-pay | Admitting: Internal Medicine

## 2013-02-18 LAB — MISCELLANEOUS TEST: Miscellaneous Test: 14974

## 2013-02-18 NOTE — Telephone Encounter (Signed)
Called patient to schedule annual CPE. Patient will call back with appointment date.

## 2013-06-26 ENCOUNTER — Encounter (HOSPITAL_BASED_OUTPATIENT_CLINIC_OR_DEPARTMENT_OTHER): Payer: Self-pay | Admitting: Emergency Medicine

## 2013-06-26 ENCOUNTER — Emergency Department (HOSPITAL_BASED_OUTPATIENT_CLINIC_OR_DEPARTMENT_OTHER)
Admission: EM | Admit: 2013-06-26 | Discharge: 2013-06-26 | Disposition: A | Discharge status: Home / Self Care | Payer: 59 | Attending: Emergency Medicine | Admitting: Emergency Medicine

## 2013-06-26 ENCOUNTER — Emergency Department (HOSPITAL_BASED_OUTPATIENT_CLINIC_OR_DEPARTMENT_OTHER): Payer: 59

## 2013-06-26 DIAGNOSIS — Z3202 Encounter for pregnancy test, result negative: Secondary | ICD-10-CM | POA: Insufficient documentation

## 2013-06-26 DIAGNOSIS — Z79899 Other long term (current) drug therapy: Secondary | ICD-10-CM | POA: Insufficient documentation

## 2013-06-26 DIAGNOSIS — D573 Sickle-cell trait: Secondary | ICD-10-CM | POA: Insufficient documentation

## 2013-06-26 LAB — RETICULOCYTES
RBC.: 4.08 MIL/uL (ref 3.87–5.11)
RETIC COUNT ABSOLUTE: 49 10*3/uL (ref 19.0–186.0)
RETIC CT PCT: 1.2 % (ref 0.4–3.1)

## 2013-06-26 LAB — CBC WITH DIFFERENTIAL/PLATELET
BASOS PCT: 0 % (ref 0–1)
Basophils Absolute: 0 10*3/uL (ref 0.0–0.1)
Eosinophils Absolute: 0.1 10*3/uL (ref 0.0–0.7)
Eosinophils Relative: 2 % (ref 0–5)
HCT: 35.6 % — ABNORMAL LOW (ref 36.0–46.0)
HEMOGLOBIN: 12.7 g/dL (ref 12.0–15.0)
Lymphocytes Relative: 49 % — ABNORMAL HIGH (ref 12–46)
Lymphs Abs: 2.7 10*3/uL (ref 0.7–4.0)
MCH: 31 pg (ref 26.0–34.0)
MCHC: 35.7 g/dL (ref 30.0–36.0)
MCV: 86.8 fL (ref 78.0–100.0)
Monocytes Absolute: 0.5 10*3/uL (ref 0.1–1.0)
Monocytes Relative: 10 % (ref 3–12)
NEUTROS ABS: 2.2 10*3/uL (ref 1.7–7.7)
NEUTROS PCT: 40 % — AB (ref 43–77)
Platelets: 202 10*3/uL (ref 150–400)
RBC: 4.1 MIL/uL (ref 3.87–5.11)
RDW: 12.9 % (ref 11.5–15.5)
WBC: 5.6 10*3/uL (ref 4.0–10.5)

## 2013-06-26 LAB — BASIC METABOLIC PANEL
BUN: 19 mg/dL (ref 6–23)
CO2: 25 mEq/L (ref 19–32)
Calcium: 9.3 mg/dL (ref 8.4–10.5)
Chloride: 101 mEq/L (ref 96–112)
Creatinine, Ser: 0.9 mg/dL (ref 0.50–1.10)
GFR calc non Af Amer: 81 mL/min — ABNORMAL LOW (ref >90)
Glucose, Bld: 104 mg/dL — ABNORMAL HIGH (ref 70–99)
POTASSIUM: 3.8 meq/L (ref 3.7–5.3)
Sodium: 138 mEq/L (ref 137–147)

## 2013-06-26 LAB — PREGNANCY, URINE: Preg Test, Ur: NEGATIVE

## 2013-06-26 MED ORDER — SODIUM CHLORIDE 0.9 % IV BOLUS (SEPSIS)
500.0000 mL | Freq: Once | INTRAVENOUS | Status: AC
  Administered 2013-06-26: 500 mL via INTRAVENOUS

## 2013-06-26 MED ORDER — HYDROMORPHONE HCL PF 1 MG/ML IJ SOLN
1.0000 mg | Freq: Once | INTRAMUSCULAR | Status: AC
  Administered 2013-06-26: 1 mg via INTRAVENOUS
  Filled 2013-06-26: qty 1

## 2013-06-26 MED ORDER — KETOROLAC TROMETHAMINE 30 MG/ML IJ SOLN
30.0000 mg | Freq: Once | INTRAMUSCULAR | Status: AC
  Administered 2013-06-26: 30 mg via INTRAVENOUS
  Filled 2013-06-26: qty 1

## 2013-06-26 MED ORDER — MORPHINE SULFATE 4 MG/ML IJ SOLN: 4.0000 mg | Freq: Once | INTRAMUSCULAR | Status: DC

## 2013-06-26 MED ORDER — HYDROMORPHONE HCL PF 1 MG/ML IJ SOLN
1.0000 mg | Freq: Once | INTRAMUSCULAR | Status: AC
Start: 2013-06-26 — End: 2013-06-26
  Administered 2013-06-26: 1 mg via INTRAVENOUS

## 2013-06-26 MED ORDER — HYDROMORPHONE HCL PF 1 MG/ML IJ SOLN
0.5000 mg | Freq: Once | INTRAMUSCULAR | Status: AC
  Administered 2013-06-26: 0.5 mg via INTRAVENOUS
  Filled 2013-06-26: qty 1

## 2013-06-26 MED ORDER — HYDROMORPHONE HCL PF 1 MG/ML IJ SOLN
INTRAMUSCULAR | Status: AC
  Filled 2013-06-26: qty 1

## 2013-06-26 NOTE — ED Notes (Signed)
Patient transported to X-ray 

## 2013-06-26 NOTE — ED Notes (Signed)
Pain r/t sickle cell disease since yesterday morning, no relief from tramadol taken at 7 pm

## 2013-06-26 NOTE — ED Provider Notes (Addendum)
CSN: [id number]     Arrival date & time 06/26/13  0111 History   First MD Initiated Contact with Patient 06/26/13 0140     Chief Complaint  Patient presents with  . Sickle Cell Pain Crisis     (Consider location/radiation/quality/duration/timing/severity/associated sxs/prior Treatment) Patient is a 38 y.o. female presenting with sickle cell pain. The history is provided by the patient.  Sickle Cell Pain Crisis Location:  Lower extremity Severity:  Severe Onset quality:  Gradual Duration:  1 day Similar to previous crisis episodes: yes   Timing:  Intermittent Progression:  Unchanged Chronicity:  Recurrent Type: Sickle trait has not yet followed up with hematology. Context: not alcohol consumption   Relieved by:  Nothing Worsened by:  Nothing tried Ineffective treatments: ultram. Associated symptoms: no chest pain, no cough, no fever, no shortness of breath and no wheezing   Risk factors: no frequent admissions for fever and no hx of stroke     Past Medical History  Diagnosis Date  . Sickle cell anemia    History reviewed. No pertinent past surgical history. Family History  Problem Relation Age of Onset  . Stroke Brother   . Diabetes Paternal Uncle    History  Substance Use Topics  . Smoking status: Never Smoker   . Smokeless tobacco: Never Used  . Alcohol Use: No   OB History   Grav Para Term Preterm Abortions TAB SAB Ect Mult Living                 Review of Systems  Constitutional: Negative for fever.  Respiratory: Negative for cough, shortness of breath and wheezing.   Cardiovascular: Negative for chest pain.  All other systems reviewed and are negative.     Allergies  Quinine derivatives  Home Medications   Prior to Admission medications   Medication Sig Start Date End Date Taking? Authorizing Provider  FOLIC ACID PO Take by mouth.     Yes Historical Provider, MD  MedroxyPROGESTERone Acetate (DEPO-PROVERA IM) Inject into the muscle every 3 (three)  months.   Yes Historical Provider, MD  traMADol (ULTRAM) 50 MG tablet Take by mouth every 6 (six) hours as needed.   Yes Historical Provider, MD  HYDROcodone-acetaminophen (NORCO/VICODIN) 5-325 MG per tablet Take 1 tablet by mouth every 4 (four) hours as needed. 01/27/13   Altha Harm, MD  ibuprofen (ADVIL,MOTRIN) 800 MG tablet Take 1 tablet (800 mg total) by mouth every 6 (six) hours as needed. 01/13/13   Altha Harm, MD  ondansetron (ZOFRAN) 4 MG tablet Take 1 tablet (4 mg total) by mouth every 8 (eight) hours as needed for nausea or vomiting. 01/13/13   Altha Harm, MD  potassium chloride (K-DUR) 10 MEQ tablet Take 2 tablets (20 mEq total) by mouth 2 (two) times daily. 01/15/12   Grayce Sessions, NP  Vitamin D, Ergocalciferol, (DRISDOL) 50000 UNITS CAPS capsule Take 1 capsule (50,000 Units total) by mouth every 7 (seven) days. 01/27/13   Altha Harm, MD   BP 127/65  Pulse 80  Temp(Src) 98.5 F (36.9 C) (Oral)  Resp 18  Ht 4\' 11"  (1.499 m)  Wt 114 lb (51.71 kg)  BMI 23.01 kg/m2  SpO2 100% Physical Exam  Constitutional: She is oriented to person, place, and time. She appears well-developed and well-nourished. No distress.  HENT:  Head: Normocephalic and atraumatic.  Mouth/Throat: Oropharynx is clear and moist.  Eyes: Conjunctivae are normal. Pupils are equal, round, and reactive to light.  Neck:  Normal range of motion. Neck supple.  Cardiovascular: Normal rate, regular rhythm and intact distal pulses.   Pulmonary/Chest: Effort normal and breath sounds normal. She has no wheezes. She has no rales.  Abdominal: Soft. Bowel sounds are normal. There is no tenderness. There is no rebound and no guarding.  Musculoskeletal: Normal range of motion. She exhibits no edema.  Neurological: She is alert and oriented to person, place, and time. She has normal reflexes.  Skin: Skin is warm and dry.  Psychiatric: She has a normal mood and affect.    ED Course   Procedures (including critical care time) Labs Review Labs Reviewed  CBC WITH DIFFERENTIAL - Abnormal; Notable for the following:    HCT 35.6 (*)    Neutrophils Relative % 40 (*)    Lymphocytes Relative 49 (*)    All other components within normal limits  BASIC METABOLIC PANEL - Abnormal; Notable for the following:    Glucose, Bld 104 (*)    GFR calc non Af Amer 81 (*)    All other components within normal limits  PREGNANCY, URINE  RETICULOCYTES    Imaging Review No results found.   EKG Interpretation None      MDM   Final diagnoses:  None    Has not followed up with hematology.  It is unclear what the source of her pain is because she has sickle cell trait and not SS or thalesemmia.  Has normal white count and hemoglobin reticulocyte count is not elevated.  Follow up with Dr. Ashley Royalty and hematology for ongoing care.      Jasmine Awe, MD 06/26/13 0330  Zykiria Bruening Smitty Cords, MD 06/26/13 [phone]

## 2013-06-26 NOTE — ED Notes (Signed)
MD at bedside. 

## 2013-07-21 ENCOUNTER — Telehealth: Payer: Self-pay | Admitting: Internal Medicine

## 2013-07-21 NOTE — Telephone Encounter (Signed)
Contacted patient regarding follow up appointment. [place] and will call for appointment when she returns to [facility].

## 2013-09-17 ENCOUNTER — Encounter (HOSPITAL_BASED_OUTPATIENT_CLINIC_OR_DEPARTMENT_OTHER): Payer: Self-pay | Admitting: Emergency Medicine

## 2013-09-17 ENCOUNTER — Emergency Department (HOSPITAL_BASED_OUTPATIENT_CLINIC_OR_DEPARTMENT_OTHER)
Admission: EM | Admit: 2013-09-17 | Discharge: 2013-09-17 | Disposition: A | Discharge status: Home / Self Care | Payer: 59 | Attending: Emergency Medicine | Admitting: Emergency Medicine

## 2013-09-17 DIAGNOSIS — M549 Dorsalgia, unspecified: Secondary | ICD-10-CM | POA: Insufficient documentation

## 2013-09-17 DIAGNOSIS — IMO0001 Reserved for inherently not codable concepts without codable children: Secondary | ICD-10-CM | POA: Insufficient documentation

## 2013-09-17 DIAGNOSIS — Z3202 Encounter for pregnancy test, result negative: Secondary | ICD-10-CM | POA: Diagnosis not present

## 2013-09-17 DIAGNOSIS — Z79899 Other long term (current) drug therapy: Secondary | ICD-10-CM | POA: Insufficient documentation

## 2013-09-17 DIAGNOSIS — Z791 Long term (current) use of non-steroidal anti-inflammatories (NSAID): Secondary | ICD-10-CM | POA: Insufficient documentation

## 2013-09-17 DIAGNOSIS — D571 Sickle-cell disease without crisis: Secondary | ICD-10-CM | POA: Diagnosis not present

## 2013-09-17 DIAGNOSIS — M791 Myalgia, unspecified site: Secondary | ICD-10-CM

## 2013-09-17 LAB — CBC WITH DIFFERENTIAL/PLATELET
Basophils Absolute: 0 10*3/uL (ref 0.0–0.1)
Basophils Relative: 0 % (ref 0–1)
Eosinophils Absolute: 0 10*3/uL (ref 0.0–0.7)
Eosinophils Relative: 1 % (ref 0–5)
HCT: 39.7 % (ref 36.0–46.0)
Hemoglobin: 14.2 g/dL (ref 12.0–15.0)
Lymphocytes Relative: 43 % (ref 12–46)
Lymphs Abs: 1.5 10*3/uL (ref 0.7–4.0)
MCH: 30.6 pg (ref 26.0–34.0)
MCHC: 35.8 g/dL (ref 30.0–36.0)
MCV: 85.6 fL (ref 78.0–100.0)
Monocytes Absolute: 0.3 10*3/uL (ref 0.1–1.0)
Monocytes Relative: 8 % (ref 3–12)
NEUTROS ABS: 1.6 10*3/uL — AB (ref 1.7–7.7)
NEUTROS PCT: 47 % (ref 43–77)
PLATELETS: 214 10*3/uL (ref 150–400)
RBC: 4.64 MIL/uL (ref 3.87–5.11)
RDW: 12.8 % (ref 11.5–15.5)
WBC: 3.3 10*3/uL — ABNORMAL LOW (ref 4.0–10.5)

## 2013-09-17 LAB — RAPID URINE DRUG SCREEN, HOSP PERFORMED
Amphetamines: NOT DETECTED
Barbiturates: NOT DETECTED
Benzodiazepines: NOT DETECTED
Cocaine: NOT DETECTED
OPIATES: NOT DETECTED
TETRAHYDROCANNABINOL: NOT DETECTED

## 2013-09-17 LAB — BASIC METABOLIC PANEL
ANION GAP: 12 (ref 5–15)
BUN: 9 mg/dL (ref 6–23)
CHLORIDE: 103 meq/L (ref 96–112)
CO2: 24 mEq/L (ref 19–32)
Calcium: 9.5 mg/dL (ref 8.4–10.5)
Creatinine, Ser: 0.8 mg/dL (ref 0.50–1.10)
GFR calc Af Amer: >90 mL/min (ref >90)
GFR calc non Af Amer: >90 mL/min (ref >90)
Glucose, Bld: 116 mg/dL — ABNORMAL HIGH (ref 70–99)
Potassium: 3.9 mEq/L (ref 3.7–5.3)
Sodium: 139 mEq/L (ref 137–147)

## 2013-09-17 LAB — URINALYSIS, ROUTINE W REFLEX MICROSCOPIC
BILIRUBIN URINE: NEGATIVE
Glucose, UA: NEGATIVE mg/dL
Hgb urine dipstick: NEGATIVE
Ketones, ur: NEGATIVE mg/dL
Leukocytes, UA: NEGATIVE
Nitrite: NEGATIVE
PROTEIN: NEGATIVE mg/dL
SPECIFIC GRAVITY, URINE: 1.013 (ref 1.005–1.030)
UROBILINOGEN UA: 0.2 mg/dL (ref 0.0–1.0)
pH: 5.5 (ref 5.0–8.0)

## 2013-09-17 LAB — PREGNANCY, URINE: Preg Test, Ur: NEGATIVE

## 2013-09-17 MED ORDER — KETOROLAC TROMETHAMINE 60 MG/2ML IM SOLN
60.0000 mg | Freq: Once | INTRAMUSCULAR | Status: AC
  Administered 2013-09-17: 60 mg via INTRAMUSCULAR
  Filled 2013-09-17: qty 2

## 2013-09-17 MED ORDER — MELOXICAM 7.5 MG PO TABS: 7.5000 mg | ORAL_TABLET | Freq: Every day | ORAL | Status: DC

## 2013-09-17 MED ORDER — ONDANSETRON 8 MG PO TBDP
8.0000 mg | ORAL_TABLET | Freq: Once | ORAL | Status: AC
  Administered 2013-09-17: 8 mg via ORAL
  Filled 2013-09-17: qty 1

## 2013-09-17 NOTE — ED Provider Notes (Signed)
CSN: [id number]     Arrival date & time 09/17/13  0717 History   First MD Initiated Contact with Patient 09/17/13 0802     Chief Complaint  Patient presents with  . Back Pain     (Consider location/radiation/quality/duration/timing/severity/associated sxs/prior Treatment) HPI 38 year old female who comes in today complaining of diffuse muscle aches. She states she has pain in her arms legs and side. She has had multiple similar episodes of the same. She lives sickle cell anemia and her history. She was not noted to have an anemia on previous visit. There is a note from Dr. Ashley Royalty is that she has sickle cell trait not sickle cell disease. She is [facility] going [facility]. She states that she has had chills. She denies fever. She denies headache or head injury, neck pain, chest pain, nausea vomiting, or diarrhea the Past Medical History  Diagnosis Date  . Sickle cell anemia    History reviewed. No pertinent past surgical history. Family History  Problem Relation Age of Onset  . Stroke Brother   . Diabetes Paternal Uncle    History  Substance Use Topics  . Smoking status: Never Smoker   . Smokeless tobacco: Never Used  . Alcohol Use: No   OB History   Grav Para Term Preterm Abortions TAB SAB Ect Mult Living                 Review of Systems  All other systems reviewed and are negative.     Allergies  Quinine derivatives  Home Medications   Prior to Admission medications   Medication Sig Start Date End Date Taking? Authorizing Provider  FOLIC ACID PO Take by mouth.     Yes Historical Provider, MD  ibuprofen (ADVIL,MOTRIN) 800 MG tablet Take 1 tablet (800 mg total) by mouth every 6 (six) hours as needed. 01/13/13  Yes Altha Harm, MD  MedroxyPROGESTERone Acetate (DEPO-PROVERA IM) Inject into the muscle every 3 (three) months.   Yes Historical Provider, MD  ondansetron (ZOFRAN) 4 MG tablet Take 1 tablet (4 mg total) by  mouth every 8 (eight) hours as needed for nausea or vomiting. 01/13/13  Yes Altha Harm, MD  traMADol (ULTRAM) 50 MG tablet Take by mouth every 6 (six) hours as needed.   Yes Historical Provider, MD  HYDROcodone-acetaminophen (NORCO/VICODIN) 5-325 MG per tablet Take 1 tablet by mouth every 4 (four) hours as needed. 01/27/13   Altha Harm, MD  meloxicam (MOBIC) 7.5 MG tablet Take 1 tablet (7.5 mg total) by mouth daily. 09/17/13   Hilario Quarry, MD  potassium chloride (K-DUR) 10 MEQ tablet Take 2 tablets (20 mEq total) by mouth 2 (two) times daily. 01/15/12   Grayce Sessions, NP  Vitamin D, Ergocalciferol, (DRISDOL) 50000 UNITS CAPS capsule Take 1 capsule (50,000 Units total) by mouth every 7 (seven) days. 01/27/13   Altha Harm, MD   BP 116/81  Pulse 78  Temp(Src) 98.4 F (36.9 C) (Oral)  Resp 16  Ht 5' (1.524 m)  SpO2 100% Physical Exam  Nursing note and vitals reviewed. Constitutional: She is oriented to person, place, and time. She appears well-developed and well-nourished.  HENT:  Head: Normocephalic and atraumatic.  Right Ear: External ear normal.  Left Ear: External ear normal.  Nose: Nose normal.  Mouth/Throat: Oropharynx is clear and moist.  Eyes: Conjunctivae and EOM are normal. Pupils are equal, round, and reactive to light.  Neck: Normal range  of motion. Neck supple.  Cardiovascular: Normal rate, regular rhythm, normal heart sounds and intact distal pulses.   Pulmonary/Chest: Effort normal and breath sounds normal.  Abdominal: Soft. Bowel sounds are normal.  Musculoskeletal: Normal range of motion.  Diffuse tenderness palpation arms and legs  Neurological: She is alert and oriented to person, place, and time. She has normal reflexes.  Skin: Skin is warm and dry.  Markings on the to the back consistent with African tribal markings  Psychiatric: She has a normal mood and affect. Her behavior is normal. Judgment and thought content normal.    ED  Course  Procedures (including critical care time) Labs Review Labs Reviewed  CBC WITH DIFFERENTIAL - Abnormal; Notable for the following:    WBC 3.3 (*)    Neutro Abs 1.6 (*)    All other components within normal limits  BASIC METABOLIC PANEL - Abnormal; Notable for the following:    Glucose, Bld 116 (*)    All other components within normal limits  URINALYSIS, ROUTINE W REFLEX MICROSCOPIC  PREGNANCY, URINE  URINE RAPID DRUG SCREEN (HOSP PERFORMED)    Imaging Review No results found.   EKG Interpretation None      MDM   Final diagnoses:  Myalgia   38 year old female with diffuse myalgias. She self reports sickle cell disease but there are notes in her chart does state that she has sickle cell trait and her hemoglobin here today is 14. She has not had any vomiting has and has had Toradol with some pain relief. She is given a prescription for meloxicam. She is advised regarding     Hilario Quarry, MD 09/17/13 [phone]

## 2013-09-17 NOTE — ED Notes (Signed)
Pt acuity changed due to plan of treatment.

## 2013-09-17 NOTE — Discharge Instructions (Signed)

## 2013-09-17 NOTE — ED Notes (Signed)
C/o mid lower back, hands, chest, and legs. No injury. Feels cold all the time. States she ran a fever of 99.1 yesterday. Feels nauseated.

## 2014-06-30 ENCOUNTER — Other Ambulatory Visit (HOSPITAL_COMMUNITY): Payer: Self-pay | Admitting: Primary Care

## 2014-06-30 ENCOUNTER — Ambulatory Visit (HOSPITAL_COMMUNITY)
Admission: RE | Admit: 2014-06-30 | Discharge: 2014-06-30 | Disposition: A | Discharge status: Home / Self Care | Payer: BLUE CROSS/BLUE SHIELD | Source: Ambulatory Visit | Attending: Primary Care | Admitting: Primary Care

## 2014-06-30 DIAGNOSIS — D571 Sickle-cell disease without crisis: Secondary | ICD-10-CM | POA: Diagnosis not present

## 2014-06-30 DIAGNOSIS — R21 Rash and other nonspecific skin eruption: Secondary | ICD-10-CM | POA: Insufficient documentation

## 2014-12-07 ENCOUNTER — Ambulatory Visit (INDEPENDENT_AMBULATORY_CARE_PROVIDER_SITE_OTHER): Payer: BLUE CROSS/BLUE SHIELD | Admitting: Internal Medicine

## 2014-12-07 ENCOUNTER — Encounter: Payer: Self-pay | Admitting: Internal Medicine

## 2014-12-07 VITALS — BP 128/86 | HR 83 | Temp 98.0°F | Resp 18 | Ht 60.0 in | Wt 149.0 lb

## 2014-12-07 DIAGNOSIS — E559 Vitamin D deficiency, unspecified: Secondary | ICD-10-CM

## 2014-12-07 DIAGNOSIS — D573 Sickle-cell trait: Secondary | ICD-10-CM

## 2014-12-07 DIAGNOSIS — Z3042 Encounter for surveillance of injectable contraceptive: Secondary | ICD-10-CM | POA: Insufficient documentation

## 2014-12-07 DIAGNOSIS — Z3049 Encounter for surveillance of other contraceptives: Secondary | ICD-10-CM | POA: Diagnosis not present

## 2014-12-07 DIAGNOSIS — G894 Chronic pain syndrome: Secondary | ICD-10-CM | POA: Diagnosis not present

## 2014-12-07 LAB — CBC WITH DIFFERENTIAL/PLATELET
BASOS PCT: 0 % (ref 0–1)
Basophils Absolute: 0 10*3/uL (ref 0.0–0.1)
Eosinophils Absolute: 0.1 10*3/uL (ref 0.0–0.7)
Eosinophils Relative: 1 % (ref 0–5)
HCT: 38.7 % (ref 36.0–46.0)
HEMOGLOBIN: 13.5 g/dL (ref 12.0–15.0)
Lymphocytes Relative: 45 % (ref 12–46)
Lymphs Abs: 2.3 10*3/uL (ref 0.7–4.0)
MCH: 30.6 pg (ref 26.0–34.0)
MCHC: 34.9 g/dL (ref 30.0–36.0)
MCV: 87.8 fL (ref 78.0–100.0)
MONO ABS: 0.4 10*3/uL (ref 0.1–1.0)
MPV: 10.2 fL (ref 8.6–12.4)
Monocytes Relative: 8 % (ref 3–12)
NEUTROS ABS: 2.3 10*3/uL (ref 1.7–7.7)
Neutrophils Relative %: 46 % (ref 43–77)
Platelets: 228 10*3/uL (ref 150–400)
RBC: 4.41 MIL/uL (ref 3.87–5.11)
RDW: 15.2 % (ref 11.5–15.5)
WBC: 5.1 10*3/uL (ref 4.0–10.5)

## 2014-12-07 LAB — POCT URINE PREGNANCY: Preg Test, Ur: NEGATIVE

## 2014-12-07 NOTE — Patient Instructions (Signed)

## 2014-12-07 NOTE — Progress Notes (Signed)
Patient here to Establish care.  Patient complains of leg cramping beginning at night. Patient denies pain at this time.  Patient has not had Depo- patient states she wants to see her cycle prior to having next injection.

## 2014-12-07 NOTE — Progress Notes (Signed)
Patient ID: Tina Huber, female   DOB: [birth year], 39 y.o.   MRN: [id number]   Tina Huber, is a 39 y.o. female  [id number]  [id number]  DOB - 11/26/1975  No chief complaint on file.       Subjective:   Tina Huber is a 39 y.o. female here today to reestablish medical care. Patient has chronic pain, she believes this is from some sickle cell disease that was diagnosed in [place] in the early 90s, but when she got to the [place], her hemoglobin electrophoresis showed hemoglobin AS. She does not know why she continues to have chronic pain mostly in her joints and sometimes lower abdomen. She said her pain occasionally is associated with fever especially at night but denies night sweats. She has no history of chronic cough. She has no family history of autoimmune disorders. She has no history of thyroid disease. She has 2 children, a 44 years old girl and a 62-year-old boy. She is not on anything medication for chronic disease, although she takes ibuprofen as needed for pain. 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 Today she has no significant complaint except that she wants to know why she is having chronic pain. She says she had osteochondroma in her right thigh that was removed remotely. She does not drink alcohol, she does not smoke cigarette. She is currently on Depo-Provera for contraception. She had a history of abnormal Pap smear but according to patient her biopsy was negative subsequently. Patient has No headache, No chest pain, No abdominal pain - No Nausea, No new weakness tingling or numbness, No Cough - SOB.  Problem  Depo Contraception  Sickle Cell Trait Syndrome (Hcc)    ALLERGIES: Allergies  Allergen Reactions  . Quinine Derivatives Itching    PAST MEDICAL HISTORY: Past Medical History  Diagnosis Date  . Sickle cell anemia (HCC)     MEDICATIONS AT HOME: Prior to Admission  medications   Medication Sig Start Date End Date Taking? Authorizing Provider  FOLIC ACID PO Take by mouth.     Yes Historical Provider, MD  ibuprofen (ADVIL,MOTRIN) 800 MG tablet Take 1 tablet (800 mg total) by mouth every 6 (six) hours as needed. 01/13/13  Yes Altha Harm, MD  Vitamin D, Ergocalciferol, (DRISDOL) 50000 UNITS CAPS capsule Take 1 capsule (50,000 Units total) by mouth every 7 (seven) days. 01/27/13  Yes Altha Harm, MD  MedroxyPROGESTERone Acetate (DEPO-PROVERA IM) Inject into the muscle every 3 (three) months.    Historical Provider, MD  potassium chloride (K-DUR) 10 MEQ tablet Take 2 tablets (20 mEq total) by mouth 2 (two) times daily. Patient not taking: Reported on 12/07/2014 01/15/12   Grayce Sessions, NP     Objective:   Filed Vitals:   12/07/14 1007  BP: 128/86  Pulse: 83  Temp: 98 F (36.7 C)  TempSrc: Oral  Resp: 18  Height: 5' (1.524 m)  Weight: 149 lb (67.586 kg)  SpO2: 100%    Exam General appearance : Awake, alert, not in any distress. Speech Clear. Not toxic looking HEENT: Atraumatic and Normocephalic, pupils equally reactive to light and accomodation Neck: supple, no JVD. No cervical lymphadenopathy.  Chest:Good air entry bilaterally, no added sounds  CVS: S1 S2 regular, no murmurs.  Abdomen: Bowel sounds present, Non tender and not distended with no gaurding, rigidity or rebound. Extremities: B/L Lower Ext shows no edema, both  legs are warm to touch Neurology: Awake alert, and oriented X 3, CN II-XII intact, Non focal Skin:No Rash  Data Review No results found for: HGBA1C   Assessment & Plan   1. Depo contraception  - POCT urine pregnancy  2. Chronic pain syndrome  - COMPLETE METABOLIC PANEL WITH GFR - POCT glycosylated hemoglobin (Hb A1C) - Lipid panel - TSH - ANA - HIV antibody (with reflex)  3. Sickle cell trait syndrome (HCC) Patient was educated that she does not have sickle cell disease and her pain may  be due to some other processes that we need to work up as ordered.   - CBC with Differential/Platelet  4. Vitamin D deficiency  - Vit D  25 hydroxy (rtn osteoporosis monitoring)  Patient have been counseled extensively about nutrition and exercise  Return in about 3 months (around 03/09/2015), or if symptoms worsen or fail to improve, for Follow up Pain and comorbidities, Annual Physical.  The patient was given clear instructions to go to ER or return to medical center if symptoms don't improve, worsen or new problems develop. The patient verbalized understanding. The patient was told to call to get lab results if they haven't heard anything in the next week.   This note has been created with Education officer, environmental. Any transcriptional errors are unintentional.    Jeanann Lewandowsky, MD, MHA, FACP, FAAP, CPE [facility] [place], [place] [phone]   12/07/2014, 11:00 AM

## 2014-12-08 LAB — COMPLETE METABOLIC PANEL WITH GFR
ALT: 21 U/L (ref 6–29)
AST: 22 U/L (ref 10–30)
Albumin: 4.3 g/dL (ref 3.6–5.1)
Alkaline Phosphatase: 57 U/L (ref 33–115)
BUN: 15 mg/dL (ref 7–25)
CHLORIDE: 103 mmol/L (ref 98–110)
CO2: 25 mmol/L (ref 20–31)
Calcium: 9.1 mg/dL (ref 8.6–10.2)
Creat: 0.67 mg/dL (ref 0.50–1.10)
GFR, Est African American: >89 mL/min (ref >=60)
GFR, Est Non African American: >89 mL/min (ref >=60)
GLUCOSE: 86 mg/dL (ref 65–99)
POTASSIUM: 3.9 mmol/L (ref 3.5–5.3)
SODIUM: 134 mmol/L — AB (ref 135–146)
Total Bilirubin: 0.5 mg/dL (ref 0.2–1.2)
Total Protein: 7.8 g/dL (ref 6.1–8.1)

## 2014-12-08 LAB — VITAMIN D 25 HYDROXY (VIT D DEFICIENCY, FRACTURES): Vit D, 25-Hydroxy: 58 ng/mL (ref 30–100)

## 2014-12-08 LAB — LIPID PANEL
Cholesterol: 190 mg/dL (ref 125–200)
HDL: 63 mg/dL (ref >=46)
LDL Cholesterol: 117 mg/dL (ref <130)
Total CHOL/HDL Ratio: 3 Ratio (ref <=5.0)
Triglycerides: 52 mg/dL (ref <150)
VLDL: 10 mg/dL (ref <30)

## 2014-12-08 LAB — ANTI-NUCLEAR AB-TITER (ANA TITER): ANA Titer 1: 1:40 {titer} — ABNORMAL HIGH

## 2014-12-08 LAB — ANA: Anti Nuclear Antibody(ANA): POSITIVE — AB

## 2014-12-08 LAB — HIV 1/2 CONFIRMATION
HIV-1 antibody: POSITIVE — AB
HIV-2 Ab: NEGATIVE

## 2014-12-08 LAB — HIV ANTIBODY (ROUTINE TESTING W REFLEX): HIV 1&2 Ab, 4th Generation: REACTIVE — AB

## 2014-12-08 LAB — TSH: TSH: 1.607 u[IU]/mL (ref 0.350–4.500)

## 2014-12-14 ENCOUNTER — Ambulatory Visit (INDEPENDENT_AMBULATORY_CARE_PROVIDER_SITE_OTHER): Payer: BLUE CROSS/BLUE SHIELD | Admitting: Internal Medicine

## 2014-12-14 DIAGNOSIS — G894 Chronic pain syndrome: Secondary | ICD-10-CM | POA: Diagnosis not present

## 2014-12-14 DIAGNOSIS — R768 Other specified abnormal immunological findings in serum: Secondary | ICD-10-CM

## 2014-12-14 DIAGNOSIS — Z21 Asymptomatic human immunodeficiency virus [HIV] infection status: Secondary | ICD-10-CM | POA: Diagnosis not present

## 2014-12-14 DIAGNOSIS — B20 Human immunodeficiency virus [HIV] disease: Secondary | ICD-10-CM | POA: Insufficient documentation

## 2014-12-14 NOTE — Progress Notes (Signed)
Patient ID: Tina Huber, female   DOB: Jan 09, 1976, 39 y.o.   MRN: [id number]   Tina Huber, is a 39 y.o. female  [id number]:[id number]  [id number]:[id number]  DOB - Mar 04, 1975  No chief complaint on file.       Subjective:   Tina Huber is a 39 y.o. female here today for a follow up visit. Patient was seen here about a week ago for vague symptoms of generalized body pain. She has history of sickle cell trait but no disease. Her evaluations on laboratory test results shows HIV positive, patient was called to the clinic today to discuss the results and for further evaluations as well as referral to infectious disease specialist. Patient has no new complaints today. Patient has No headache, No chest pain, No abdominal pain - No Nausea, No new weakness tingling or numbness, No Cough - SOB.  Problem  Asymptomatic HIV Infection (Hcc)  Ana Positive    ALLERGIES: Allergies  Allergen Reactions  . Quinine Derivatives Itching    PAST MEDICAL HISTORY: Past Medical History  Diagnosis Date  . Sickle cell anemia (HCC)     MEDICATIONS AT HOME: Prior to Admission medications   Medication Sig Start Date End Date Taking? Authorizing Provider  FOLIC ACID PO Take by mouth.      Historical Provider, MD  ibuprofen (ADVIL,MOTRIN) 800 MG tablet Take 1 tablet (800 mg total) by mouth every 6 (six) hours as needed. 01/13/13   Altha Harm, MD  MedroxyPROGESTERone Acetate (DEPO-PROVERA IM) Inject into the muscle every 3 (three) months.    Historical Provider, MD  potassium chloride (K-DUR) 10 MEQ tablet Take 2 tablets (20 mEq total) by mouth 2 (two) times daily. Patient not taking: Reported on 12/07/2014 01/15/12   Grayce Sessions, NP  Vitamin D, Ergocalciferol, (DRISDOL) 50000 UNITS CAPS capsule Take 1 capsule (50,000 Units total) by mouth every 7 (seven) days. 01/27/13   Altha Harm, MD     Objective:   There were no vitals filed for this visit.  Exam General appearance : Awake, alert, not  in any distress. Speech Clear. Not toxic looking HEENT: Atraumatic and Normocephalic, pupils equally reactive to light and accomodation Neck: supple, no JVD. No cervical lymphadenopathy.  Chest:Good air entry bilaterally, no added sounds  CVS: S1 S2 regular, no murmurs.  Abdomen: Bowel sounds present, Non tender and not distended with no gaurding, rigidity or rebound. Extremities: B/L Lower Ext shows no edema, both legs are warm to touch Neurology: Awake alert, and oriented X 3, CN II-XII intact, Non focal Skin:No Rash  Data Review No results found for: HGBA1C   Assessment & Plan   1. Asymptomatic HIV infection (HCC) Patient claims she is not very surprised although disappointed with his result, she blames it on her ex-husband who was "all over the place". She asked several questions pertaining to treatment choices and the option of no treatment at all if her viral load is 0. She was counseled extensively and given referral to infectious disease specialist for further evaluation and management. All questions and concerns were appropriately answered and addressed.  - HIV 1 RNA quant-no reflex-bld - HIV-1 Genotyping (RTI,PI,IN Inhbtr) - Ambulatory referral to Infectious Disease  2. Chronic pain syndrome   OTC NSAIDs as needed  3. ANA positive  - Cyclic Citrul Peptide Antibody, IGG - Anti-DNA antibody, double-stranded  Patient have been counseled extensively about nutrition and exercise  Return in about 3 months (around 03/16/2015) for Follow up Pain and comorbidities.  The  patient was given clear instructions to go to ER or return to medical center if symptoms don't improve, worsen or new problems develop. The patient verbalized understanding. The patient was told to call to get lab results if they haven't heard anything in the next week.   This note has been created with Education officer, environmental. Any transcriptional errors are unintentional.     Jeanann Lewandowsky, MD, MHA, FACP, FAAP, CPE [facility] [place], [place] [phone]   12/14/2014, 12:39 PM

## 2014-12-14 NOTE — Patient Instructions (Signed)
HIV Antibody Test °The HIV antibody test is used to screen for the human immunodeficiency virus (HIV), the virus that causes AIDS. This test detects the proteins (antibodies) that your body's defense system (immune system) makes to fight the infection. Having antibodies to HIV does not mean you have AIDS or will get AIDS. HIV tests can be performed in a clinic, lab, or hospital setting. There are also home testing kits.  °It is possible to have HIV without any symptoms. There is no cure for HIV, but starting treatment early helps you stay healthy longer. If you know you have HIV (HIV-positive), you can also make any changes to prevent infecting others.  °The Centers for Disease Control and Prevention (CDC) recommends that everyone between the ages of 13 and 64 have this test at least once. Your health care provider may recommend this test if: °· You are pregnant or planning on becoming pregnant. °· You have had sex with someone who is or might be HIV-positive. °· You have had unprotected sex with more than one partner. °· You are a health worker and were exposed to HIV-infected blood. °· You have ever injected illegal drugs. °· You have ever exchanged sex for money or drugs. °· You have been diagnosed with hepatitis, tuberculosis, or a sexually transmitted infection (STI). °· You are a man who has sex with other men (MSM). °· You are exhibiting symptoms of AIDS. °· You had a blood transfusion before 1985. °PREPARATION FOR TEST  °The HIV screening requires either a blood sample or a sample of the fluid from inside your mouth (oral fluid).  °· For the blood test, a blood sample is drawn from a vein in your hand or arm. °· For the test using oral fluid, your upper and lower gums are swabbed. °If you decide to do a home HIV test, follow the package instructions carefully. There are two types of home testing kits: °· Blood test. You will send your test kit including a small sample of your blood to the manufacturer for  processing. You will receive your results from the manufacturer. °· Oral fluid test. This test is completed at home and usually gives you results in 20-40 minutes. °RESULTS  °It is your responsibility to obtain your test results. Ask the lab or department performing the test when and how you will get your results. Contact your health care provider to discuss any questions you have about your results.  °The results of the HIV screening test will either be positive or negative.  °Meaning of Negative Test Results °If your HIV antibody test is negative, it means there were no antibodies in your blood at the time of the test. It is important to know that it can take 1-3 months to develop antibodies after infection. If you may have been infected within the previous 3 months, you should repeat the test after that time period. °Meaning of Positive Test Results °If your HIV antibody test is positive, it means the test detected antibodies to HIV in your sample. You will need to have another type of test to confirm the results of the initial test. A positive result to the HIV antibody test does not mean you will develop AIDS. °If you have completed a home test kit and the results are positive, contact your health care provider for repeat testing to confirm your results. °  °This information is not intended to replace advice given to you by your health care provider. Make sure you discuss any   questions you have with your health care provider. °  °Document Released: 01/13/2000 Document Revised: 02/05/2014 Document Reviewed: 04/20/2013 °Elsevier Interactive Patient Education ©2016 Elsevier Inc. ° °

## 2014-12-15 LAB — CYCLIC CITRUL PEPTIDE ANTIBODY, IGG: Cyclic Citrullin Peptide Ab: 27 Units — ABNORMAL HIGH

## 2014-12-15 LAB — ANTI-DNA ANTIBODY, DOUBLE-STRANDED: DS DNA AB: 1 [IU]/mL

## 2014-12-20 ENCOUNTER — Telehealth: Payer: Self-pay

## 2014-12-20 NOTE — Telephone Encounter (Signed)
Spoke with patient regarding new intake. She is not able to take more time from work and would like to meet later in December.   I have her scheduled for January 18, 2015.   She would like office information sent via e-mail.  I will send My Chart if available. If not I will see if the supporting service can send information.  Laurell Josephs, RN

## 2015-01-05 ENCOUNTER — Telehealth: Payer: Self-pay | Admitting: *Deleted

## 2015-01-05 NOTE — Telephone Encounter (Signed)
Patient verified DOB Patient made aware of office stilling awaiting her HIV quantitative analysis. Patient is aware of low level antibody showing rheumatoid arthritis.  Patient informed of monitoring being continued and if she has any joint pain or swelling we will refer her to hematologist. Patient expressed her understanding and had no further questions.

## 2015-01-05 NOTE — Telephone Encounter (Signed)
Medical Assistant left message on patient's home and cell voicemail. Voicemail states to give a call back to [place] Mielke with [facility] at [phone].  

## 2015-01-05 NOTE — Telephone Encounter (Signed)
-----   Message from Quentin Angst, MD sent at 12/31/2014  6:10 PM EST ----- We are still awaiting result of HIV quantitative analysis. There is also a low-level antibody positive for rheumatoid arthritis, we will monitor, if there are symptoms of joint pain or swelling, will refer to hematologist.

## 2015-01-18 ENCOUNTER — Ambulatory Visit: Payer: BLUE CROSS/BLUE SHIELD

## 2015-01-20 ENCOUNTER — Ambulatory Visit: Payer: BLUE CROSS/BLUE SHIELD

## 2015-01-20 DIAGNOSIS — B20 Human immunodeficiency virus [HIV] disease: Secondary | ICD-10-CM

## 2015-01-21 LAB — URINALYSIS, ROUTINE W REFLEX MICROSCOPIC
Bilirubin Urine: NEGATIVE
GLUCOSE, UA: NEGATIVE
Hgb urine dipstick: NEGATIVE
Ketones, ur: NEGATIVE
LEUKOCYTES UA: NEGATIVE
Nitrite: NEGATIVE
Protein, ur: NEGATIVE
Specific Gravity, Urine: 1.018 (ref 1.001–1.035)
pH: 7.5 (ref 5.0–8.0)

## 2015-01-21 LAB — URINE CYTOLOGY ANCILLARY ONLY
Chlamydia: NEGATIVE
NEISSERIA GONORRHEA: NEGATIVE

## 2015-01-21 LAB — RPR

## 2015-01-21 LAB — HEPATITIS B CORE ANTIBODY, TOTAL: HEP B C TOTAL AB: REACTIVE — AB

## 2015-01-21 LAB — T-HELPER CELL (CD4) - (RCID CLINIC ONLY)
CD4 % Helper T Cell: 36 % (ref 33–55)
CD4 T CELL ABS: 720 /uL (ref 400–2700)

## 2015-01-21 LAB — HEPATITIS B SURFACE ANTIGEN: HEP B S AG: NEGATIVE

## 2015-01-21 LAB — HEPATITIS A ANTIBODY, IGM: Hep A IgM: NONREACTIVE

## 2015-01-21 LAB — HEPATITIS B SURFACE ANTIBODY,QUALITATIVE: HEP B S AB: POSITIVE — AB

## 2015-01-22 LAB — HIV-1 RNA ULTRAQUANT REFLEX TO GENTYP+

## 2015-01-22 LAB — QUANTIFERON TB GOLD ASSAY (BLOOD)
Interferon Gamma Release Assay: NEGATIVE
Quantiferon Nil Value: 0.03 IU/mL
Quantiferon Tb Ag Minus Nil Value: 0.04 IU/mL
TB AG VALUE: 0.07 [IU]/mL

## 2015-01-25 LAB — HEPATITIS C RNA QUANTITATIVE: HCV Quantitative: NOT DETECTED [IU]/mL

## 2015-01-27 LAB — HLA B*5701: HLA-B*5701 w/rflx HLA-B High: NEGATIVE

## 2015-02-02 ENCOUNTER — Encounter: Payer: Self-pay | Admitting: *Deleted

## 2015-02-02 NOTE — Progress Notes (Signed)
Patient is here today after being referred by Dr. Hyman Hopes for HIV. She states she did not want to come to this visit and has no intentions on taking HIV medications.  She is very calm and does not seem to be surprised about HIV results.  She states she only came to see what her viral load and CD-4 count values are.  After speaking with patient I suspect she may have known about HIV virus prior to testing with Dr. Hyman Hopes. She gives history of care of Sickle Cell while living in  [place] and taking HIV test but never receiving results in 2005 or 2006. She has not been sexually active for 7 years, is currently single  and no history of IV drug use.  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 She  does not want her information shared within the [facility] system due to  African friends who work in the system and she suspects they may look at her medical records because " Africans are nosey".  We reviewed HIPPA rights and discussed options for medical records discretion related to HIV diagnosis.  Intake was very brief since patient did not want to answer questions at this time.   Vaccines refused.  No tattoos or piercings No medical records to request.   Laurell Josephs, RN

## 2015-02-09 ENCOUNTER — Encounter: Payer: Self-pay | Admitting: Internal Medicine

## 2015-02-09 ENCOUNTER — Ambulatory Visit (INDEPENDENT_AMBULATORY_CARE_PROVIDER_SITE_OTHER): Payer: BLUE CROSS/BLUE SHIELD | Admitting: Internal Medicine

## 2015-02-09 VITALS — BP 109/76 | HR 92 | Temp 98.2°F | Ht 60.0 in | Wt 144.0 lb

## 2015-02-09 DIAGNOSIS — Z21 Asymptomatic human immunodeficiency virus [HIV] infection status: Secondary | ICD-10-CM

## 2015-02-09 DIAGNOSIS — G894 Chronic pain syndrome: Secondary | ICD-10-CM | POA: Diagnosis not present

## 2015-02-09 NOTE — Progress Notes (Signed)
Patient ID: Tina Huber, female    DOB: 1975-12-20, 40 y.o.   MRN: [id number]  Reason for visit: to establish care as a new patient with HIV  HPI:   Patient was first diagnosed by her PCP in December 2016.  She was tested as part routine screening.  Previously tested during her pregnancy 7 years ago and thinks she was negative.  The CD4 count is 720, viral load <20 though with detectable virus.  There have been no associated symptoms.  Has never been on medication.  Originally from [place] and working now as an Public house manager.  Not sexually active.  Has sickle cell trait.  Complains of pains in joints that she has had since birth.    Past Medical History  Diagnosis Date  . Sickle cell anemia (HCC)     Prior to Admission medications   Medication Sig Start Date End Date Taking? Authorizing Provider  FOLIC ACID PO Take by mouth.     Yes Historical Provider, MD  ibuprofen (ADVIL,MOTRIN) 800 MG tablet Take 1 tablet (800 mg total) by mouth every 6 (six) hours as needed. 01/13/13  Yes Altha Harm, MD  MedroxyPROGESTERone Acetate (DEPO-PROVERA IM) Inject into the muscle every 3 (three) months. Reported on 01/20/2015   Yes Historical Provider, MD  traMADol (ULTRAM) 50 MG tablet Take 50 mg by mouth 2 (two) times daily.   Yes Historical Provider, MD  Vitamin D, Ergocalciferol, (DRISDOL) 50000 UNITS CAPS capsule Take 1 capsule (50,000 Units total) by mouth every 7 (seven) days. 01/27/13  Yes Altha Harm, MD  potassium chloride (K-DUR) 10 MEQ tablet Take 2 tablets (20 mEq total) by mouth 2 (two) times daily. Patient not taking: Reported on 12/07/2014 01/15/12   Grayce Sessions, NP    Allergies  Allergen Reactions  . Quinine Derivatives Itching    Social History  Substance Use Topics  . Smoking status: Never Smoker   . Smokeless tobacco: Never Used  . Alcohol Use: No    Family History  Problem Relation Age of Onset  . Stroke Brother   . Diabetes Paternal Uncle      Review of  Systems Constitutional: negative for anorexia Musculoskeletal: positive for arthralgias and stiff joints, negative for myalgias and back pain All other systems reviewed and are negative   CONSTITUTIONAL:in no apparent distress and alert  Filed Vitals:   02/09/15 0846  BP: 109/76  Pulse: 92  Temp: 98.2 F (36.8 C)   EYES: anicteric HENT: no thrush CARD:Cor RRR and No murmurs RESP:CTA B; normal respiratory effort RX:VQMGQ sounds are normal, liver is not enlarged, spleen is not enlarged QP:YPPJKDTOIZ pulses normal, no pedal edema, no clubbing or cyanosis SKIN:no rashes NEURO: non-focal  Lab Results  Component Value Date   HIV1RNAQUANT <20 01/20/2015   No components found for: HIV1GENOTYPRPLUS No components found for: THELPERCELL  Assessment: new patient here with HIV.  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  She has detectable virus but <20, which is the goal of medication.  We do not know at this time if she would benefit from treatment with ARVs and she prefers to defer treatment at this time.  I also discussed needing to use condoms, partner disclosure, necessary vaccines, blood monitoring, despite being an elite controller.  All questions answered.  With joint aches, I do not suspect HIV related due to chronic nature of the joints pain.    Plan: 1) observe off ARVs 2) pap smear 3) rtc  4 months with repeat labs 4) further work up and evaluation of pain per PCP

## 2015-02-20 ENCOUNTER — Emergency Department (HOSPITAL_COMMUNITY): Payer: BLUE CROSS/BLUE SHIELD

## 2015-02-20 ENCOUNTER — Emergency Department (HOSPITAL_COMMUNITY)
Admission: EM | Admit: 2015-02-20 | Discharge: 2015-02-20 | Disposition: A | Discharge status: Home / Self Care | Payer: BLUE CROSS/BLUE SHIELD | Attending: Emergency Medicine | Admitting: Emergency Medicine

## 2015-02-20 ENCOUNTER — Encounter (HOSPITAL_COMMUNITY): Payer: Self-pay | Admitting: *Deleted

## 2015-02-20 DIAGNOSIS — R002 Palpitations: Secondary | ICD-10-CM | POA: Insufficient documentation

## 2015-02-20 DIAGNOSIS — R1032 Left lower quadrant pain: Secondary | ICD-10-CM | POA: Diagnosis not present

## 2015-02-20 DIAGNOSIS — M5442 Lumbago with sciatica, left side: Secondary | ICD-10-CM | POA: Diagnosis not present

## 2015-02-20 DIAGNOSIS — R42 Dizziness and giddiness: Secondary | ICD-10-CM | POA: Insufficient documentation

## 2015-02-20 DIAGNOSIS — D649 Anemia, unspecified: Secondary | ICD-10-CM | POA: Diagnosis not present

## 2015-02-20 DIAGNOSIS — Z3202 Encounter for pregnancy test, result negative: Secondary | ICD-10-CM | POA: Diagnosis not present

## 2015-02-20 DIAGNOSIS — R079 Chest pain, unspecified: Secondary | ICD-10-CM | POA: Insufficient documentation

## 2015-02-20 DIAGNOSIS — R110 Nausea: Secondary | ICD-10-CM | POA: Diagnosis not present

## 2015-02-20 DIAGNOSIS — Z79899 Other long term (current) drug therapy: Secondary | ICD-10-CM | POA: Insufficient documentation

## 2015-02-20 LAB — URINALYSIS, ROUTINE W REFLEX MICROSCOPIC
Bilirubin Urine: NEGATIVE
GLUCOSE, UA: NEGATIVE mg/dL
Hgb urine dipstick: NEGATIVE
Ketones, ur: NEGATIVE mg/dL
LEUKOCYTES UA: NEGATIVE
NITRITE: NEGATIVE
PH: 7 (ref 5.0–8.0)
Protein, ur: NEGATIVE mg/dL
SPECIFIC GRAVITY, URINE: 1.01 (ref 1.005–1.030)

## 2015-02-20 LAB — COMPREHENSIVE METABOLIC PANEL
ALBUMIN: 4 g/dL (ref 3.5–5.0)
ALK PHOS: 62 U/L (ref 38–126)
ALT: 24 U/L (ref 14–54)
ANION GAP: 7 (ref 5–15)
AST: 27 U/L (ref 15–41)
BUN: 13 mg/dL (ref 6–20)
CALCIUM: 8.7 mg/dL — AB (ref 8.9–10.3)
CHLORIDE: 105 mmol/L (ref 101–111)
CO2: 24 mmol/L (ref 22–32)
Creatinine, Ser: 0.74 mg/dL (ref 0.44–1.00)
GFR calc Af Amer: >60 mL/min (ref >60)
GFR calc non Af Amer: >60 mL/min (ref >60)
GLUCOSE: 126 mg/dL — AB (ref 65–99)
Potassium: 3.6 mmol/L (ref 3.5–5.1)
SODIUM: 136 mmol/L (ref 135–145)
Total Bilirubin: 0.8 mg/dL (ref 0.3–1.2)
Total Protein: 7.3 g/dL (ref 6.5–8.1)

## 2015-02-20 LAB — CBC WITH DIFFERENTIAL/PLATELET
Basophils Absolute: 0 10*3/uL (ref 0.0–0.1)
Basophils Relative: 0 %
EOS PCT: 2 %
Eosinophils Absolute: 0.1 10*3/uL (ref 0.0–0.7)
HEMATOCRIT: 36.2 % (ref 36.0–46.0)
HEMOGLOBIN: 12.8 g/dL (ref 12.0–15.0)
LYMPHS ABS: 1.8 10*3/uL (ref 0.7–4.0)
LYMPHS PCT: 38 %
MCH: 31.1 pg (ref 26.0–34.0)
MCHC: 35.4 g/dL (ref 30.0–36.0)
MCV: 87.9 fL (ref 78.0–100.0)
Monocytes Absolute: 0.3 10*3/uL (ref 0.1–1.0)
Monocytes Relative: 6 %
NEUTROS ABS: 2.5 10*3/uL (ref 1.7–7.7)
NEUTROS PCT: 54 %
Platelets: 205 10*3/uL (ref 150–400)
RBC: 4.12 MIL/uL (ref 3.87–5.11)
RDW: 14.2 % (ref 11.5–15.5)
WBC: 4.6 10*3/uL (ref 4.0–10.5)

## 2015-02-20 LAB — POC URINE PREG, ED: Preg Test, Ur: NEGATIVE

## 2015-02-20 LAB — TROPONIN I: Troponin I: <0.03 ng/mL (ref <0.031)

## 2015-02-20 LAB — LIPASE, BLOOD: Lipase: 36 U/L (ref 11–51)

## 2015-02-20 LAB — D-DIMER, QUANTITATIVE: D-Dimer, Quant: 0.32 ug/mL-FEU (ref 0.00–0.50)

## 2015-02-20 MED ORDER — IOHEXOL 300 MG/ML  SOLN
100.0000 mL | Freq: Once | INTRAMUSCULAR | Status: AC | PRN
  Administered 2015-02-20: 100 mL via INTRAVENOUS

## 2015-02-20 MED ORDER — HYDROCODONE-ACETAMINOPHEN 5-325 MG PO TABS: 1.0000 | ORAL_TABLET | Freq: Four times a day (QID) | ORAL | Status: DC | PRN

## 2015-02-20 MED ORDER — FENTANYL CITRATE (PF) 100 MCG/2ML IJ SOLN
100.0000 ug | Freq: Once | INTRAMUSCULAR | Status: AC
  Administered 2015-02-20: 100 ug via INTRAVENOUS
  Filled 2015-02-20: qty 2

## 2015-02-20 MED ORDER — IOHEXOL 300 MG/ML  SOLN
25.0000 mL | Freq: Once | INTRAMUSCULAR | Status: AC | PRN
Start: 2015-02-20 — End: 2015-02-20
  Administered 2015-02-20: 25 mL via ORAL

## 2015-02-20 MED ORDER — HYDROMORPHONE HCL 1 MG/ML IJ SOLN
1.0000 mg | Freq: Once | INTRAMUSCULAR | Status: AC
  Administered 2015-02-20: 1 mg via INTRAVENOUS
  Filled 2015-02-20: qty 1

## 2015-02-20 MED ORDER — HYDROCODONE-ACETAMINOPHEN 5-325 MG PO TABS
2.0000 | ORAL_TABLET | Freq: Once | ORAL | Status: AC
  Administered 2015-02-20: 2 via ORAL
  Filled 2015-02-20: qty 2

## 2015-02-20 MED ORDER — KETOROLAC TROMETHAMINE 30 MG/ML IJ SOLN
30.0000 mg | Freq: Once | INTRAMUSCULAR | Status: AC
  Administered 2015-02-20: 30 mg via INTRAVENOUS
  Filled 2015-02-20: qty 1

## 2015-02-20 MED ORDER — SODIUM CHLORIDE 0.9 % IV BOLUS (SEPSIS)
1000.0000 mL | Freq: Once | INTRAVENOUS | Status: AC
  Administered 2015-02-20: 1000 mL via INTRAVENOUS

## 2015-02-20 NOTE — ED Notes (Signed)
Pt reports dizziness with elevated HR all week.  Pt also reports back, chest and lower abd pain.  Denies any urinary sxs at this time.

## 2015-02-20 NOTE — ED Notes (Signed)
Pt at CT

## 2015-02-20 NOTE — ED Provider Notes (Signed)
CSN: [id number]     Arrival date & time 02/20/15  8341 History   First MD Initiated Contact with Patient 02/20/15 1017     Chief Complaint  Patient presents with  . Back Pain  . Abdominal Pain     (Consider location/radiation/quality/duration/timing/severity/associated sxs/prior Treatment) HPI  40 year old female presents with acute low back pain and left lower quadrant abdominal pain. Patient tells me that this all started yesterday. Took an Ultram with good relief last night but then this morning the pain was back. Pain is currently severe. Standing up and walking makes it worse. Occasionally her back pain seems radiate down her left leg to her knee. No weakness or numbness in her legs. Patient denies vomiting, occasionally has felt nauseated. No fevers. No back trauma. The back pain is somewhat left-sided but also diffuse low back. No urinary symptoms including no hematuria. Patient also notes chest pain that started this morning. It is in her mid chest and is worse with inspiration. Has never had this before. Patient states that she is not short of breath and has not had any cough or hemoptysis.  She tells me she has had intermittent dizziness and palpitations over past week, none now.  Of note, patient was diagnosed with HIV a symptomatically 2 months ago. She is not on antiretrovirals due to a normal CD4 count and undetectable viral load. Being watched by ID. Patient has a diagnosis of sickle cell anemia in her chart, further chart review shows that this is sickle cell trait which the patient endorses is her diagnosis.  Past Medical History  Diagnosis Date  . Sickle cell anemia (HCC)    History reviewed. No pertinent past surgical history. Family History  Problem Relation Age of Onset  . Stroke Brother   . Diabetes Paternal Uncle    Social History  Substance Use Topics  . Smoking status: Never Smoker   . Smokeless tobacco: Never Used  . Alcohol Use: No   OB History    No data  available     Review of Systems  Constitutional: Negative for fever.  Respiratory: Negative for cough and shortness of breath.   Cardiovascular: Positive for chest pain and palpitations. Negative for leg swelling.  Gastrointestinal: Positive for nausea and abdominal pain. Negative for vomiting, diarrhea and constipation.  Musculoskeletal: Positive for back pain.  Neurological: Positive for dizziness. Negative for weakness and numbness.  All other systems reviewed and are negative.     Allergies  Quinine derivatives  Home Medications   Prior to Admission medications   Medication Sig Start Date End Date Taking? Authorizing Provider  FOLIC ACID PO Take by mouth.      Historical Provider, MD  ibuprofen (ADVIL,MOTRIN) 800 MG tablet Take 1 tablet (800 mg total) by mouth every 6 (six) hours as needed. 01/13/13   Altha Harm, MD  MedroxyPROGESTERone Acetate (DEPO-PROVERA IM) Inject into the muscle every 3 (three) months. Reported on 01/20/2015    Historical Provider, MD  potassium chloride (K-DUR) 10 MEQ tablet Take 2 tablets (20 mEq total) by mouth 2 (two) times daily. Patient not taking: Reported on 12/07/2014 01/15/12   Grayce Sessions, NP  traMADol (ULTRAM) 50 MG tablet Take 50 mg by mouth 2 (two) times daily.    Historical Provider, MD  Vitamin D, Ergocalciferol, (DRISDOL) 50000 UNITS CAPS capsule Take 1 capsule (50,000 Units total) by mouth every 7 (seven) days. 01/27/13   Altha Harm, MD   BP 129/81 mmHg  Pulse 89  Temp(Src) 98.2 F (36.8 C) (Oral)  Resp 20  SpO2 100% Physical Exam  Constitutional: She is oriented to person, place, and time. She appears well-developed and well-nourished.  HENT:  Head: Normocephalic and atraumatic.  Right Ear: External ear normal.  Left Ear: External ear normal.  Nose: Nose normal.  Eyes: Right eye exhibits no discharge. Left eye exhibits no discharge.  Neck: Neck supple.  Cardiovascular: Normal rate, regular rhythm and  normal heart sounds.   Pulmonary/Chest: Effort normal and breath sounds normal. She has no wheezes. She has no rales.  Abdominal: Soft. There is tenderness in the left lower quadrant.  Musculoskeletal:       Lumbar back: She exhibits tenderness (diffuse tenderness in low back, L > R).  Neurological: She is alert and oriented to person, place, and time.  Reflex Scores:      Patellar reflexes are 2+ on the right side and 2+ on the left side.      Achilles reflexes are 2+ on the right side and 2+ on the left side. 5/5 strength in bilateral lower extremities. Normal gross sensation.  Skin: Skin is warm and dry.  Nursing note and vitals reviewed.   ED Course  Procedures (including critical care time) Labs Review Labs Reviewed  COMPREHENSIVE METABOLIC PANEL - Abnormal; Notable for the following:    Glucose, Bld 126 (*)    Calcium 8.7 (*)    All other components within normal limits  LIPASE, BLOOD  TROPONIN I  CBC WITH DIFFERENTIAL/PLATELET  D-DIMER, QUANTITATIVE (NOT AT [facility])  URINALYSIS, ROUTINE W REFLEX MICROSCOPIC (NOT AT [facility])  POC URINE PREG, ED    Imaging Review Dg Chest 2 View  02/20/2015  CLINICAL DATA:  Shortness of Breath EXAM: CHEST - 2 VIEW COMPARISON:  06/30/2014 FINDINGS: The heart size and mediastinal contours are within normal limits. Both lungs are clear. The visualized skeletal structures are unremarkable. IMPRESSION: No active disease. Electronically Signed   By: Alcide Clever M.D.   On: 02/20/2015 11:22   Ct Abdomen Pelvis W Contrast  02/20/2015  CLINICAL DATA:  40 year old female with left flank pain for 3 days, increasing. HIV. Initial encounter. EXAM: CT ABDOMEN AND PELVIS WITH CONTRAST TECHNIQUE: Multidetector CT imaging of the abdomen and pelvis was performed using the standard protocol following bolus administration of intravenous contrast. CONTRAST:  OMNIPAQUE IOHEXOL 300 MG/ML  SOLN COMPARISON:  Chest radiographs today. FINDINGS: Negative lung bases.  No  pericardial or pleural effusion. No acute osseous abnormality identified. Physiologic appearance of the uterus and adnexa. No pelvic free fluid. Distended but otherwise unremarkable urinary bladder. Small calcified pre rectal lymph node versus calcified or opacified distal colon diverticulum on series 2, image 60. No rectal inflammation. Retained stool. Retained stool in the sigmoid colon. Redundant colon at the junction of the sigmoid and descending segments. Retained stool throughout the more proximal colon. The transverse and hepatic flexure segments also are redundant. Normal retrocecal appendix. Negative terminal ileum. Oral contrast has not yet reached the distal small bowel. No dilated or abnormal small bowel loops. The stomach is distended with contrast. Duodenum is decompressed. No abdominal free air or free fluid. Liver, gallbladder, spleen, pancreas and adrenal glands are within normal limits. Portal venous system is patent. Major arterial structures appear normal. No lymphadenopathy. Both kidneys appear within normal limits. Small bilateral pelvic phleboliths. IMPRESSION: No acute or inflammatory process identified. Redundant colon with retained stool throughout. Electronically Signed   By: Odessa Fleming M.D.   On: 02/20/2015 13:30  I have personally reviewed and evaluated these images and lab results as part of my medical decision-making.   EKG Interpretation   Date/Time:  Sunday February 20 2015 10:06:05 EST Ventricular Rate:  76 PR Interval:  182 QRS Duration: 99 QT Interval:  402 QTC Calculation: 452 R Axis:   20 Text Interpretation:  Sinus rhythm Low voltage, precordial leads  Borderline T abnormalities, anterior leads Baseline wander in lead(s) V3  V5 No old tracing to compare Confirmed by Eadie Repetto  MD, Jalana Moore ([id number]) on  02/20/2015 10:14:52 AM      MDM   Final diagnoses:  Abdominal pain, LLQ (left lower quadrant)  Left-sided low back pain with left-sided sciatica    Patient  feels better but is still having left-sided abdominal and back pain. Pain is more lateral, no pelvic tenderness. CT does not show any obvious pathology and does not show any abnormal pelvic organs. I discussed that there could be a missed ovarian pathology, discussed possible pelvic exam and ultrasound. Patient declines. She understands she could be missing an ovarian pathology such as torsion although I think this is much less likely. She still declines GU exam and ultrasound. Discussed strict return precautions and recommend close f/u with PCP.    Pricilla Loveless, MD 02/20/15 1623

## 2015-02-20 NOTE — ED Notes (Signed)
Dr. Criss Alvine made aware of pt's request for pain medication.

## 2015-03-15 ENCOUNTER — Encounter: Payer: Self-pay | Admitting: Internal Medicine

## 2015-03-15 ENCOUNTER — Ambulatory Visit (INDEPENDENT_AMBULATORY_CARE_PROVIDER_SITE_OTHER): Payer: BLUE CROSS/BLUE SHIELD | Admitting: Internal Medicine

## 2015-03-15 ENCOUNTER — Other Ambulatory Visit (HOSPITAL_COMMUNITY)
Admission: RE | Admit: 2015-03-15 | Discharge: 2015-03-15 | Disposition: A | Discharge status: Home / Self Care | Payer: BLUE CROSS/BLUE SHIELD | Source: Ambulatory Visit | Attending: Internal Medicine | Admitting: Internal Medicine

## 2015-03-15 VITALS — BP 116/88 | HR 81 | Temp 98.2°F | Resp 16 | Wt 144.0 lb

## 2015-03-15 DIAGNOSIS — Z124 Encounter for screening for malignant neoplasm of cervix: Secondary | ICD-10-CM | POA: Diagnosis not present

## 2015-03-15 DIAGNOSIS — Z01411 Encounter for gynecological examination (general) (routine) with abnormal findings: Secondary | ICD-10-CM | POA: Diagnosis present

## 2015-03-15 DIAGNOSIS — N760 Acute vaginitis: Secondary | ICD-10-CM | POA: Diagnosis present

## 2015-03-15 DIAGNOSIS — Z21 Asymptomatic human immunodeficiency virus [HIV] infection status: Secondary | ICD-10-CM

## 2015-03-15 DIAGNOSIS — Z113 Encounter for screening for infections with a predominantly sexual mode of transmission: Secondary | ICD-10-CM | POA: Insufficient documentation

## 2015-03-15 DIAGNOSIS — G894 Chronic pain syndrome: Secondary | ICD-10-CM | POA: Diagnosis not present

## 2015-03-15 DIAGNOSIS — Z1151 Encounter for screening for human papillomavirus (HPV): Secondary | ICD-10-CM | POA: Diagnosis not present

## 2015-03-15 DIAGNOSIS — R768 Other specified abnormal immunological findings in serum: Secondary | ICD-10-CM

## 2015-03-15 MED ORDER — PREDNISONE 20 MG PO TABS: 20.0000 mg | ORAL_TABLET | Freq: Every day | ORAL | Status: DC

## 2015-03-15 MED ORDER — TRAMADOL HCL 50 MG PO TABS: 50.0000 mg | ORAL_TABLET | Freq: Two times a day (BID) | ORAL | Status: DC

## 2015-03-15 NOTE — Progress Notes (Signed)
Patient is here for FU with Pain.  Patient complains of lower back pain being present.

## 2015-03-15 NOTE — Progress Notes (Signed)
Tina Huber, is a 40 y.o. female  KVQ:[id number]  IEP:[id number]  DOB - 01-Nov-1975  CC:  Chief Complaint  Patient presents with  . Follow-up    Pain        HPI: Tina Huber is a 40 y.o. female here today to for follow up visit. Patient had history HIV positive (not on anti-viral, CD4 count of 720,  being followed by ID), chronic pain syndrome, and Sickle Cell trait. Patient was seen in the ED on 02/20/15 for abdominal and back pain. Extensive diagnostic work up completed including CT of abdomen and pelvis and labs found all WNL. Pain resolved with ULTRAM.  Patient presents today with pain in lower back and left leg, rated as 3 or 4 of 10. Patient describes pain as sporadic, sharp pains that occur irregularly and is moderately relieved with Tramadol. Patient has positive ANA and will be referred to Rheumatology. Patient also with questions regarding continued amenorrhea following discontinuation of Depo-provera in September, 2016. Would like to consider progesterone challenge at next visit. Patient has No headache, No chest pain, No abdominal pain - No Nausea, No new weakness tingling or numbness, No Cough - SOB.  Allergies  Allergen Reactions  . Quinine Derivatives Itching   Past Medical History  Diagnosis Date  . Sickle cell anemia (HCC)    Current Outpatient Prescriptions on File Prior to Visit  Medication Sig Dispense Refill  . HYDROcodone-acetaminophen (NORCO) 5-325 MG tablet Take 1-2 tablets by mouth every 6 (six) hours as needed for severe pain. 15 tablet 0  . Multiple Vitamins-Minerals (MULTIVITAMIN WITH MINERALS) tablet Take 1 tablet by mouth daily.    Marland Kitchen ibuprofen (ADVIL,MOTRIN) 800 MG tablet Take 1 tablet (800 mg total) by mouth every 6 (six) hours as needed. (Patient not taking: Reported on 02/20/2015) 30 tablet 0  . medroxyPROGESTERone (DEPO-PROVERA) 150 MG/ML injection Inject 150 mg into the muscle every 3 (three) months. Reported on 03/15/2015    . potassium chloride  (K-DUR) 10 MEQ tablet Take 2 tablets (20 mEq total) by mouth 2 (two) times daily. (Patient not taking: Reported on 12/07/2014) 30 tablet 1  . Vitamin D, Ergocalciferol, (DRISDOL) 50000 UNITS CAPS capsule Take 1 capsule (50,000 Units total) by mouth every 7 (seven) days. (Patient not taking: Reported on 02/20/2015) 30 capsule 3   No current facility-administered medications on file prior to visit.   Family History  Problem Relation Age of Onset  . Stroke Brother   . Diabetes Paternal Uncle    Social History   Social History  . Marital Status: Married    Spouse Name: N/A  . Number of Children: N/A  . Years of Education: N/A   Occupational History  . LPN    Social History Main Topics  . Smoking status: Never Smoker   . Smokeless tobacco: Never Used  . Alcohol Use: No  . Drug Use: No  . Sexual Activity: No     Comment: No sex for 7 years    Other Topics Concern  . Not on file   Social History Narrative    Review of Systems: Constitutional: Negative for fever, chills, diaphoresis, activity change, appetite change and fatigue. HENT: Negative for ear pain, nosebleeds, congestion, facial swelling, rhinorrhea, neck pain, neck stiffness and ear discharge.  Eyes: Negative for pain, discharge, redness, itching and visual disturbance. Respiratory: Negative for cough, choking, chest tightness, shortness of breath, wheezing and stridor.  Cardiovascular: Negative for chest pain, palpitations and leg swelling. Gastrointestinal: Negative for abdominal  distention. Genitourinary: Negative for dysuria, urgency, frequency, hematuria, flank pain, decreased urine volume, difficulty urinating and dyspareunia.  Musculoskeletal: Negative for back pain, joint swelling, arthralgia and gait problem. Neurological: Negative for dizziness, tremors, seizures, syncope, facial asymmetry, speech difficulty, weakness, light-headedness, numbness and headaches.  Hematological: Negative for adenopathy. Does not  bruise/bleed easily. Psychiatric/Behavioral: Negative for hallucinations, behavioral problems, confusion, dysphoric mood, decreased concentration and agitation.    Objective:   Filed Vitals:   03/15/15 0846  BP: 116/88  Pulse: 81  Temp: 98.2 F (36.8 C)  Resp: 16    Physical Exam: Constitutional: Patient appears well-developed and well-nourished. No distress. HENT: Normocephalic, atraumatic, External right and left ear normal. Oropharynx is clear and moist.  Eyes: Conjunctivae and EOM are normal. PERRLA, no scleral icterus. Neck: Normal ROM. Neck supple. No JVD. No tracheal deviation. No thyromegaly. CVS: RRR, S1/S2 +, no murmurs, no gallops, no carotid bruit.  Pulmonary: Effort and breath sounds normal, no stridor, rhonchi, wheezes, rales.  Abdominal: Soft. BS +, no distension, tenderness, rebound or guarding.  Musculoskeletal: Normal range of motion. No edema and no tenderness.  GU: External and Internal exam of pelvis WNL, pap smear performed. Lymphadenopathy: No lymphadenopathy noted. Neuro: Alert and oriented x 4. Normal reflexes, muscle tone coordination.  Skin: Skin is warm and dry. No rash noted. Not diaphoretic. No erythema. No pallor. Psychiatric: Normal mood and affect. Behavior, judgment, thought content normal. Pelvic Exam: Cervix normal in appearance, external genitalia normal, no adnexal masses or tenderness, no cervical motion tenderness, rectovaginal septum normal, uterus normal size, shape, and consistency and vagina normal without discharge    Lab Results  Component Value Date   WBC 4.6 02/20/2015   HGB 12.8 02/20/2015   HCT 36.2 02/20/2015   MCV 87.9 02/20/2015   PLT 205 02/20/2015   Lab Results  Component Value Date   CREATININE 0.74 02/20/2015   BUN 13 02/20/2015   NA 136 02/20/2015   K 3.6 02/20/2015   CL 105 02/20/2015   CO2 24 02/20/2015    No results found for: HGBA1C Lipid Panel     Component Value Date/Time   CHOL 190 12/07/2014 1101    TRIG 52 12/07/2014 1101   HDL 63 12/07/2014 1101   CHOLHDL 3.0 12/07/2014 1101   VLDL 10 12/07/2014 1101   LDLCALC 117 12/07/2014 1101       Assessment and plan:   Bristyl was seen today for follow-up.  Diagnoses and all orders for this visit:  Chronic pain syndrome -     traMADol (ULTRAM) 50 MG tablet; Take 1 tablet (50 mg total) by mouth 2 (two) times daily. -     predniSONE (DELTASONE) 20 MG tablet; Take 1 tablet (20 mg total) by mouth daily with breakfast. -     Ambulatory referral to Rheumatology  ANA positive -     predniSONE (DELTASONE) 20 MG tablet; Take 1 tablet (20 mg total) by mouth daily with breakfast.  Asymptomatic HIV infection (HCC)  Pap smear for cervical cancer screening -     Cytology - PAP -     Cervicovaginal ancillary only  Patient has had chronic pain mostly involving the bones and joints, and a positive ANA, weakly positive CCP and weak anti-double-stranded DNA positive, will refer patient to rheumatologist for evaluation of possible Mixed Connective Tissue Disorder. Meanwhile, will give a trial of prednisone  Return in about 3 months (around 06/12/2015) for Follow up Pain and comorbidities.  The patient was given clear instructions to  go to ER or return to medical center if symptoms don't improve, worsen or new problems develop. The patient verbalized understanding. The patient was told to call to get lab results if they haven't heard anything in the next week.     Stephanie Coup, AGNP-Student [facility] [phone] 03/15/2015, 9:42 AM Evaluation and management procedures were performed by the Advanced Practitioner under my supervision and collaboration. I have reviewed the Advanced Practitioner's note and chart, and I agree with the management and plan.   Jeanann Lewandowsky, MD, MHA, CPE, FACP, FAAP [facility] [place], [place] [phone]   03/15/2015, 6:14 PM

## 2015-03-15 NOTE — Patient Instructions (Signed)

## 2015-03-16 LAB — CYTOLOGY - PAP

## 2015-03-21 ENCOUNTER — Other Ambulatory Visit: Payer: Self-pay | Admitting: Internal Medicine

## 2015-03-21 ENCOUNTER — Telehealth: Payer: Self-pay | Admitting: *Deleted

## 2015-03-21 DIAGNOSIS — N870 Mild cervical dysplasia: Secondary | ICD-10-CM

## 2015-03-21 MED ORDER — METRONIDAZOLE 500 MG PO TABS: 500.0000 mg | ORAL_TABLET | Freq: Two times a day (BID) | ORAL | Status: AC

## 2015-03-21 NOTE — Telephone Encounter (Signed)
Patient verified DOB Patient informed of Pap Smear showing BV. Patient advised to pickup Flagyl which was sent to [facility]. Patient also informed of HPV being present which will need to be treated by Gynecology by completing a colposcopy. Patient made aware of receiving a FU phone call in regards to appointment times and dates. Patient expressed her understanding and had no further questions at this time.

## 2015-03-21 NOTE — Telephone Encounter (Signed)
-----   Message from Quentin Angst, MD sent at 03/21/2015 12:43 PM EST ----- Please inform patient that her Pap smear is positive for bacterial vaginosis, not a sexually transmitted disease, treatable with Flagyl. Her Pap smear shows positive HPV and an abnormality that needs to be treated by gynecologist. We will refer patient for colposcopy.  Flagyl has been prescribed to the pharmacy for pickup.

## 2015-03-21 NOTE — Telephone Encounter (Signed)
MA unable to leave message due to VM being full.

## 2015-03-23 ENCOUNTER — Encounter: Payer: Self-pay | Admitting: Obstetrics & Gynecology

## 2015-03-23 LAB — CERVICOVAGINAL ANCILLARY ONLY
CANDIDA VAGINITIS: NEGATIVE
Chlamydia: NEGATIVE
NEISSERIA GONORRHEA: NEGATIVE
TRICH (WINDOWPATH): NEGATIVE

## 2015-04-22 ENCOUNTER — Other Ambulatory Visit (HOSPITAL_COMMUNITY)
Admission: RE | Admit: 2015-04-22 | Discharge: 2015-04-22 | Disposition: A | Discharge status: Home / Self Care | Payer: BLUE CROSS/BLUE SHIELD | Source: Ambulatory Visit | Attending: Obstetrics & Gynecology | Admitting: Obstetrics & Gynecology

## 2015-04-22 ENCOUNTER — Encounter: Payer: Self-pay | Admitting: Obstetrics & Gynecology

## 2015-04-22 ENCOUNTER — Ambulatory Visit (INDEPENDENT_AMBULATORY_CARE_PROVIDER_SITE_OTHER): Payer: BLUE CROSS/BLUE SHIELD | Admitting: Obstetrics & Gynecology

## 2015-04-22 VITALS — BP 110/75 | HR 72 | Temp 98.5°F | Wt 143.1 lb

## 2015-04-22 DIAGNOSIS — R896 Abnormal cytological findings in specimens from other organs, systems and tissues: Secondary | ICD-10-CM

## 2015-04-22 DIAGNOSIS — IMO0002 Reserved for concepts with insufficient information to code with codable children: Secondary | ICD-10-CM

## 2015-04-22 DIAGNOSIS — N870 Mild cervical dysplasia: Secondary | ICD-10-CM | POA: Insufficient documentation

## 2015-04-22 NOTE — Progress Notes (Signed)
Patient ID: Tina Huber, female   DOB: [birth year], 40 y.o.   MRN: [id number] Patient given informed consent, signed copy in the chart, time out was performed.  Placed in lithotomy position. Cervix viewed with speculum and colposcope after application of acetic acid.  03/15/2015 Adequacy Reason Satisfactory for evaluation, endocervical/transformation zone component PRESENT. Diagnosis LOW GRADE SQUAMOUS INTRAEPITHELIAL LESION: CIN-1/ HPV (LSIL). Colposcopy adequate?  yes Acetowhite lesions?yes Punctation?no Mosaicism? no Abnormal vasculature? no Biopsies?yes ECC?yes  Patient was given post procedure instructions.  We will f/u with her by telephone re results.  D/w pt LEEP if needed.[name], M.D., Evern Core

## 2015-04-22 NOTE — Patient Instructions (Signed)

## 2015-04-25 ENCOUNTER — Telehealth: Payer: Self-pay

## 2015-04-25 ENCOUNTER — Telehealth: Payer: Self-pay | Admitting: Family Medicine

## 2015-04-25 LAB — POCT PREGNANCY, URINE: Preg Test, Ur: NEGATIVE

## 2015-04-25 NOTE — Telephone Encounter (Signed)
Returning a nurse call

## 2015-04-25 NOTE — Telephone Encounter (Signed)
Per Dr. Erin Fulling, pt 's bx resulted low grade and recommends f/u in 1 yr for pap with HPV testing.  Attempted to contact pt and was unable to leave message due to VM box not set up.  Letter sent.

## 2015-04-26 NOTE — Telephone Encounter (Signed)
Returning a nurse call

## 2015-04-27 NOTE — Telephone Encounter (Signed)
Per review attempt was previously made to reach patient and letter was sent.

## 2015-05-31 ENCOUNTER — Other Ambulatory Visit (INDEPENDENT_AMBULATORY_CARE_PROVIDER_SITE_OTHER): Payer: BLUE CROSS/BLUE SHIELD

## 2015-05-31 DIAGNOSIS — Z21 Asymptomatic human immunodeficiency virus [HIV] infection status: Secondary | ICD-10-CM

## 2015-06-01 LAB — T-HELPER CELL (CD4) - (RCID CLINIC ONLY)
CD4 % Helper T Cell: 41 % (ref 33–55)
CD4 T Cell Abs: 860 /uL (ref 400–2700)

## 2015-06-02 LAB — HIV-1 RNA QUANT-NO REFLEX-BLD

## 2015-06-13 ENCOUNTER — Ambulatory Visit (INDEPENDENT_AMBULATORY_CARE_PROVIDER_SITE_OTHER): Payer: BLUE CROSS/BLUE SHIELD | Admitting: Internal Medicine

## 2015-06-13 VITALS — BP 112/79 | HR 102 | Temp 98.6°F | Wt 150.0 lb

## 2015-06-13 DIAGNOSIS — Z23 Encounter for immunization: Secondary | ICD-10-CM | POA: Diagnosis not present

## 2015-06-13 DIAGNOSIS — D573 Sickle-cell trait: Secondary | ICD-10-CM

## 2015-06-13 DIAGNOSIS — Z21 Asymptomatic human immunodeficiency virus [HIV] infection status: Secondary | ICD-10-CM

## 2015-06-13 DIAGNOSIS — Z113 Encounter for screening for infections with a predominantly sexual mode of transmission: Secondary | ICD-10-CM

## 2015-06-13 DIAGNOSIS — E559 Vitamin D deficiency, unspecified: Secondary | ICD-10-CM

## 2015-06-13 NOTE — Assessment & Plan Note (Signed)
Now established with a PCP.

## 2015-06-13 NOTE — Progress Notes (Signed)
CC: Follow up for HIV  Interval history: Currently is asymptomatic and continued viral load < 20 without treatment.  Since last visit has established with a PCP.  Has no associated wieght loss, diarrhea. Also has sickle cell trait.    Prior to Admission medications   Medication Sig Start Date End Date Taking? Authorizing Provider  HYDROcodone-acetaminophen (NORCO) 5-325 MG tablet Take 1-2 tablets by mouth every 6 (six) hours as needed for severe pain. 02/20/15  Yes Pricilla Loveless, MD  ibuprofen (ADVIL,MOTRIN) 800 MG tablet Take 1 tablet (800 mg total) by mouth every 6 (six) hours as needed. 01/13/13  Yes Altha Harm, MD  Multiple Vitamins-Minerals (MULTIVITAMIN WITH MINERALS) tablet Take 1 tablet by mouth daily.   Yes Historical Provider, MD  traMADol (ULTRAM) 50 MG tablet Take 1 tablet (50 mg total) by mouth 2 (two) times daily. 03/15/15  Yes Quentin Angst, MD  Vitamin D, Ergocalciferol, (DRISDOL) 50000 UNITS CAPS capsule Take 1 capsule (50,000 Units total) by mouth every 7 (seven) days. 01/27/13  Yes Altha Harm, MD  medroxyPROGESTERone (DEPO-PROVERA) 150 MG/ML injection Inject 150 mg into the muscle every 3 (three) months. Reported on 06/13/2015    Historical Provider, MD  potassium chloride (K-DUR) 10 MEQ tablet Take 2 tablets (20 mEq total) by mouth 2 (two) times daily. Patient not taking: Reported on 12/07/2014 01/15/12   Grayce Sessions, NP  predniSONE (DELTASONE) 20 MG tablet Take 1 tablet (20 mg total) by mouth daily with breakfast. Patient not taking: Reported on 04/22/2015 03/15/15   Quentin Angst, MD    Review of Systems Constitutional: negative for fatigue and malaise Gastrointestinal: negative for diarrhea Musculoskeletal: negative for myalgias and arthralgias All other systems reviewed and are negative   Physical Exam: CONSTITUTIONAL:in no apparent distress and alert;  Eyes: anicteric HENT: no thrush, no cervical lymphadenopathy Respiratory: Normal  respiratory effort; CTA B  Lab Results  Component Value Date   HIV1RNAQUANT <20 05/31/2015   HIV1RNAQUANT <20 01/20/2015   No components found for: HIV1GENOTYPRPLUS No components found for: THELPERCELL

## 2015-06-13 NOTE — Assessment & Plan Note (Signed)
On treatment.  

## 2015-06-13 NOTE — Assessment & Plan Note (Signed)
Continues as an Engineer, agricultural.  Has a positive antibody and detectable virus but < 20.  RTC 6 months.

## 2015-06-14 ENCOUNTER — Ambulatory Visit (INDEPENDENT_AMBULATORY_CARE_PROVIDER_SITE_OTHER): Payer: BLUE CROSS/BLUE SHIELD | Admitting: Internal Medicine

## 2015-06-14 ENCOUNTER — Encounter: Payer: Self-pay | Admitting: Internal Medicine

## 2015-06-14 VITALS — BP 110/70 | HR 86 | Temp 98.6°F | Resp 18 | Ht 60.0 in | Wt 150.0 lb

## 2015-06-14 DIAGNOSIS — G894 Chronic pain syndrome: Secondary | ICD-10-CM | POA: Diagnosis not present

## 2015-06-14 DIAGNOSIS — Z21 Asymptomatic human immunodeficiency virus [HIV] infection status: Secondary | ICD-10-CM

## 2015-06-14 MED ORDER — ACETAMINOPHEN-CODEINE #3 300-30 MG PO TABS: 1.0000 | ORAL_TABLET | Freq: Four times a day (QID) | ORAL | Status: DC | PRN

## 2015-06-14 NOTE — Progress Notes (Signed)
Patient ID: Tina Huber, female   DOB: [birth year], 40 y.o.   MRN: [id number]   [name], is a 40 y.o. female  [id number]  [id number]  DOB - 1975/06/23  Chief Complaint  Patient presents with  . Follow-up        Subjective:   Tina Huber is a 40 y.o. female with history of asymptomatic HIV infection and sickle cell trait syndrome with chronic pain here today for a follow up visit. She is complaining of ongoing bilateral lower extremities pain mostly in her joints and bone. Patient has no fever. Patient follows up with infectious disease, HIV copies undetectable. Patient has No headache, No chest pain, No abdominal pain - No Nausea, No new weakness tingling or numbness, No Cough - SOB.  No problems updated.  ALLERGIES: Allergies  Allergen Reactions  . Quinine Derivatives Itching    PAST MEDICAL HISTORY: Past Medical History  Diagnosis Date  . Sickle cell anemia (HCC)     MEDICATIONS AT HOME: Prior to Admission medications   Medication Sig Start Date End Date Taking? Authorizing Provider  ibuprofen (ADVIL,MOTRIN) 800 MG tablet Take 1 tablet (800 mg total) by mouth every 6 (six) hours as needed. 01/13/13  Yes Altha Harm, MD  Multiple Vitamins-Minerals (MULTIVITAMIN WITH MINERALS) tablet Take 1 tablet by mouth daily.   Yes Historical Provider, MD  Vitamin D, Ergocalciferol, (DRISDOL) 50000 UNITS CAPS capsule Take 1 capsule (50,000 Units total) by mouth every 7 (seven) days. 01/27/13  Yes Altha Harm, MD  acetaminophen-codeine (TYLENOL #3) 300-30 MG tablet Take 1 tablet by mouth every 6 (six) hours as needed for moderate pain. 06/14/15   Quentin Angst, MD  medroxyPROGESTERone (DEPO-PROVERA) 150 MG/ML injection Inject 150 mg into the muscle every 3 (three) months. Reported on 06/14/2015    Historical Provider, MD  potassium chloride (K-DUR) 10 MEQ tablet Take 2 tablets (20 mEq total) by mouth 2 (two) times daily. Patient not taking: Reported on  12/07/2014 01/15/12   Grayce Sessions, NP     Objective:   Filed Vitals:   06/14/15 0842  BP: 110/70  Pulse: 86  Temp: 98.6 F (37 C)  TempSrc: Oral  Resp: 18  Height: 5' (1.524 m)  Weight: 150 lb (68.04 kg)  SpO2: 100%    Exam General appearance : Awake, alert, not in any distress. Speech Clear. Not toxic looking HEENT: Atraumatic and Normocephalic, pupils equally reactive to light and accomodation Neck: supple, no JVD. No cervical lymphadenopathy.  Chest:Good air entry bilaterally, no added sounds  CVS: S1 S2 regular, no murmurs.  Abdomen: Bowel sounds present, Non tender and not distended with no gaurding, rigidity or rebound. Extremities: B/L Lower Ext shows no edema, both legs are warm to touch Neurology: Awake alert, and oriented X 3, CN II-XII intact, Non focal Skin: No Rash  Data Review No results found for: HGBA1C   Assessment & Plan   1. Chronic pain syndrome  - acetaminophen-codeine (TYLENOL #3) 300-30 MG tablet; Take 1 tablet by mouth every 6 (six) hours as needed for moderate pain.  Dispense: 60 tablet; Refill: 0  2. Asymptomatic HIV infection (HCC)  Patient remains asymptomatic, HIV copies undetectable with a viral load less than 20 Continue to follow-up with infectious disease as scheduled  Patient have been counseled extensively about nutrition and exercise  Return in about 6 months (around 12/15/2015) for Follow up Pain and comorbidities.  The patient was given clear instructions to go to ER or return to medical  center if symptoms don't improve, worsen or new problems develop. The patient verbalized understanding. The patient was told to call to get lab results if they haven't heard anything in the next week.   This note has been created with Education officer, environmental. Any transcriptional errors are unintentional.    Jeanann Lewandowsky, MD, MHA, Maxwell Caul, CPE [facility]  [place], [place] [phone]   06/14/2015, 9:17 AM

## 2015-06-14 NOTE — Patient Instructions (Signed)

## 2015-06-14 NOTE — Progress Notes (Signed)
Patient is here for FU  Patient complains of bilateral leg pain. Throbbing constant pain. Pain is scaled at an 8.

## 2015-06-15 ENCOUNTER — Other Ambulatory Visit: Payer: Self-pay

## 2015-06-15 NOTE — Addendum Note (Signed)
Addended by: Rejeana Brock A on: 06/15/2015 12:35 PM   Modules accepted: Orders

## 2015-06-15 NOTE — Addendum Note (Signed)
Addended by: Rejeana Brock A on: 06/15/2015 12:18 PM   Modules accepted: Orders

## 2015-07-04 ENCOUNTER — Telehealth: Payer: Self-pay | Admitting: Internal Medicine

## 2015-07-04 ENCOUNTER — Encounter: Payer: Self-pay | Admitting: Internal Medicine

## 2015-07-04 NOTE — Telephone Encounter (Signed)
Pt. Called stating that she needs to have an order for a chest x-ray b/c  She can not do the TB test. Please f/u with pt.

## 2015-07-12 ENCOUNTER — Other Ambulatory Visit: Payer: Self-pay | Admitting: Internal Medicine

## 2015-07-12 DIAGNOSIS — R7611 Nonspecific reaction to tuberculin skin test without active tuberculosis: Secondary | ICD-10-CM

## 2015-07-12 NOTE — Telephone Encounter (Signed)
MA responded to patients emailed regarding chest xray

## 2015-07-12 NOTE — Telephone Encounter (Signed)
CXR  Ordered

## 2015-10-21 ENCOUNTER — Telehealth: Payer: Self-pay

## 2015-10-24 ENCOUNTER — Other Ambulatory Visit: Payer: Self-pay | Admitting: Internal Medicine

## 2015-10-24 DIAGNOSIS — G894 Chronic pain syndrome: Secondary | ICD-10-CM

## 2015-10-24 MED ORDER — ACETAMINOPHEN-CODEINE #3 300-30 MG PO TABS
1.0000 | ORAL_TABLET | Freq: Four times a day (QID) | ORAL | 0 refills | Status: DC | PRN
Start: 2015-10-24 — End: 2015-10-28

## 2015-10-25 ENCOUNTER — Other Ambulatory Visit: Payer: Self-pay | Admitting: Internal Medicine

## 2015-10-25 DIAGNOSIS — G894 Chronic pain syndrome: Secondary | ICD-10-CM

## 2015-10-28 ENCOUNTER — Other Ambulatory Visit: Payer: Self-pay | Admitting: Internal Medicine

## 2015-10-28 DIAGNOSIS — G894 Chronic pain syndrome: Secondary | ICD-10-CM

## 2015-10-28 MED ORDER — ACETAMINOPHEN-CODEINE #3 300-30 MG PO TABS: 1.0000 | ORAL_TABLET | Freq: Four times a day (QID) | ORAL | 0 refills | Status: DC | PRN

## 2015-11-30 ENCOUNTER — Other Ambulatory Visit: Payer: Managed Care, Other (non HMO)

## 2015-11-30 DIAGNOSIS — Z113 Encounter for screening for infections with a predominantly sexual mode of transmission: Secondary | ICD-10-CM

## 2015-11-30 DIAGNOSIS — Z21 Asymptomatic human immunodeficiency virus [HIV] infection status: Secondary | ICD-10-CM

## 2015-11-30 DIAGNOSIS — Z79899 Other long term (current) drug therapy: Secondary | ICD-10-CM

## 2015-12-01 LAB — LIPID PANEL
Cholesterol: 168 mg/dL (ref 125–200)
HDL: 65 mg/dL (ref >=46)
LDL CALC: 89 mg/dL (ref <130)
TRIGLYCERIDES: 69 mg/dL (ref <150)
Total CHOL/HDL Ratio: 2.6 Ratio (ref <=5.0)
VLDL: 14 mg/dL (ref <30)

## 2015-12-01 LAB — T-HELPER CELL (CD4) - (RCID CLINIC ONLY)
CD4 T CELL ABS: 720 /uL (ref 400–2700)
CD4 T CELL HELPER: 39 % (ref 33–55)

## 2015-12-01 LAB — RPR

## 2015-12-02 LAB — HIV-1 RNA QUANT-NO REFLEX-BLD

## 2015-12-15 ENCOUNTER — Ambulatory Visit (INDEPENDENT_AMBULATORY_CARE_PROVIDER_SITE_OTHER): Payer: Managed Care, Other (non HMO) | Admitting: Internal Medicine

## 2015-12-15 ENCOUNTER — Encounter: Payer: Self-pay | Admitting: Family Medicine

## 2015-12-15 ENCOUNTER — Ambulatory Visit (INDEPENDENT_AMBULATORY_CARE_PROVIDER_SITE_OTHER): Payer: Managed Care, Other (non HMO) | Admitting: Family Medicine

## 2015-12-15 VITALS — BP 110/76 | HR 91 | Temp 98.7°F | Wt 157.0 lb

## 2015-12-15 VITALS — BP 118/70 | HR 108 | Temp 99.0°F | Resp 16 | Ht 60.0 in | Wt 157.0 lb

## 2015-12-15 DIAGNOSIS — D571 Sickle-cell disease without crisis: Secondary | ICD-10-CM | POA: Diagnosis not present

## 2015-12-15 DIAGNOSIS — Z21 Asymptomatic human immunodeficiency virus [HIV] infection status: Secondary | ICD-10-CM | POA: Diagnosis not present

## 2015-12-15 DIAGNOSIS — Z23 Encounter for immunization: Secondary | ICD-10-CM | POA: Diagnosis not present

## 2015-12-15 DIAGNOSIS — Z13220 Encounter for screening for lipoid disorders: Secondary | ICD-10-CM

## 2015-12-15 DIAGNOSIS — M25552 Pain in left hip: Secondary | ICD-10-CM | POA: Diagnosis not present

## 2015-12-15 DIAGNOSIS — Z124 Encounter for screening for malignant neoplasm of cervix: Secondary | ICD-10-CM

## 2015-12-15 DIAGNOSIS — E559 Vitamin D deficiency, unspecified: Secondary | ICD-10-CM

## 2015-12-15 LAB — CBC WITH DIFFERENTIAL/PLATELET
BASOS PCT: 0 %
Basophils Absolute: 0 cells/uL (ref 0–200)
EOS ABS: 54 {cells}/uL (ref 15–500)
Eosinophils Relative: 1 %
HCT: 36.6 % (ref 35.0–45.0)
Hemoglobin: 12.4 g/dL (ref 11.7–15.5)
LYMPHS PCT: 36 %
Lymphs Abs: 1944 cells/uL (ref 850–3900)
MCH: 30.3 pg (ref 27.0–33.0)
MCHC: 33.9 g/dL (ref 32.0–36.0)
MCV: 89.5 fL (ref 80.0–100.0)
MONO ABS: 540 {cells}/uL (ref 200–950)
MONOS PCT: 10 %
MPV: 10.2 fL (ref 7.5–12.5)
NEUTROS ABS: 2862 {cells}/uL (ref 1500–7800)
Neutrophils Relative %: 53 %
PLATELETS: 244 10*3/uL (ref 140–400)
RBC: 4.09 MIL/uL (ref 3.80–5.10)
RDW: 13.9 % (ref 11.0–15.0)
WBC: 5.4 10*3/uL (ref 3.8–10.8)

## 2015-12-15 LAB — COMPLETE METABOLIC PANEL WITH GFR
ALT: 29 U/L (ref 6–29)
AST: 26 U/L (ref 10–30)
Albumin: 4.2 g/dL (ref 3.6–5.1)
Alkaline Phosphatase: 63 U/L (ref 33–115)
BILIRUBIN TOTAL: 0.3 mg/dL (ref 0.2–1.2)
BUN: 12 mg/dL (ref 7–25)
CHLORIDE: 104 mmol/L (ref 98–110)
CO2: 26 mmol/L (ref 20–31)
Calcium: 9.3 mg/dL (ref 8.6–10.2)
Creat: 0.87 mg/dL (ref 0.50–1.10)
GFR, EST NON AFRICAN AMERICAN: 84 mL/min (ref >=60)
GLUCOSE: 115 mg/dL — AB (ref 65–99)
POTASSIUM: 3.6 mmol/L (ref 3.5–5.3)
SODIUM: 137 mmol/L (ref 135–146)
TOTAL PROTEIN: 7.7 g/dL (ref 6.1–8.1)

## 2015-12-15 LAB — LIPID PANEL
CHOL/HDL RATIO: 3.2 ratio (ref <5.0)
Cholesterol: 187 mg/dL (ref <200)
HDL: 59 mg/dL (ref >50)
LDL CALC: 107 mg/dL — AB (ref <100)
TRIGLYCERIDES: 107 mg/dL (ref <150)
VLDL: 21 mg/dL (ref <30)

## 2015-12-15 LAB — RETICULOCYTES
ABS RETIC: 49080 {cells}/uL (ref 20000–80000)
RBC.: 4.09 MIL/uL (ref 3.80–5.10)
Retic Ct Pct: 1.2 %

## 2015-12-16 NOTE — Progress Notes (Signed)
CC: Follow up for HIV  Interval history: Currently is asymptomatic and continued viral load < 20 without treatment.   Has no associated wieght loss, diarrhea. Also has sickle cell trait.    Prior to Admission medications   Medication Sig Start Date End Date Taking? Authorizing Provider  HYDROcodone-acetaminophen (NORCO) 5-325 MG tablet Take 1-2 tablets by mouth every 6 (six) hours as needed for severe pain. 02/20/15  Yes Pricilla Loveless, MD  ibuprofen (ADVIL,MOTRIN) 800 MG tablet Take 1 tablet (800 mg total) by mouth every 6 (six) hours as needed. 01/13/13  Yes Altha Harm, MD  Multiple Vitamins-Minerals (MULTIVITAMIN WITH MINERALS) tablet Take 1 tablet by mouth daily.   Yes Historical Provider, MD  traMADol (ULTRAM) 50 MG tablet Take 1 tablet (50 mg total) by mouth 2 (two) times daily. 03/15/15  Yes Quentin Angst, MD  Vitamin D, Ergocalciferol, (DRISDOL) 50000 UNITS CAPS capsule Take 1 capsule (50,000 Units total) by mouth every 7 (seven) days. 01/27/13  Yes Altha Harm, MD  medroxyPROGESTERone (DEPO-PROVERA) 150 MG/ML injection Inject 150 mg into the muscle every 3 (three) months. Reported on 06/13/2015    Historical Provider, MD  potassium chloride (K-DUR) 10 MEQ tablet Take 2 tablets (20 mEq total) by mouth 2 (two) times daily. Patient not taking: Reported on 12/07/2014 01/15/12   Grayce Sessions, NP  predniSONE (DELTASONE) 20 MG tablet Take 1 tablet (20 mg total) by mouth daily with breakfast. Patient not taking: Reported on 04/22/2015 03/15/15   Quentin Angst, MD    Review of Systems Constitutional: negative for fatigue and malaise Gastrointestinal: negative for diarrhea Musculoskeletal: negative for myalgias and arthralgias All other systems reviewed and are negative   Physical Exam: CONSTITUTIONAL:in no apparent distress and alert;  Eyes: anicteric HENT: no thrush, no cervical lymphadenopathy Respiratory: Normal respiratory effort; CTA B  Lab Results   Component Value Date   HIV1RNAQUANT <20 11/30/2015   HIV1RNAQUANT <20 05/31/2015   HIV1RNAQUANT <20 01/20/2015   No components found for: HIV1GENOTYPRPLUS No components found for: THELPERCELL

## 2015-12-16 NOTE — Assessment & Plan Note (Signed)
Remains welll -controlled without treatment with undetectable virus.  rtc 6 months.  Will consider recheck HIV Ab in 1-2 years by her request.

## 2015-12-16 NOTE — Assessment & Plan Note (Signed)
Reminded to continue screening

## 2015-12-19 NOTE — Progress Notes (Signed)
Tina Huber, is a 40 y.o. female  [id number]:[id number]  [id number]  DOB - 1975/05/27  CC:  Chief Complaint  Patient presents with  . Follow-up  . Foot Pain    bilateral foot pain   . Hip Pain    left hip pain        HPI: Tina Huber is a 40 y.o. female here sickle cell follow-up .She has a diagnosis of [place] trait. Her complaints today are of left hip pain and bilateral foot pain. She denies any significant change in [place] status since last visit. She reports occ headaches, no visual disturbances, no fever or shaking chills. She experiences some shortness of breath off and on for one day about 3 weeks ago. She reports occ abd pain and nausea. When she has sickle cell pain in is in her legs and trunk. She had her last short term narcotic yesterday. Does not use a long acting narcotic. She was last admitted for pain management in January 2017. She also has a diagnosis of asymptomatic HIV. Allergies  Allergen Reactions  . Quinine Derivatives Itching   Past Medical History:  Diagnosis Date  . Sickle cell anemia (HCC)    Current Outpatient Prescriptions on File Prior to Visit  Medication Sig Dispense Refill  . acetaminophen-codeine (TYLENOL #3) 300-30 MG tablet Take 1 tablet by mouth every 6 (six) hours as needed for moderate pain. 60 tablet 0  . Multiple Vitamins-Minerals (MULTIVITAMIN WITH MINERALS) tablet Take 1 tablet by mouth daily.    Marland Kitchen ibuprofen (ADVIL,MOTRIN) 800 MG tablet Take 1 tablet (800 mg total) by mouth every 6 (six) hours as needed. 30 tablet 0  . potassium chloride (K-DUR) 10 MEQ tablet Take 2 tablets (20 mEq total) by mouth 2 (two) times daily. (Patient not taking: Reported on 12/15/2015) 30 tablet 1  . Vitamin D, Ergocalciferol, (DRISDOL) 50000 UNITS CAPS capsule Take 1 capsule (50,000 Units total) by mouth every 7 (seven) days. (Patient not taking: Reported on 12/15/2015) 30 capsule 3   No current facility-administered medications on file prior to visit.    Family  History  Problem Relation Age of Onset  . Stroke Brother   . Diabetes Paternal Uncle    Social History   Social History  . Marital status: Married    Spouse name: N/A  . Number of children: N/A  . 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 Smoking status: Never Smoker  . Smokeless tobacco: Never Used  . Alcohol use No  . Drug use: No  . Sexual activity: No     Comment: No sex for 7 years    Other Topics Concern  . Not on file   Social History Narrative  . No narrative on file    Review of Systems: See HPI.  Objective:   Vitals:   12/15/15 1507  BP: 118/70  Pulse: (!) 108  Resp: 16  Temp: 99 F (37.2 C)    Physical Exam: Constitutional: Patient appears well-developed and well-nourished. No distress. HENT: Normocephalic, atraumatic, External right and left ear normal. Oropharynx is clear and moist.  Eyes: Conjunctivae and EOM are normal. PERRLA, no scleral icterus. Neck: Normal ROM. Neck supple. No lymphadenopathy, No thyromegaly. CVS: RRR, S1/S2 +, no murmurs, no gallops, no rubs Pulmonary: Effort and breath sounds normal, no stridor, rhonchi, wheezes, rales.  Abdominal: Soft. Normoactive BS,, no distension, tenderness, rebound or guarding.  Musculoskeletal: Normal range of motion.  No edema and no tenderness.  Neuro: Alert.Normal muscle tone coordination. Non-focal Skin: Skin is warm and dry. No rash noted. Not diaphoretic. No erythema. No pallor. Psychiatric: Normal mood and affect. Behavior, judgment, thought content normal.  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 Screening cholesterol level  - Lipid panel  3. Vitamin D deficiency  - Vitamin D 1,25 dihydroxy  4. Pain of left hip joint -Will order x-ray.   -Sickle Cell Disease  Patient counseled and given handout on use of opoid pain medications, including need to only get from [place] and our ability to follow use on [place]  CSRS and need to keep in a safe locked place away from children and pets. Have reviewed our refill policy related to erly refills if lost or stolen. Have reviewed possible side effects of opoids, Hydrea and need to take other SCD related medications as ordered.  Have review health maintenance needs, including immunizations, urine for proteim, dilated eye exam.  Have review the importance of smoking cessation if currently smoking.   The patient was given clear instructions to go to ER or return to medical center if symptoms don't improve, worsen or new problems develop. The patient verbalized understanding. The patient was told to call to get lab results if they haven't heard anything in the next week.     No Follow-up on file.       Henrietta Hoover, MSN, FNP-BC   12/19/2015, 12:43 PM

## 2015-12-27 ENCOUNTER — Ambulatory Visit (INDEPENDENT_AMBULATORY_CARE_PROVIDER_SITE_OTHER): Payer: Managed Care, Other (non HMO) | Admitting: Family Medicine

## 2015-12-27 VITALS — BP 130/79 | HR 69 | Temp 98.3°F | Resp 16 | Ht 60.0 in | Wt 160.0 lb

## 2015-12-27 DIAGNOSIS — G894 Chronic pain syndrome: Secondary | ICD-10-CM | POA: Diagnosis not present

## 2015-12-27 DIAGNOSIS — J069 Acute upper respiratory infection, unspecified: Secondary | ICD-10-CM

## 2015-12-27 MED ORDER — ACETAMINOPHEN-CODEINE #3 300-30 MG PO TABS: 1.0000 | ORAL_TABLET | Freq: Four times a day (QID) | ORAL | 0 refills | Status: DC | PRN

## 2015-12-27 MED ORDER — AZITHROMYCIN 250 MG PO TABS
ORAL_TABLET | ORAL | 0 refills | Status: DC
Start: 2015-12-27 — End: 2016-07-10

## 2015-12-27 NOTE — Progress Notes (Signed)
Tina Huber, is a 40 y.o. female  [id number]  [id number]:[id number]  DOB - [birth year]  CC:  Chief Complaint  Patient presents with  . Cough    x 1 week. Taking Robatussin and Tylenol   . Fever    x 1 week        HPI: Tina Huber is a 40 y.o. female here  a sick visit. She was seen earlier in the month for routine follow-up, so today will only address current complaints. She complains of upper respiratory symptoms starting 8 days ago. Intially had a sore throat and headache and body aches. She then developed fever and cough. She states she had tylenol at 5:30 this morning and had a temp of 100.1. Current 98.6. She denies ear aches, does continue to have some nasal congestions.she does complain of back pain and request a refill of her tylenol with codiene.  Allergies  Allergen Reactions  . Quinine Derivatives Itching   Past Medical History:  Diagnosis Date  . Sickle cell anemia (HCC)    Current Outpatient Prescriptions on File Prior to Visit  Medication Sig Dispense Refill  . ibuprofen (ADVIL,MOTRIN) 800 MG tablet Take 1 tablet (800 mg total) by mouth every 6 (six) hours as needed. 30 tablet 0  . Multiple Vitamins-Minerals (MULTIVITAMIN WITH MINERALS) tablet Take 1 tablet by mouth daily.    . potassium chloride (K-DUR) 10 MEQ tablet Take 2 tablets (20 mEq total) by mouth 2 (two) times daily. (Patient not taking: Reported on 12/27/2015) 30 tablet 1  . Vitamin D, Ergocalciferol, (DRISDOL) 50000 UNITS CAPS capsule Take 1 capsule (50,000 Units total) by mouth every 7 (seven) days. (Patient not taking: Reported on 12/27/2015) 30 capsule 3   No current facility-administered medications on file prior to visit.    Family History  Problem Relation Age of Onset  . Stroke Brother   . Diabetes Paternal Uncle    Social History   Social History  . Marital status: Married    Spouse name: N/A  . Number of children: N/A  . 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 Smoking status: Never Smoker  . Smokeless tobacco: Never Used  . Alcohol use No  . Drug use: No  . Sexual activity: No     Comment: No sex for 7 years    Other Topics Concern  . Not on file   Social History Narrative  . No narrative on file    Review of Systems: See HPI  Objective:   Vitals:   12/27/15 0812  BP: 130/79  Pulse: 69  Resp: 16  Temp: 98.3 F (36.8 C)    Physical Exam: Constitutional: Patient appears well-developed and well-nourished. No distress. HENT: Normocephalic, atraumatic, External right and left ear normal. Oropharynx is clear and moist.  Eyes: Conjunctivae and EOM are normal. PERRLA, no scleral icterus. Neck: Normal ROM. Neck supple. No lymphadenopathy, No thyromegaly. CVS: RRR, S1/S2 +, no murmurs, no gallops, no rubs Pulmonary: Effort and breath sounds normal, no stridor, rhonchi, wheezes, rales.  Neuro: Alert.Normal muscle tone coordination. Non-focal Skin: Skin is warm and dry. No rash noted. Not diaphoretic. No erythema. No pallor. Psychiatric: Normal mood and affect. Behavior, judgment, thought content normal.  Lab Results  Component Value Date   WBC 5.4 12/15/2015   HGB 12.4 12/15/2015   HCT 36.6 12/15/2015   MCV 89.5 12/15/2015   PLT 244 12/15/2015   Lab Results  Component Value Date   CREATININE 0.87 12/15/2015   BUN 12 12/15/2015   NA 137 12/15/2015   K 3.6 12/15/2015   CL 104 12/15/2015   CO2 26 12/15/2015    No results found for: HGBA1C Lipid Panel     Component Value Date/Time   CHOL 187 12/15/2015 1547   TRIG 107 12/15/2015 1547   HDL 59 12/15/2015 1547   CHOLHDL 3.2 12/15/2015 1547   VLDL 21 12/15/2015 1547   LDLCALC 107 (H) 12/15/2015 1547        Assessment and plan:   1. Chronic pain syndrome  - acetaminophen-codeine (TYLENOL #3) 300-30 MG tablet; Take 1 tablet by mouth every 6 (six) hours as needed for moderate pain.  Dispense: 60 tablet; Refill: 0  2. Acute  upper respiratory infection  - azithromycin (ZITHROMAX) 250 MG tablet; Take 2 on day one, then one a day for 4 days.  Dispense: 6 tablet; Refill: 0   No Follow-up on file.  The patient was given clear instructions to go to ER or return to medical center if symptoms don't improve, worsen or new problems develop. The patient verbalized understanding.    Henrietta Hoover FNP  12/27/2015, 9:12 AM

## 2016-02-20 ENCOUNTER — Other Ambulatory Visit (HOSPITAL_COMMUNITY): Payer: Self-pay | Admitting: Internal Medicine

## 2016-02-20 ENCOUNTER — Other Ambulatory Visit: Payer: Self-pay | Admitting: Family Medicine

## 2016-02-20 DIAGNOSIS — G894 Chronic pain syndrome: Secondary | ICD-10-CM

## 2016-02-20 MED ORDER — IBUPROFEN 800 MG PO TABS: 800.0000 mg | ORAL_TABLET | Freq: Four times a day (QID) | ORAL | 0 refills | Status: DC | PRN

## 2016-02-20 NOTE — Telephone Encounter (Signed)
Patient is requesting a refill on Tylenol 3 and ibuprofen

## 2016-04-16 ENCOUNTER — Encounter: Payer: Self-pay | Admitting: Internal Medicine

## 2016-04-16 NOTE — Telephone Encounter (Signed)
Please advise 

## 2016-04-24 ENCOUNTER — Other Ambulatory Visit: Payer: Self-pay | Admitting: Family Medicine

## 2016-04-24 DIAGNOSIS — G894 Chronic pain syndrome: Secondary | ICD-10-CM

## 2016-04-24 NOTE — Telephone Encounter (Signed)
Patient request

## 2016-05-24 ENCOUNTER — Encounter: Payer: Self-pay | Admitting: Family Medicine

## 2016-05-24 ENCOUNTER — Ambulatory Visit (INDEPENDENT_AMBULATORY_CARE_PROVIDER_SITE_OTHER): Payer: Managed Care, Other (non HMO) | Admitting: Family Medicine

## 2016-05-24 VITALS — BP 126/84 | HR 103 | Temp 98.7°F | Resp 16 | Ht 60.0 in | Wt 154.0 lb

## 2016-05-24 DIAGNOSIS — G629 Polyneuropathy, unspecified: Secondary | ICD-10-CM | POA: Diagnosis not present

## 2016-05-24 DIAGNOSIS — Z21 Asymptomatic human immunodeficiency virus [HIV] infection status: Secondary | ICD-10-CM | POA: Diagnosis not present

## 2016-05-24 DIAGNOSIS — G894 Chronic pain syndrome: Secondary | ICD-10-CM

## 2016-05-24 DIAGNOSIS — D573 Sickle-cell trait: Secondary | ICD-10-CM | POA: Diagnosis not present

## 2016-05-24 LAB — CBC WITH DIFFERENTIAL/PLATELET
BASOS ABS: 0 {cells}/uL (ref 0–200)
Basophils Relative: 0 %
EOS PCT: 2 %
Eosinophils Absolute: 108 cells/uL (ref 15–500)
HCT: 36.8 % (ref 35.0–45.0)
Hemoglobin: 12.3 g/dL (ref 11.7–15.5)
LYMPHS PCT: 40 %
Lymphs Abs: 2160 cells/uL (ref 850–3900)
MCH: 29.9 pg (ref 27.0–33.0)
MCHC: 33.4 g/dL (ref 32.0–36.0)
MCV: 89.3 fL (ref 80.0–100.0)
MPV: 9.8 fL (ref 7.5–12.5)
Monocytes Absolute: 486 cells/uL (ref 200–950)
Monocytes Relative: 9 %
Neutro Abs: 2646 cells/uL (ref 1500–7800)
Neutrophils Relative %: 49 %
Platelets: 256 10*3/uL (ref 140–400)
RBC: 4.12 MIL/uL (ref 3.80–5.10)
RDW: 14.8 % (ref 11.0–15.0)
WBC: 5.4 10*3/uL (ref 3.8–10.8)

## 2016-05-24 LAB — COMPREHENSIVE METABOLIC PANEL
ALK PHOS: 68 U/L (ref 33–115)
ALT: 18 U/L (ref 6–29)
AST: 22 U/L (ref 10–30)
Albumin: 4.2 g/dL (ref 3.6–5.1)
BUN: 13 mg/dL (ref 7–25)
CALCIUM: 8.9 mg/dL (ref 8.6–10.2)
CHLORIDE: 102 mmol/L (ref 98–110)
CO2: 24 mmol/L (ref 20–31)
Creat: 0.9 mg/dL (ref 0.50–1.10)
GLUCOSE: 105 mg/dL — AB (ref 65–99)
POTASSIUM: 4.1 mmol/L (ref 3.5–5.3)
Sodium: 138 mmol/L (ref 135–146)
Total Bilirubin: 0.2 mg/dL (ref 0.2–1.2)
Total Protein: 7.6 g/dL (ref 6.1–8.1)

## 2016-05-24 LAB — POCT URINALYSIS DIP (DEVICE)
BILIRUBIN URINE: NEGATIVE
Glucose, UA: NEGATIVE mg/dL
HGB URINE DIPSTICK: NEGATIVE
Ketones, ur: NEGATIVE mg/dL
LEUKOCYTES UA: NEGATIVE
NITRITE: NEGATIVE
Protein, ur: NEGATIVE mg/dL
Specific Gravity, Urine: 1.015 (ref 1.005–1.030)
Urobilinogen, UA: 0.2 mg/dL (ref 0.0–1.0)
pH: 5.5 (ref 5.0–8.0)

## 2016-05-24 MED ORDER — ACETAMINOPHEN-CODEINE #3 300-30 MG PO TABS: 1.0000 | ORAL_TABLET | Freq: Four times a day (QID) | ORAL | 0 refills | Status: DC | PRN

## 2016-05-24 MED ORDER — GABAPENTIN 300 MG PO CAPS: 300.0000 mg | ORAL_CAPSULE | Freq: Three times a day (TID) | ORAL | 3 refills | Status: DC

## 2016-05-24 NOTE — Progress Notes (Signed)
Patient ID: Tina Huber, female    DOB: 1975/08/31, 41 y.o.   MRN: [id number]  PCP: Tina Courts, FNP  Chief Complaint  Patient presents with  . Follow-up    Sickle cell    Subjective:  HPI Tina Huber is a 41 y.o. female presents for routine follow-up.  Medical problems include: Asymptomatic HIV, Positive ANA, Sickle Cell Trait Syndrome with chronic pain.  Tina Huber today complains of bilateral feet and hand pain. These areas have been typically locations of pain although she is concerned as for last few months she has developed persistent numbness and tingling in fingers. Purchased over the counter geritol in hopes of correcting any dietary vitamin deficiency. Reports geritol only made her eat and numbness and tingling persists. She was previously prescribed tylenol # 3 and took it occassionaly as it helped with pain but caused drowsiness. Denies any chest pain, headache, dizziness, dysuria, or abdominal pain. Tina Huber continues to remain asymptomatic of HIV and requires no treatment with anti-virals. She has an upcoming appointment with Tina Huber in early May. She is currently not sexually active.  Social History   Social History  . Marital status: Married    Spouse name: N/A  . Number of children: N/A  . 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 Smoking status: Never Smoker  . Smokeless tobacco: Never Used  . Alcohol use No  . Drug use: No  . Sexual activity: No     Comment: No sex for 7 years    Other Topics Concern  . Not on file   Social History Narrative  . No narrative on file    Family History  Problem Relation Age of Onset  . Stroke Brother   . Diabetes Paternal Uncle    Review of Systems SEE HPI  Patient Active Problem List   Diagnosis Date Noted  . Screening examination for venereal disease 06/13/2015  . Pap smear for cervical cancer screening 03/15/2015  . ANA positive 12/14/2014   . Depo contraception 12/07/2014  . Pain in femur, right 02/12/2013  . Pain in femur, left 02/12/2013  . Vitamin D deficiency 01/27/2013  . Chronic pain syndrome 11/05/2012    Allergies  Allergen Reactions  . Quinine Derivatives Itching    Prior to Admission medications   Medication Sig Start Date End Date Taking? Authorizing Provider  acetaminophen-codeine (TYLENOL #3) 300-30 MG tablet Take 1 tablet by mouth every 6 (six) hours as needed for moderate pain. 12/27/15  Yes Tina Hoover, NP  azithromycin (ZITHROMAX) 250 MG tablet Take 2 on day one, then one a day for 4 days. 12/27/15  Yes Tina Hoover, NP  ibuprofen (ADVIL,MOTRIN) 800 MG tablet Take 1 tablet (800 mg total) by mouth every 6 (six) hours as needed. 02/20/16  Yes Tina Hoover, NP  Multiple Vitamins-Minerals (MULTIVITAMIN WITH MINERALS) tablet Take 1 tablet by mouth daily.   Yes Historical Provider, MD  potassium chloride (K-DUR) 10 MEQ tablet Take 2 tablets (20 mEq total) by mouth 2 (two) times daily. 01/15/12  Yes Tina Sessions, NP  Vitamin D, Ergocalciferol, (DRISDOL) 50000 UNITS CAPS capsule Take 1 capsule (50,000 Units total) by mouth every 7 (seven) days. 01/27/13  Yes Tina Harm, MD    Past Medical, Surgical Family and Social History reviewed and updated.    Objective:   Today's Vitals   05/24/16 1540  BP: 126/84  Pulse: Marland Kitchen)  103  Resp: 16  Temp: 98.7 F (37.1 C)  TempSrc: Oral  SpO2: 100%  Weight: 154 lb (69.9 kg)  Height: 5' (1.524 m)    Wt Readings from Last 3 Encounters:  05/24/16 154 lb (69.9 kg)  12/27/15 160 lb (72.6 kg)  12/15/15 157 lb (71.2 kg)    Physical Exam  Constitutional: She is oriented to person, place, and time. She appears well-developed and well-nourished.  HENT:  Head: Normocephalic and atraumatic.  Right Ear: External ear normal.  Left Ear: External ear normal.  Mouth/Throat: Oropharynx is clear and moist.  Eyes: Conjunctivae are normal. Pupils are  equal, round, and reactive to light.  Neck: Normal range of motion. Neck supple.  Cardiovascular: Normal rate, regular rhythm, normal heart sounds and intact distal pulses.   Pulmonary/Chest: Effort normal and breath sounds normal.  Neurological: She is alert and oriented to person, place, and time. She has normal strength. No sensory deficit. GCS eye subscore is 4. GCS verbal subscore is 5. GCS motor subscore is 6.  Skin: Skin is warm and dry.  Psychiatric: She has a normal mood and affect. Her behavior is normal. Judgment and thought content normal.    Assessment & Plan:  1. Sickle cell trait syndrome (HCC) 2. HIV, asymptomatic (HCC) -continue follow-up with Tina Huber 3. Neuropathy - Vitamin B12 - Vitamin D, 25-hydroxy - Sedimentation Rate Patient was previously referred to rheumatology. Will follow-up to see if a new referral is needed.  If B12 is normal and sed rate abnormal, will consider referring patient for further work-up by rheumatology.  4. Chronic pain syndrome - acetaminophen-codeine (TYLENOL #3) 300-30 MG tablet; Take 1 tablet by mouth every 6 (six) hours as needed for moderate pain.   -updated signed, pain contract obtained. According to the [place] Chronic Pain Initiative program, we have reviewed details related to analgesia, adverse effects, aberrant behaviors. Reviewed [place] Substance Reporting system prior to prescribing opiate medication, no inconsistencies noted.   RTC: 6 weeks  Tina Pick. Tiburcio Pea, MSN, [facility] [address] [place], [place] [address] [phone]

## 2016-05-25 LAB — VITAMIN B12: VITAMIN B 12: 730 pg/mL (ref 200–1100)

## 2016-05-25 LAB — SEDIMENTATION RATE: Sed Rate: 28 mm/hr — ABNORMAL HIGH (ref 0–20)

## 2016-05-25 LAB — VITAMIN D 25 HYDROXY (VIT D DEFICIENCY, FRACTURES): VIT D 25 HYDROXY: 33 ng/mL (ref 30–100)

## 2016-05-27 ENCOUNTER — Encounter: Payer: Self-pay | Admitting: Family Medicine

## 2016-05-28 ENCOUNTER — Other Ambulatory Visit: Payer: Managed Care, Other (non HMO)

## 2016-05-29 ENCOUNTER — Other Ambulatory Visit: Payer: Managed Care, Other (non HMO)

## 2016-05-29 DIAGNOSIS — Z21 Asymptomatic human immunodeficiency virus [HIV] infection status: Secondary | ICD-10-CM

## 2016-05-30 LAB — T-HELPER CELL (CD4) - (RCID CLINIC ONLY)
CD4 T CELL ABS: 1010 /uL (ref 400–2700)
CD4 T CELL HELPER: 40 % (ref 33–55)

## 2016-05-31 LAB — HIV-1 RNA QUANT-NO REFLEX-BLD
HIV 1 RNA Quant: 26 copies/mL — ABNORMAL HIGH
HIV-1 RNA Quant, Log: 1.41 Log copies/mL — ABNORMAL HIGH

## 2016-06-11 ENCOUNTER — Ambulatory Visit (INDEPENDENT_AMBULATORY_CARE_PROVIDER_SITE_OTHER): Payer: Managed Care, Other (non HMO) | Admitting: Internal Medicine

## 2016-06-11 ENCOUNTER — Encounter: Payer: Self-pay | Admitting: Internal Medicine

## 2016-06-11 VITALS — BP 111/78 | HR 89 | Temp 98.6°F | Ht 60.0 in | Wt 155.0 lb

## 2016-06-11 DIAGNOSIS — Z124 Encounter for screening for malignant neoplasm of cervix: Secondary | ICD-10-CM

## 2016-06-11 DIAGNOSIS — B20 Human immunodeficiency virus [HIV] disease: Secondary | ICD-10-CM

## 2016-06-11 NOTE — Progress Notes (Signed)
CC: Follow up for HIV  Interval history: Currently is asymptomatic and continued viral load now up to 26 without treatment.  She continues to be a long-term non progressor.  CD4 1,010.   Has no associated wieght loss, diarrhea. Also has sickle cell trait.    Prior to Admission medications   Medication Sig Start Date End Date Taking? Authorizing Provider  HYDROcodone-acetaminophen (NORCO) 5-325 MG tablet Take 1-2 tablets by mouth every 6 (six) hours as needed for severe pain. 02/20/15  Yes Pricilla Loveless, MD  ibuprofen (ADVIL,MOTRIN) 800 MG tablet Take 1 tablet (800 mg total) by mouth every 6 (six) hours as needed. 01/13/13  Yes Altha Harm, MD  Multiple Vitamins-Minerals (MULTIVITAMIN WITH MINERALS) tablet Take 1 tablet by mouth daily.   Yes Historical Provider, MD  traMADol (ULTRAM) 50 MG tablet Take 1 tablet (50 mg total) by mouth 2 (two) times daily. 03/15/15  Yes Quentin Angst, MD  Vitamin D, Ergocalciferol, (DRISDOL) 50000 UNITS CAPS capsule Take 1 capsule (50,000 Units total) by mouth every 7 (seven) days. 01/27/13  Yes Altha Harm, MD  medroxyPROGESTERone (DEPO-PROVERA) 150 MG/ML injection Inject 150 mg into the muscle every 3 (three) months. Reported on 06/13/2015    Historical Provider, MD  potassium chloride (K-DUR) 10 MEQ tablet Take 2 tablets (20 mEq total) by mouth 2 (two) times daily. Patient not taking: Reported on 12/07/2014 01/15/12   Grayce Sessions, NP  predniSONE (DELTASONE) 20 MG tablet Take 1 tablet (20 mg total) by mouth daily with breakfast. Patient not taking: Reported on 04/22/2015 03/15/15   Quentin Angst, MD    Review of Systems Constitutional: negative for fatigue and malaise Gastrointestinal: negative for diarrhea All other systems reviewed and are negative   Physical Exam: CONSTITUTIONAL:in no apparent distress and alert;  Eyes: anicteric HENT: no thrush, no cervical lymphadenopathy Respiratory: Normal respiratory effort; CTA  B  Lab Results  Component Value Date   HIV1RNAQUANT 26 (H) 05/29/2016   HIV1RNAQUANT <20 11/30/2015   HIV1RNAQUANT <20 05/31/2015   No components found for: HIV1GENOTYPRPLUS No components found for: THELPERCELL

## 2016-06-11 NOTE — Assessment & Plan Note (Signed)
She has this scheduled next month.

## 2016-06-11 NOTE — Assessment & Plan Note (Signed)
Continued long-term non progressor.  I discussed again the nature of this and no clear indications for treatment unless viral load starts to increase more (in to thousands).  Will continue to monitor every 6 months.

## 2016-06-14 ENCOUNTER — Ambulatory Visit: Payer: Managed Care, Other (non HMO) | Admitting: Family Medicine

## 2016-07-05 ENCOUNTER — Ambulatory Visit: Payer: Managed Care, Other (non HMO) | Admitting: Family Medicine

## 2016-07-10 ENCOUNTER — Ambulatory Visit (INDEPENDENT_AMBULATORY_CARE_PROVIDER_SITE_OTHER): Payer: Managed Care, Other (non HMO) | Admitting: Family Medicine

## 2016-07-10 ENCOUNTER — Encounter: Payer: Self-pay | Admitting: Family Medicine

## 2016-07-10 VITALS — BP 120/75 | HR 100 | Temp 98.0°F | Ht 60.0 in | Wt 155.0 lb

## 2016-07-10 DIAGNOSIS — B20 Human immunodeficiency virus [HIV] disease: Secondary | ICD-10-CM

## 2016-07-10 DIAGNOSIS — R768 Other specified abnormal immunological findings in serum: Secondary | ICD-10-CM

## 2016-07-10 DIAGNOSIS — Z8679 Personal history of other diseases of the circulatory system: Secondary | ICD-10-CM | POA: Diagnosis not present

## 2016-07-10 DIAGNOSIS — G894 Chronic pain syndrome: Secondary | ICD-10-CM

## 2016-07-10 LAB — POCT URINALYSIS DIP (DEVICE)
Bilirubin Urine: NEGATIVE
GLUCOSE, UA: NEGATIVE mg/dL
Hgb urine dipstick: NEGATIVE
KETONES UR: NEGATIVE mg/dL
Leukocytes, UA: NEGATIVE
Nitrite: NEGATIVE
PH: 8.5 — AB (ref 5.0–8.0)
PROTEIN: NEGATIVE mg/dL
Specific Gravity, Urine: 1.015 (ref 1.005–1.030)
UROBILINOGEN UA: 0.2 mg/dL (ref 0.0–1.0)

## 2016-07-10 MED ORDER — ACETAMINOPHEN-CODEINE #3 300-30 MG PO TABS
1.0000 | ORAL_TABLET | Freq: Four times a day (QID) | ORAL | 0 refills | Status: AC | PRN
Start: 2016-07-10 — End: 2016-08-09

## 2016-07-10 NOTE — Patient Instructions (Addendum)
I have increased your Tylenol 3 to 1-2 tablets as needed for pain.  Please schedule a follow-up with Rheumatology as soon as possible for further evaluation of your pain symptoms.    Chronic Pain, Adult Chronic pain is a type of pain that lasts or keeps coming back (recurs) for at least six months. You may have chronic headaches, abdominal pain, or body pain. Chronic pain may be related to an illness, such as fibromyalgia or complex regional pain syndrome. Sometimes the cause of chronic pain is not known. Chronic pain can make it hard for you to do daily activities. If not treated, chronic pain can lead to other health problems, including anxiety and depression. Treatment depends on the cause and severity of your pain. You may need to work with a pain specialist to come up with a treatment plan. The plan may include medicine, counseling, and physical therapy. Many people benefit from a combination of two or more types of treatment to control their pain. Follow these instructions at home: Lifestyle  Consider keeping a pain diary to share with your health care providers.  Consider talking with a mental health care provider (psychologist) about how to cope with chronic pain.  Consider joining a chronic pain support group.  Try to control or lower your stress levels. Talk to your health care provider about strategies to do this. General instructions   Take over-the-counter and prescription medicines only as told by your health care provider.  Follow your treatment plan as told by your health care provider. This may include: ? Gentle, regular exercise. ? Eating a healthy diet that includes foods such as vegetables, fruits, fish, and lean meats. ? Cognitive or behavioral therapy. ? Working with a Adult nurse. ? Meditation or yoga. ? Acupuncture or massage therapy. ? Aroma, color, light, or sound therapy. ? Local electrical stimulation. ? Shots (injections) of numbing or  pain-relieving medicines into the spine or the area of pain.  Check your pain level as told by your health care provider. Ask your health care provider if you should use a pain scale.  Learn as much as you can about how to manage your chronic pain. Ask your health care provider if an intensive pain rehabilitation program or a chronic pain specialist would be helpful.  Keep all follow-up visits as told by your health care provider. This is important. Contact a health care provider if:  Your pain gets worse.  You have new pain.  You have trouble sleeping.  You have trouble doing your normal activities.  Your pain is not controlled with treatment.  Your have side effects from pain medicine.  You feel weak. Get help right away if:  You lose feeling or have numbness in your body.  You lose control of bowel or bladder function.  Your pain suddenly gets much worse.  You develop shaking or chills.  You develop confusion.  You develop chest pain.  You have trouble breathing or shortness of breath.  You pass out.  You have thoughts about hurting yourself or others. This information is not intended to replace advice given to you by your health care provider. Make sure you discuss any questions you have with your health care provider. Document Released: 10/07/2001 Document Revised: 09/15/2015 Document Reviewed: 07/05/2015 Elsevier Interactive Patient Education  2017 [employer].

## 2016-07-10 NOTE — Progress Notes (Addendum)
Patient ID: Tina Huber, female    DOB: 1975/12/26, 41 y.o.   MRN: [id number]  PCP: Bing Neighbors, FNP  Chief Complaint  Patient presents with  . Follow-up    6 WEEKS/ Pain medication not working    Subjective:  HPI  Tina Huber is a 41 y.o. female presents for medication management. Medical problems include: Asymptomatic HIV, positive ANA, Chronic Pain Syndrome,  Patient reports that she is still experiencing numbness and tingling type pain in all extremities. She hasn't taken Tylenol 3 at bedtime as the medication makes her drowsy. She states that even with the Tylenol with codeine, she still experiences the pain. She feels the gabapentin is helpful in relieving the pain however that also makes her very drowsy during the day. Ms. Tina Huber was advised to follow-up with rheumatology and schedule a follow-up visit for rheumatology as they were working her up for further evaluation of a positive ANA. Last office visit she was found to have a low/normal vitamin D level and she reports that she is taking her D3 supplements regularly. Social History   Social History  . Marital status: Married    Spouse name: Tina Huber  . Number of children: Tina Huber  . Years of education: [place] Huber   Occupational History  . 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 Smoking status: Never Smoker  . Smokeless tobacco: Never Used  . Alcohol use No  . Drug use: No  . Sexual activity: No     Comment: No sex for 7 years    Other Topics Concern  . Not on file   Social History Narrative  . No narrative on file    Family History  Problem Relation Age of Onset  . Stroke Brother   . Diabetes Paternal Uncle    Review of Systems See HPI  Patient Active Problem List   Diagnosis Date Noted  . Screening examination for venereal disease 06/13/2015  . Pap smear for cervical cancer screening 03/15/2015  . Human immunodeficiency virus (HIV) disease (HCC) 12/14/2014  . ANA positive 12/14/2014  . Depo  contraception 12/07/2014  . Pain in femur, right 02/12/2013  . Pain in femur, left 02/12/2013  . Vitamin D deficiency 01/27/2013  . Chronic pain syndrome 11/05/2012    Allergies  Allergen Reactions  . Quinine Derivatives Itching    Prior to Admission medications   Medication Sig Start Date End Date Taking? Authorizing Provider  acetaminophen-codeine (TYLENOL #3) 300-30 MG tablet Take 1 tablet by mouth every 6 (six) hours as needed for moderate pain. 05/24/16  Yes Bing Neighbors, FNP  gabapentin (NEURONTIN) 300 MG capsule Take 1 capsule (300 mg total) by mouth 3 (three) times daily. 05/24/16  Yes Bing Neighbors, FNP  ibuprofen (ADVIL,MOTRIN) 800 MG tablet Take 1 tablet (800 mg total) by mouth every 6 (six) hours as needed. 02/20/16  Yes Henrietta Hoover, NP  Multiple Vitamins-Minerals (MULTIVITAMIN WITH MINERALS) tablet Take 1 tablet by mouth daily.   Yes [provider]  potassium chloride (K-DUR) 10 MEQ tablet Take 2 tablets (20 mEq total) by mouth 2 (two) times daily. 01/15/12  Yes Grayce Sessions, NP  Vitamin D, Ergocalciferol, (DRISDOL) 50000 UNITS CAPS capsule Take 1 capsule (50,000 Units total) by mouth every 7 (seven) days. 01/27/13  Yes Altha Harm, MD    Past Medical, Surgical Family and Social History reviewed and updated.    Objective:   Today's Vitals   07/10/16 0942  BP:  120/75  Pulse: 100  Temp: 98 F (36.7 C)  TempSrc: Oral  SpO2: 99%  Weight: 155 lb (70.3 kg)  Height: 5' (1.524 m)    Wt Readings from Last 3 Encounters:  07/10/16 155 lb (70.3 kg)  06/11/16 155 lb (70.3 kg)  05/24/16 154 lb (69.9 kg)   Physical Exam  Constitutional: She is oriented to person, place, and time. She appears well-developed and well-nourished.  HENT:  Head: Normocephalic and atraumatic.  Neck: Normal range of motion. Neck supple.  Cardiovascular: Normal rate, regular rhythm and intact distal pulses.   Murmur heard. Grade 1 auscultated    Pulmonary/Chest: Effort normal and breath sounds normal.  Musculoskeletal: Normal range of motion.  Neurological: She is alert and oriented to person, place, and time.  Skin: Skin is warm and dry.  Psychiatric: She has a normal mood and affect. Her behavior is normal. Judgment and thought content normal.   Assessment & Plan:  1. Chronic pain syndrome - acetaminophen-codeine (TYLENOL #3) 300-30 MG tablet; Take 1-2 tablets by mouth every 6 (six) hours as needed for moderate pain.  Dispense: 60 tablet; Take at bedtime only due to drowsiness. He may increase from 1 tablet to 2 tablets to achieve desired pain control. -Continue gabapentin. -Follow up and schedule an appointment with your rheumatologist to further evaluate the reduction of your chronic pain.  2. Hx of cardiac murmur, auscultated murmur on exam today. Patient reports she's had a murmur since childhood. - EKG 12-Lead, unremarkable, asymptomatic.  3. ANA positive -Follow up with rheumatology.  4. Human immunodeficiency virus (HIV) disease (HCC) -Keep scheduled appointments infectious disease  RTC:  Return for follow-up in 4 weeks for medication management.  Godfrey Pick. Tiburcio Pea, MSN, FNP-C [facility]  [address] Tina Hub[place], [place] [address] [phone]

## 2016-12-03 ENCOUNTER — Other Ambulatory Visit: Payer: Managed Care, Other (non HMO)

## 2016-12-03 DIAGNOSIS — B20 Human immunodeficiency virus [HIV] disease: Secondary | ICD-10-CM

## 2016-12-04 LAB — T-HELPER CELL (CD4) - (RCID CLINIC ONLY)
CD4 % Helper T Cell: 42 % (ref 33–55)
CD4 T CELL ABS: 1010 /uL (ref 400–2700)

## 2016-12-05 LAB — HIV-1 RNA QUANT-NO REFLEX-BLD
HIV 1 RNA Quant: <20 copies/mL — AB
HIV-1 RNA Quant, Log: <1.3 Log copies/mL — AB

## 2016-12-17 ENCOUNTER — Ambulatory Visit (INDEPENDENT_AMBULATORY_CARE_PROVIDER_SITE_OTHER): Payer: Managed Care, Other (non HMO) | Admitting: Internal Medicine

## 2016-12-17 ENCOUNTER — Encounter: Payer: Self-pay | Admitting: Internal Medicine

## 2016-12-17 VITALS — BP 121/85 | HR 90 | Temp 99.0°F | Ht 60.0 in | Wt 150.0 lb

## 2016-12-17 DIAGNOSIS — F526 Dyspareunia not due to a substance or known physiological condition: Secondary | ICD-10-CM

## 2016-12-17 DIAGNOSIS — B20 Human immunodeficiency virus [HIV] disease: Secondary | ICD-10-CM | POA: Diagnosis not present

## 2016-12-17 DIAGNOSIS — Z23 Encounter for immunization: Secondary | ICD-10-CM | POA: Diagnosis not present

## 2016-12-17 NOTE — Assessment & Plan Note (Signed)
Remains good off of medications.  I will continue to check every 6 months.

## 2016-12-17 NOTE — Assessment & Plan Note (Signed)
May be fibroids.  She will discuss with her pcp later this month.

## 2016-12-17 NOTE — Progress Notes (Signed)
CC: Follow up for HIV  Interval history: Currently is asymptomatic and continued viral load suppressed at < 20.  She continues to be a long-term non progressor.  CD4 1,010, same as last time.   Has no associated wieght loss, diarrhea. Some discomfort with sexual activity.  States it hurts with penetration internally, not vaginally.  Also has sickle cell trait.    Prior to Admission medications   Medication Sig Start Date End Date Taking? Authorizing Provider  HYDROcodone-acetaminophen (NORCO) 5-325 MG tablet Take 1-2 tablets by mouth every 6 (six) hours as needed for severe pain. 02/20/15  Yes Pricilla Loveless, MD  ibuprofen (ADVIL,MOTRIN) 800 MG tablet Take 1 tablet (800 mg total) by mouth every 6 (six) hours as needed. 01/13/13  Yes Altha Harm, MD  Multiple Vitamins-Minerals (MULTIVITAMIN WITH MINERALS) tablet Take 1 tablet by mouth daily.   Yes Historical Provider, MD  traMADol (ULTRAM) 50 MG tablet Take 1 tablet (50 mg total) by mouth 2 (two) times daily. 03/15/15  Yes Quentin Angst, MD  Vitamin D, Ergocalciferol, (DRISDOL) 50000 UNITS CAPS capsule Take 1 capsule (50,000 Units total) by mouth every 7 (seven) days. 01/27/13  Yes Altha Harm, MD  medroxyPROGESTERone (DEPO-PROVERA) 150 MG/ML injection Inject 150 mg into the muscle every 3 (three) months. Reported on 06/13/2015    Historical Provider, MD  potassium chloride (K-DUR) 10 MEQ tablet Take 2 tablets (20 mEq total) by mouth 2 (two) times daily. Patient not taking: Reported on 12/07/2014 01/15/12   Grayce Sessions, NP  predniSONE (DELTASONE) 20 MG tablet Take 1 tablet (20 mg total) by mouth daily with breakfast. Patient not taking: Reported on 04/22/2015 03/15/15   Quentin Angst, MD    Review of Systems Constitutional: negative for fatigue and malaise Gastrointestinal: negative for diarrhea  Physical Exam: CONSTITUTIONAL:in no apparent distress and alert;  Eyes: anicteric HENT: no thrush, no cervical  lymphadenopathy Respiratory: Normal respiratory effort; CTA B  Lab Results  Component Value Date   HIV1RNAQUANT <20 DETECTED (A) 12/03/2016   HIV1RNAQUANT 26 (H) 05/29/2016   HIV1RNAQUANT <20 11/30/2015   No components found for: HIV1GENOTYPRPLUS No components found for: THELPERCELL

## 2017-01-09 ENCOUNTER — Encounter: Payer: Self-pay | Admitting: Family Medicine

## 2017-01-09 ENCOUNTER — Ambulatory Visit: Payer: Managed Care, Other (non HMO) | Admitting: Family Medicine

## 2017-01-09 VITALS — BP 114/76 | HR 90 | Temp 98.8°F | Resp 14 | Ht 60.0 in | Wt 156.0 lb

## 2017-01-09 DIAGNOSIS — G894 Chronic pain syndrome: Secondary | ICD-10-CM

## 2017-01-09 DIAGNOSIS — E559 Vitamin D deficiency, unspecified: Secondary | ICD-10-CM | POA: Diagnosis not present

## 2017-01-09 DIAGNOSIS — D573 Sickle-cell trait: Secondary | ICD-10-CM

## 2017-01-09 LAB — POCT URINALYSIS DIP (DEVICE)
Bilirubin Urine: NEGATIVE
Glucose, UA: NEGATIVE mg/dL
HGB URINE DIPSTICK: NEGATIVE
Ketones, ur: NEGATIVE mg/dL
Leukocytes, UA: NEGATIVE
NITRITE: NEGATIVE
PH: 6.5 (ref 5.0–8.0)
Protein, ur: NEGATIVE mg/dL
Specific Gravity, Urine: 1.015 (ref 1.005–1.030)
Urobilinogen, UA: 0.2 mg/dL (ref 0.0–1.0)

## 2017-01-09 MED ORDER — VITAMIN D (ERGOCALCIFEROL) 1.25 MG (50000 UNIT) PO CAPS: 50000.0000 [IU] | ORAL_CAPSULE | ORAL | 3 refills | Status: DC

## 2017-01-09 MED ORDER — OXYCODONE-ACETAMINOPHEN 5-325 MG PO TABS: 1.0000 | ORAL_TABLET | ORAL | 0 refills | Status: DC | PRN

## 2017-01-09 NOTE — Progress Notes (Signed)
Patient ID: Tina Huber, female    DOB: 1975-04-08, 41 y.o.   MRN: [id number]  PCP: Tina Neighbors, FNP  Chief Complaint  Patient presents with  . Follow-up    6 month    Subjective:  HPI Tina Huber is a 41 y.o. female presents six month evaluation of chronic pain syndrome. Medical problems significant for HIV (undetectable), Vitamin D deficiency, sickle cell trait, and chronic pain syndrome. Tina Huber is followed by infectious disease and rheumatology. She presents today for chronic medication management.  Tina Huber suffers from chronic myalgias that occur intermittent exacerbations. Myalgias are controlled with antiinflammatories and as needed tylenol with codeine and or percocet.    She infrequently takes pain medication for symptoms. She feels that current pain may be exacerbated by cold weather. She is consistently taking diclofenac for inflammation which was prescribed by her rheumatologist which provides some relief of pain. Denies headaches, shortness of breath, chest pain, or dizziness. Social History   Socioeconomic History  . Marital status: Married    Spouse name: Not on file  . Number of children: Not on file  . Years of education: Not on file  . Highest education level: Not on file  Social Needs  . Financial resource strain: Not on file  . Food insecurity - worry: Not on file  . Food insecurity - inability: Not on file  . Transportation needs - medical: Not on file  . Transportation needs - non-medical: Not on file  Occupational History  . 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 Smoking status: Never Smoker  . Smokeless tobacco: Never Used  Substance and Sexual Activity  . Alcohol use: No    Alcohol/week: 0.0 oz  . Drug use: No  . Sexual activity: No    Partners: Male    Comment: No sex for 7 years   Other Topics Concern  . Not on file  Social History Narrative  . Not on file    Family History  Problem Relation Age of Onset  .  Stroke Brother   . Diabetes Paternal Uncle    Review of Systems Constitutional: Negative for fever, chills, diaphoresis, activity change, appetite change and fatigue. HENT: Negative for ear pain, nosebleeds, congestion, facial swelling, rhinorrhea, neck pain, neck stiffness and ear discharge.  Eyes: Negative for pain, discharge, redness, itching and visual disturbance. Respiratory: Negative for cough, choking, chest tightness, shortness of breath, wheezing and stridor.  Cardiovascular: Negative for chest pain, palpitations and leg swelling. Gastrointestinal: Negative for abdominal distention. Genitourinary: Negative for dysuria, urgency, frequency, hematuria, flank pain, decreased urine volume, difficulty urinating and dyspareunia.  Musculoskeletal: Negative for  joint swelling. Positive arthralgia. Negative for gait problem. Neurological: Negative for dizziness, tremors, seizures, syncope, facial asymmetry, speech difficulty, weakness, light-headedness, numbness and headaches.  Hematological: Negative for adenopathy. Does not bruise/bleed easily. Psychiatric/Behavioral: Negative for hallucinations, behavioral problems, confusion, dysphoric mood, decreased concentration and agitation.   Patient Active Problem List   Diagnosis Date Noted  . 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 Screening examination for venereal disease 06/13/2015  . Pap smear for cervical cancer screening 03/15/2015  . Human immunodeficiency virus (HIV) disease (HCC) 12/14/2014  . ANA positive 12/14/2014  . Depo contraception 12/07/2014  . Pain in femur, right 02/12/2013  . Pain in femur, left 02/12/2013  . Vitamin D deficiency 01/27/2013  . Chronic pain syndrome 11/05/2012    Allergies  Allergen Reactions  . Quinine Derivatives Itching    Prior to Admission  medications   Medication Sig Start Date End Date Taking? Authorizing Provider  diclofenac (VOLTAREN) 75 MG EC tablet Take 75 mg by mouth 2 (two) times daily.    Yes [provider]  gabapentin (NEURONTIN) 300 MG capsule Take 1 capsule (300 mg total) by mouth 3 (three) times daily. 05/24/16  Yes Tina Neighbors, FNP  ibuprofen (ADVIL,MOTRIN) 800 MG tablet Take 1 tablet (800 mg total) by mouth every 6 (six) hours as needed. 02/20/16  Yes Tina Hoover, NP  Multiple Vitamins-Minerals (MULTIVITAMIN WITH MINERALS) tablet Take 1 tablet by mouth daily.   Yes [provider]  potassium chloride (K-DUR) 10 MEQ tablet Take 2 tablets (20 mEq total) by mouth 2 (two) times daily. 01/15/12  Yes Tina Sessions, NP  Vitamin D, Ergocalciferol, (DRISDOL) 50000 UNITS CAPS capsule Take 1 capsule (50,000 Units total) by mouth every 7 (seven) days. 01/27/13  Yes Tina Harm, MD    Past Medical, Surgical Family and Social History reviewed and updated.    Objective:   Today's Vitals   01/09/17 1101  BP: 114/76  Pulse: 90  Resp: 14  Temp: 98.8 F (37.1 C)  TempSrc: Oral  SpO2: 100%  Weight: 156 lb (70.8 kg)  Height: 5' (1.524 m)    Wt Readings from Last 3 Encounters:  01/09/17 156 lb (70.8 kg)  12/17/16 150 lb (68 kg)  07/10/16 155 lb (70.3 kg)    Physical Exam Physical Exam: Constitutional: Patient appears well-developed and well-nourished. No distress. HENT: Normocephalic, atraumatic, External right and left ear normal. Oropharynx is clear and moist.  Eyes: Conjunctivae and EOM are normal. PERRLA, no scleral icterus. Neck: Normal ROM. Neck supple. No JVD. No tracheal deviation. No thyromegaly. CVS: RRR, S1/S2 +, no murmurs, no gallops, no carotid bruit.  Pulmonary: Effort and breath sounds normal, no stridor, rhonchi, wheezes, rales.  Abdominal: Soft. BS +, no distension, tenderness, rebound or guarding.  Musculoskeletal: Normal range of motion. No edema and no tenderness.  Lymphadenopathy: No lymphadenopathy noted, cervical, inguinal or axillary Neuro: Alert. Normal reflexes, muscle tone coordination. No cranial  nerve deficit. Skin: Skin is warm and dry. No rash noted. Not diaphoretic. No erythema. No pallor. Psychiatric: Normal mood and affect. Behavior, judgment, thought content normal.   Assessment & Plan:  1. Vitamin D deficiency, previously low -normal. Will check Vitamin D level today. Recommended chronic replacement due to chronic arthralgias.   2. Chronic pain syndrome Acute and chronic painful episodes - We agreed on Opiate dose and amount of pills  per month. We discussed that pt is to receive Schedule II prescriptions only from our clinic. Pt is also aware that the prescription history is available to [place] online through the PMP [place]. Controlled substance agreement reviewed and signed. We reminded  .Jaiyana Schreckengost that all patients receiving Schedule II narcotics must be seen for follow within one month of prescription being requested. We reviewed the terms of our pain agreement, including the need to keep medicines in a safe locked location away from children or pets, and the need to report excess sedation or constipation, measures to avoid constipation, and policies related to early refills and stolen prescriptions. According to the [place] [facility] program, we have reviewed details related to analgesia, adverse effects and aberrant behaviors.   -Refilling oxycodone-acetaminophen 5-325 mg every 4 hours as needed for pain.    3. Sickle cell trait syndrome (HCC), chronic ongoing. ? Related to chronic arthralgia.   Meds ordered this encounter  Medications  . Vitamin D, Ergocalciferol, (DRISDOL) 50000 units CAPS capsule    Sig: Take 1 capsule (50,000 Units total) by mouth every 7 (seven) days.    Dispense:  30 capsule    Refill:  3    Order Specific Question:   Supervising Provider    Answer:   Quentin Angst [id number]  . oxyCODONE-acetaminophen (PERCOCET) 5-325 MG tablet    Sig: Take 1 tablet by mouth every 4 (four) hours as needed for severe pain.    Dispense:  60 tablet     Refill:  0    Order Specific Question:   Supervising Provider    Answer:   Quentin Angst [id number]    Orders Placed This Encounter  Procedures  . VITAMIN D 25 Hydroxy (Vit-D Deficiency, Fractures)  . POCT urinalysis dip (device)   RTC: 6 months for chronic pain syndrome medication management.   Godfrey Pick. Tiburcio Pea, MSN, FNP-C [facility]  [address] [place]oon [place], [place] [address] [phone]

## 2017-01-10 LAB — VITAMIN D 25 HYDROXY (VIT D DEFICIENCY, FRACTURES): VIT D 25 HYDROXY: 37.2 ng/mL (ref 30.0–100.0)

## 2017-01-14 ENCOUNTER — Encounter: Payer: Self-pay | Admitting: Family Medicine

## 2017-02-19 ENCOUNTER — Emergency Department (HOSPITAL_BASED_OUTPATIENT_CLINIC_OR_DEPARTMENT_OTHER): Payer: Managed Care, Other (non HMO)

## 2017-02-19 ENCOUNTER — Other Ambulatory Visit: Payer: Self-pay

## 2017-02-19 ENCOUNTER — Encounter (HOSPITAL_BASED_OUTPATIENT_CLINIC_OR_DEPARTMENT_OTHER): Payer: Self-pay | Admitting: *Deleted

## 2017-02-19 ENCOUNTER — Emergency Department (HOSPITAL_BASED_OUTPATIENT_CLINIC_OR_DEPARTMENT_OTHER)
Admission: EM | Admit: 2017-02-19 | Discharge: 2017-02-20 | Disposition: A | Discharge status: Home / Self Care | Payer: Managed Care, Other (non HMO) | Attending: Emergency Medicine | Admitting: Emergency Medicine

## 2017-02-19 DIAGNOSIS — B20 Human immunodeficiency virus [HIV] disease: Secondary | ICD-10-CM | POA: Insufficient documentation

## 2017-02-19 DIAGNOSIS — Z79899 Other long term (current) drug therapy: Secondary | ICD-10-CM | POA: Insufficient documentation

## 2017-02-19 DIAGNOSIS — J181 Lobar pneumonia, unspecified organism: Secondary | ICD-10-CM | POA: Insufficient documentation

## 2017-02-19 DIAGNOSIS — R05 Cough: Secondary | ICD-10-CM | POA: Diagnosis present

## 2017-02-19 DIAGNOSIS — J209 Acute bronchitis, unspecified: Secondary | ICD-10-CM

## 2017-02-19 DIAGNOSIS — J189 Pneumonia, unspecified organism: Secondary | ICD-10-CM

## 2017-02-19 HISTORY — DX: Asymptomatic human immunodeficiency virus (hiv) infection status: Z21

## 2017-02-19 HISTORY — DX: Rheumatoid arthritis, unspecified: M06.9

## 2017-02-19 HISTORY — DX: Human immunodeficiency virus (HIV) disease: B20

## 2017-02-19 LAB — CBC WITH DIFFERENTIAL/PLATELET
BASOS ABS: 0 10*3/uL (ref 0.0–0.1)
BASOS PCT: 0 %
EOS PCT: 0 %
Eosinophils Absolute: 0 10*3/uL (ref 0.0–0.7)
HEMATOCRIT: 36.8 % (ref 36.0–46.0)
Hemoglobin: 13.2 g/dL (ref 12.0–15.0)
LYMPHS PCT: 43 %
Lymphs Abs: 2 10*3/uL (ref 0.7–4.0)
MCH: 30.6 pg (ref 26.0–34.0)
MCHC: 35.9 g/dL (ref 30.0–36.0)
MCV: 85.2 fL (ref 78.0–100.0)
Monocytes Absolute: 0.7 10*3/uL (ref 0.1–1.0)
Monocytes Relative: 15 %
NEUTROS ABS: 2 10*3/uL (ref 1.7–7.7)
Neutrophils Relative %: 42 %
PLATELETS: 220 10*3/uL (ref 150–400)
RBC: 4.32 MIL/uL (ref 3.87–5.11)
RDW: 13.4 % (ref 11.5–15.5)
WBC: 4.7 10*3/uL (ref 4.0–10.5)

## 2017-02-19 LAB — BASIC METABOLIC PANEL
ANION GAP: 11 (ref 5–15)
BUN: 10 mg/dL (ref 6–20)
CALCIUM: 8.9 mg/dL (ref 8.9–10.3)
CO2: 25 mmol/L (ref 22–32)
Chloride: 99 mmol/L — ABNORMAL LOW (ref 101–111)
Creatinine, Ser: 0.76 mg/dL (ref 0.44–1.00)
GFR calc Af Amer: >60 mL/min (ref >60)
GLUCOSE: 93 mg/dL (ref 65–99)
Potassium: 3.5 mmol/L (ref 3.5–5.1)
Sodium: 135 mmol/L (ref 135–145)

## 2017-02-19 MED ORDER — IPRATROPIUM-ALBUTEROL 0.5-2.5 (3) MG/3ML IN SOLN
3.0000 mL | RESPIRATORY_TRACT | Status: DC
  Administered 2017-02-19: 3 mL via RESPIRATORY_TRACT
  Filled 2017-02-19: qty 3

## 2017-02-19 MED ORDER — ONDANSETRON HCL 4 MG/2ML IJ SOLN
4.0000 mg | Freq: Once | INTRAMUSCULAR | Status: AC
  Administered 2017-02-19: 4 mg via INTRAVENOUS
  Filled 2017-02-19: qty 2

## 2017-02-19 MED ORDER — DEXTROSE 5 % IV SOLN
1.0000 g | Freq: Once | INTRAVENOUS | Status: AC
  Administered 2017-02-19: 1 g via INTRAVENOUS
  Filled 2017-02-19: qty 10

## 2017-02-19 MED ORDER — KETOROLAC TROMETHAMINE 15 MG/ML IJ SOLN
15.0000 mg | Freq: Once | INTRAMUSCULAR | Status: AC
  Administered 2017-02-19: 15 mg via INTRAVENOUS
  Filled 2017-02-19: qty 1

## 2017-02-19 MED ORDER — SODIUM CHLORIDE 0.9 % IV BOLUS (SEPSIS)
1000.0000 mL | Freq: Once | INTRAVENOUS | Status: AC
  Administered 2017-02-19: 1000 mL via INTRAVENOUS

## 2017-02-19 MED ORDER — ACETAMINOPHEN 500 MG PO TABS
1000.0000 mg | ORAL_TABLET | Freq: Once | ORAL | Status: AC
  Administered 2017-02-19: 1000 mg via ORAL
  Filled 2017-02-19: qty 2

## 2017-02-19 NOTE — ED Provider Notes (Signed)
[facility] Provider Note: Tina Dell, MD, FACEP  CSN: [id number] MRN: [id number] ARRIVAL: 02/19/17 at 2036 ROOM: MH02/MH02   CHIEF COMPLAINT  Cough   HISTORY OF PRESENT ILLNESS  02/19/17 11:03 PM Tina Huber is a 42 y.o. female with history of sickle cell disease, rheumatoid arthritis and HIV.  She is here with a 1 day history of flulike symptoms.  Specifically she has had fever, cough, nasal congestion, scratchy throat, lightheadedness, headache, right arm pain and nausea.  She has not had vomiting or diarrhea.  She is having pain in her chest when she coughs, particularly in the left lower chest.  She rates her headache as an 8 out of 10.  She has taken Tylenol but no other medications; she prefers to avoid narcotics when she can.  Her last dose of Tylenol was about 3 hours prior to arrival.  Consultation with the [facility] state controlled substances database reveals the patient has received 1 prescription for Percocet and 2 prescriptions for Tylenol 3 in the past year.   Past Medical History:  Diagnosis Date  . HIV (human immunodeficiency virus infection) (HCC)   . Rheumatoid arthritis (HCC)   . Sickle cell anemia (HCC)     History reviewed. No pertinent surgical history.  Family History  Problem Relation Age of Onset  . Stroke Brother   . Diabetes Paternal Uncle     Social History   Tobacco Use  . Smoking status: Never Smoker  . Smokeless tobacco: Never Used  Substance Use Topics  . Alcohol use: No    Alcohol/week: 0.0 oz  . Drug use: No    Prior to Admission medications   Medication Sig Start Date End Date Taking? Authorizing Provider  diclofenac (VOLTAREN) 75 MG EC tablet Take 75 mg by mouth 2 (two) times daily.    [provider]  gabapentin (NEURONTIN) 300 MG capsule Take 1 capsule (300 mg total) by mouth 3 (three) times daily. 05/24/16   Bing Neighbors, FNP  ibuprofen (ADVIL,MOTRIN) 800 MG tablet Take 1 tablet (800 mg  total) by mouth every 6 (six) hours as needed. 02/20/16   Henrietta Hoover, NP  Multiple Vitamins-Minerals (MULTIVITAMIN WITH MINERALS) tablet Take 1 tablet by mouth daily.    [provider]  oxyCODONE-acetaminophen (PERCOCET) 5-325 MG tablet Take 1 tablet by mouth every 4 (four) hours as needed for severe pain. 01/09/17   Bing Neighbors, FNP  potassium chloride (K-DUR) 10 MEQ tablet Take 2 tablets (20 mEq total) by mouth 2 (two) times daily. 01/15/12   Grayce Sessions, NP  Vitamin D, Ergocalciferol, (DRISDOL) 50000 units CAPS capsule Take 1 capsule (50,000 Units total) by mouth every 7 (seven) days. 01/09/17   Bing Neighbors, FNP    Allergies Quinine derivatives   REVIEW OF SYSTEMS  Negative except as noted here or in the History of Present Illness.   PHYSICAL EXAMINATION  Initial Vital Signs Blood pressure 133/85, pulse (!) 106, temperature 99 F (37.2 C), temperature source Oral, resp. rate (!) 24, height 5' (1.524 m), weight 68 kg (150 lb), last menstrual period 02/13/2017, SpO2 100 %.  Examination General: Well-developed, well-nourished female in no acute distress; appearance consistent with age of record HENT: normocephalic; atraumatic Eyes: pupils equal, round and reactive to light; extraocular muscles intact Neck: supple Heart: regular rate and rhythm Lungs: clear to auscultation bilaterally Abdomen: soft; nondistended; nontender; bowel sounds present Extremities: No deformity; full range of motion; pulses normal Neurologic: Awake, alert  and oriented; motor function intact in all extremities and symmetric; no facial droop Skin: Warm and dry Psychiatric: Flat affect   RESULTS  Summary of this visit's results, reviewed by myself:   EKG Interpretation  Date/Time:    Ventricular Rate:    PR Interval:    QRS Duration:   QT Interval:    QTC Calculation:   R Axis:     Text Interpretation:        Laboratory Studies: Results for orders placed  or performed during the hospital encounter of 02/19/17 (from the past 24 hour(s))  CBC with Differential/Platelet     Status: None   Collection Time: 02/19/17 11:21 PM  Result Value Ref Range   WBC 4.7 4.0 - 10.5 K/uL   RBC 4.32 3.87 - 5.11 MIL/uL   Hemoglobin 13.2 12.0 - 15.0 g/dL   HCT 16.1 09.6 - 04.5 %   MCV 85.2 78.0 - 100.0 fL   MCH 30.6 26.0 - 34.0 pg   MCHC 35.9 30.0 - 36.0 g/dL   RDW 40.9 81.1 - 91.4 %   Platelets 220 150 - 400 K/uL   Neutrophils Relative % 42 %   Neutro Abs 2.0 1.7 - 7.7 K/uL   Lymphocytes Relative 43 %   Lymphs Abs 2.0 0.7 - 4.0 K/uL   Monocytes Relative 15 %   Monocytes Absolute 0.7 0.1 - 1.0 K/uL   Eosinophils Relative 0 %   Eosinophils Absolute 0.0 0.0 - 0.7 K/uL   Basophils Relative 0 %   Basophils Absolute 0.0 0.0 - 0.1 K/uL  Basic metabolic panel     Status: Abnormal   Collection Time: 02/19/17 11:21 PM  Result Value Ref Range   Sodium 135 135 - 145 mmol/L   Potassium 3.5 3.5 - 5.1 mmol/L   Chloride 99 (L) 101 - 111 mmol/L   CO2 25 22 - 32 mmol/L   Glucose, Bld 93 65 - 99 mg/dL   BUN 10 6 - 20 mg/dL   Creatinine, Ser 7.82 0.44 - 1.00 mg/dL   Calcium 8.9 8.9 - 95.6 mg/dL   GFR calc non Af Amer >60 >60 mL/min   GFR calc Af Amer >60 >60 mL/min   Anion gap 11 5 - 15  hCG, quantitative, pregnancy     Status: None   Collection Time: 02/19/17 11:21 PM  Result Value Ref Range   hCG, Beta Chain, Quant, S <1 <5 mIU/mL   Imaging Studies: Dg Chest 2 View  Result Date: 02/19/2017 CLINICAL DATA:  Productive cough EXAM: CHEST  2 VIEW COMPARISON:  02/20/2015 FINDINGS: Streaky right lower lung opacity may reflect a mild infiltrate. No pleural effusion. Normal heart size. No pneumothorax. IMPRESSION: Ill-defined right lower lung streaky opacity may reflect atelectasis or minimal infiltrate Electronically Signed   By: Jasmine Pang M.D.   On: 02/19/2017 21:14    ED COURSE  Nursing notes and initial vitals signs, including pulse oximetry,  reviewed.  Vitals:   02/19/17 2042 02/19/17 2329 02/20/17 0104 02/20/17 0105  BP:   119/81   Pulse:   100   Resp:      Temp:    98.3 F (36.8 C)  TempSrc:    Oral  SpO2:  100% 100%   Weight: 68 kg (150 lb)     Height: 5' (1.524 m)      1:07 AM Patient feeling better after IV fluids, DuoNeb treatment and IV meds.  She was given 1 g of Rocephin IV for possible early  community-acquired pneumonia.  PROCEDURES    ED DIAGNOSES     ICD-10-CM   1. Acute bronchitis with bronchospasm J20.9   2. Community acquired pneumonia of right lower lobe of lung (HCC) J18.1        Lynanne Delgreco, MD 02/20/17 0110

## 2017-02-19 NOTE — ED Triage Notes (Signed)
Productive cough with brown/yellow sputum x several days. Reports fever of 101 yesterday. Reports cough causes chest tightness (only when coughing), HA and generalized body aches. No acute distress noted.

## 2017-02-20 LAB — HCG, QUANTITATIVE, PREGNANCY: hCG, Beta Chain, Quant, S: <1 m[IU]/mL (ref <5)

## 2017-02-20 MED ORDER — OXYCODONE-ACETAMINOPHEN 5-325 MG PO TABS: 1.0000 | ORAL_TABLET | ORAL | 0 refills | Status: DC | PRN

## 2017-02-20 MED ORDER — ALBUTEROL SULFATE HFA 108 (90 BASE) MCG/ACT IN AERS
2.0000 | INHALATION_SPRAY | RESPIRATORY_TRACT | Status: DC | PRN
  Administered 2017-02-20: 2 via RESPIRATORY_TRACT
  Filled 2017-02-20: qty 6.7

## 2017-02-20 MED ORDER — LEVOFLOXACIN 750 MG PO TABS: 750.0000 mg | ORAL_TABLET | Freq: Every day | ORAL | 0 refills | Status: DC

## 2017-02-20 MED ORDER — FENTANYL CITRATE (PF) 100 MCG/2ML IJ SOLN
100.0000 ug | Freq: Once | INTRAMUSCULAR | Status: AC
Start: 2017-02-20 — End: 2017-02-20
  Administered 2017-02-20: 100 ug via INTRAVENOUS
  Filled 2017-02-20: qty 2

## 2017-03-07 DIAGNOSIS — M069 Rheumatoid arthritis, unspecified: Secondary | ICD-10-CM | POA: Insufficient documentation

## 2017-03-07 DIAGNOSIS — B20 Human immunodeficiency virus [HIV] disease: Secondary | ICD-10-CM | POA: Insufficient documentation

## 2017-03-07 DIAGNOSIS — Z862 Personal history of diseases of the blood and blood-forming organs and certain disorders involving the immune mechanism: Secondary | ICD-10-CM | POA: Insufficient documentation

## 2017-03-07 DIAGNOSIS — N926 Irregular menstruation, unspecified: Secondary | ICD-10-CM | POA: Insufficient documentation

## 2017-05-22 ENCOUNTER — Telehealth: Payer: Self-pay

## 2017-05-23 ENCOUNTER — Other Ambulatory Visit: Payer: Self-pay | Admitting: Internal Medicine

## 2017-05-23 NOTE — Telephone Encounter (Signed)
Patient has not been seen for over 4 months. Needs reevaluation of pain, she does not have sickle cell disease.

## 2017-05-27 ENCOUNTER — Telehealth: Payer: Self-pay

## 2017-05-27 ENCOUNTER — Encounter: Payer: Self-pay | Admitting: Family Medicine

## 2017-05-27 ENCOUNTER — Other Ambulatory Visit: Payer: Self-pay

## 2017-05-27 ENCOUNTER — Other Ambulatory Visit: Payer: Self-pay | Admitting: Family Medicine

## 2017-05-27 DIAGNOSIS — Z79891 Long term (current) use of opiate analgesic: Secondary | ICD-10-CM

## 2017-05-27 MED ORDER — OXYCODONE-ACETAMINOPHEN 5-325 MG PO TABS: 1.0000 | ORAL_TABLET | Freq: Four times a day (QID) | ORAL | 0 refills | Status: DC | PRN

## 2017-05-27 MED ORDER — GABAPENTIN 300 MG PO CAPS
300.0000 mg | ORAL_CAPSULE | Freq: Three times a day (TID) | ORAL | 3 refills | Status: DC
Start: 2017-05-27 — End: 2018-01-10

## 2017-05-27 NOTE — Telephone Encounter (Signed)
Refill request for Percocet. Please advise. Thanks!

## 2017-05-27 NOTE — Progress Notes (Signed)
Reviewed [place] Substance Reporting system prior to prescribing opiate medications. No inconsistencies noted.  . Meds ordered this encounter  Medications  . oxyCODONE-acetaminophen (PERCOCET) 5-325 MG tablet    Sig: Take 1 tablet by mouth every 6 (six) hours as needed for severe pain.    Dispense:  30 tablet    Refill:  0    Order Specific Question:   Supervising Provider    Answer:   Quentin Angst [[id number]]    Nolon Nations  MSN, FNP-C Patient Care [facility] [address] [place], [place] [address] [phone]

## 2017-05-31 ENCOUNTER — Encounter: Payer: Self-pay | Admitting: Family Medicine

## 2017-05-31 ENCOUNTER — Ambulatory Visit (INDEPENDENT_AMBULATORY_CARE_PROVIDER_SITE_OTHER): Payer: Managed Care, Other (non HMO) | Admitting: Family Medicine

## 2017-05-31 VITALS — BP 119/84 | HR 79 | Temp 98.7°F | Resp 16 | Ht 60.0 in | Wt 157.0 lb

## 2017-05-31 DIAGNOSIS — G894 Chronic pain syndrome: Secondary | ICD-10-CM

## 2017-05-31 DIAGNOSIS — R5383 Other fatigue: Secondary | ICD-10-CM

## 2017-05-31 DIAGNOSIS — D573 Sickle-cell trait: Secondary | ICD-10-CM | POA: Diagnosis not present

## 2017-05-31 DIAGNOSIS — B20 Human immunodeficiency virus [HIV] disease: Secondary | ICD-10-CM

## 2017-05-31 DIAGNOSIS — M62838 Other muscle spasm: Secondary | ICD-10-CM | POA: Diagnosis not present

## 2017-05-31 DIAGNOSIS — E559 Vitamin D deficiency, unspecified: Secondary | ICD-10-CM

## 2017-05-31 MED ORDER — CYCLOBENZAPRINE HCL 10 MG PO TABS: 10.0000 mg | ORAL_TABLET | Freq: Every day | ORAL | 0 refills | Status: DC

## 2017-05-31 MED ORDER — TRAMADOL HCL 50 MG PO TABS: 50.0000 mg | ORAL_TABLET | Freq: Four times a day (QID) | ORAL | 0 refills | Status: DC | PRN

## 2017-05-31 NOTE — Patient Instructions (Signed)
I will discontinue Percocet for chronic pain syndrome.  We will start a trial of tramadol 50 mg every 6 hours as needed for moderate to severe pain.  We will also start a trial of cyclobenzaprine 10 mg at bedtime for nighttime muscle spasms.  Continue gabapentin 300 mg every 8 hours for chronic pain. After reviewing your hemoglobinopathy, I suspect that you may have an underlying blood disorder.  I will send a referral to hematology for further work-up and evaluation.

## 2017-05-31 NOTE — Progress Notes (Signed)
Subjective:    Patient ID: Tina Huber, female    DOB: [birth year], 42 y.o.   MRN: [id number]  HPI Tina Huber 42 year old female with a history of chronic pain syndrome, vitamin D deficiency, muscle spasms and sickle cell trait syndrome presents for follow-up of chronic conditions.  Patient states that she typically has pain on a daily basis.  Her current pain intensity is 8/10 characterized as aching and constant.  She also endorses periodic muscle spasms that mostly occur at night.  Patient states that pain medication was changed to Percocet 5-325 mg every 4 hours during previous visit.  She states that medication did not provide sustained relief.  She is taking gabapentin 300 mg 3 times a day and ibuprofen consistently for pain management with unsatisfactory relief.  Patient has a history of sickle cell trait syndrome.  When patient lived in [place], she was often treated for sickle cell crisis and received multiple transfusions as a child.  However, previous hemoglobin electrophoresis confirms sickle cell trait.  Patient also has a history of HIV.  She takes heart therapy consistently and is scheduled to follow-up with infectious disease in 1 month. She currently endorses fatigue.  She denies chest pains, shortness of breath, nausea, dysuria, vomiting, diarrhea, or constipation. Past Medical History:  Diagnosis Date  . HIV (human immunodeficiency virus infection) (HCC)   . Rheumatoid arthritis (HCC)   . Sickle cell anemia (HCC)    Social History   Socioeconomic History  . Marital status: Married    Spouse name: Not on file  . Number of children: Not on file  . Years of education: Not on file  . Highest education level: Not on file  Occupational History  . 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 Financial resource strain: Not on file  . Food insecurity:    Worry: Not on file    Inability: Not on file  . Transportation needs:    Medical: Not on file   Non-medical: Not on file  Tobacco Use  . Smoking status: Never Smoker  . Smokeless tobacco: Never Used  Substance and Sexual Activity  . Alcohol use: No    Alcohol/week: 0.0 oz  . Drug use: No  . Sexual activity: Never    Partners: Male    Comment: No sex for 7 years   Lifestyle  . Physical activity:    Days per week: Not on file    Minutes per session: Not on file  . Stress: Not on file  Relationships  . Social connections:    Talks on phone: Not on file    Gets together: Not on file    Attends religious service: Not on file    Active member of club or organization: Not on file    Attends meetings of clubs or organizations: Not on file    Relationship status: Not on file  . Intimate partner violence:    Fear of current or ex partner: Not on file    Emotionally abused: Not on file    Physically abused: Not on file    Forced sexual activity: Not on file  Other Topics Concern  . Not on file  Social History Narrative  . Not on file    Review of Systems  HENT: Negative.   Respiratory: Negative.   Cardiovascular: Negative.   Gastrointestinal: Negative.   Endocrine: Negative for polydipsia, polyphagia and polyuria.  Genitourinary: Negative.   Musculoskeletal: Positive for arthralgias.  Skin:  Negative.   Neurological: Positive for headaches.  Hematological: Negative.   Psychiatric/Behavioral: Negative.        Objective:   Physical Exam  HENT:  Head: Normocephalic.  Neck: Normal range of motion. Neck supple.  Cardiovascular: Normal rate, regular rhythm, normal heart sounds and intact distal pulses.  Pulmonary/Chest: Effort normal and breath sounds normal.  Abdominal: Soft. Bowel sounds are normal.  Skin: Skin is warm and dry.  Psychiatric: She has a normal mood and affect. Her behavior is normal. Judgment and thought content normal.      BP 119/84 (BP Location: Left Arm, Patient Position: Sitting, Cuff Size: Normal)   Pulse 79   Temp 98.7 F (37.1 C) (Oral)    Resp 16   Ht 5' (1.524 m)   Wt 157 lb (71.2 kg)   LMP 05/04/2017   SpO2 100%   BMI 30.66 kg/m  Assessment & Plan:   1. Human immunodeficiency virus (HIV) disease (HCC) Follow-up with infectious disease as scheduled.  2. Chronic pain syndrome We will discontinue Percocet and restart tramadol 50 mg every 6 hours as needed for moderate to severe pain. Reviewed [place] Substance Reporting system prior to prescribing opiate medications. No inconsistencies noted.   - traMADol (ULTRAM) 50 MG tablet; Take 1 tablet (50 mg total) by mouth every 6 (six) hours as needed.  Dispense: 30 tablet; Refill: 0  3. Night muscle spasms Will start a trial of cyclobenzaprine at bedtime for nighttime spasms. - cyclobenzaprine (FLEXERIL) 10 MG tablet; Take 1 tablet (10 mg total) by mouth at bedtime.  Dispense: 30 tablet; Refill: 0  4. Vitamin D deficiency  - Vitamin D, 25-hydroxy  5. Sickle cell trait syndrome (HCC) Review previous hemoglobin electrophoresis, consistent with sickle cell trait.  However patient has chronic pain and feels as though at times she is experiencing a pain crisis.  Patient states while living in [place] she was treated for pain crisis and received multiple transfusions.  We will send a referral to hematology for further work-up and evaluation  6. Other fatigue - TSH - CBC with Differential - Comprehensive metabolic panel  I suspect that patient has an underlying blood disorder. Requesting a hematology consult.    The patient was given clear instructions to go to ER or return to medical center if symptoms do not improve, worsen or new problems develop. The patient verbalized understanding.    Nolon Nations  MSN, FNP-C Patient Care [facility] [address] [place], [place] [address] [phone]

## 2017-06-01 LAB — COMPREHENSIVE METABOLIC PANEL
A/G RATIO: 1.3 (ref 1.2–2.2)
ALK PHOS: 63 IU/L (ref 39–117)
ALT: 30 IU/L (ref 0–32)
AST: 28 IU/L (ref 0–40)
Albumin: 4.2 g/dL (ref 3.5–5.5)
BILIRUBIN TOTAL: 0.3 mg/dL (ref 0.0–1.2)
BUN/Creatinine Ratio: 14 (ref 9–23)
BUN: 11 mg/dL (ref 6–24)
CHLORIDE: 99 mmol/L (ref 96–106)
CO2: 26 mmol/L (ref 20–29)
Calcium: 9.1 mg/dL (ref 8.7–10.2)
Creatinine, Ser: 0.77 mg/dL (ref 0.57–1.00)
GFR calc non Af Amer: 97 mL/min/{1.73_m2} (ref >59)
GFR, EST AFRICAN AMERICAN: 112 mL/min/{1.73_m2} (ref >59)
Globulin, Total: 3.2 g/dL (ref 1.5–4.5)
Glucose: 97 mg/dL (ref 65–99)
POTASSIUM: 4.3 mmol/L (ref 3.5–5.2)
Sodium: 137 mmol/L (ref 134–144)
TOTAL PROTEIN: 7.4 g/dL (ref 6.0–8.5)

## 2017-06-01 LAB — CBC WITH DIFFERENTIAL/PLATELET
BASOS: 0 %
Basophils Absolute: 0 10*3/uL (ref 0.0–0.2)
EOS (ABSOLUTE): 0.2 10*3/uL (ref 0.0–0.4)
Eos: 5 %
Hematocrit: 37.7 % (ref 34.0–46.6)
Hemoglobin: 12.7 g/dL (ref 11.1–15.9)
IMMATURE GRANS (ABS): 0 10*3/uL (ref 0.0–0.1)
Immature Granulocytes: 0 %
LYMPHS ABS: 1.4 10*3/uL (ref 0.7–3.1)
Lymphs: 36 %
MCH: 30.2 pg (ref 26.6–33.0)
MCHC: 33.7 g/dL (ref 31.5–35.7)
MCV: 90 fL (ref 79–97)
MONOS ABS: 0.6 10*3/uL (ref 0.1–0.9)
Monocytes: 15 %
NEUTROS ABS: 1.8 10*3/uL (ref 1.4–7.0)
Neutrophils: 44 %
PLATELETS: 222 10*3/uL (ref 150–379)
RBC: 4.2 x10E6/uL (ref 3.77–5.28)
RDW: 14.7 % (ref 12.3–15.4)
WBC: 3.9 10*3/uL (ref 3.4–10.8)

## 2017-06-01 LAB — VITAMIN D 25 HYDROXY (VIT D DEFICIENCY, FRACTURES): VIT D 25 HYDROXY: 146 ng/mL — AB (ref 30.0–100.0)

## 2017-06-01 LAB — TSH: TSH: 0.564 u[IU]/mL (ref 0.450–4.500)

## 2017-06-03 ENCOUNTER — Telehealth: Payer: Self-pay

## 2017-06-03 NOTE — Telephone Encounter (Signed)
Called and spoke with patient, advised that vitamin D level is elevated and we will discontinued weekly vitamin D. Recommended a daily multivitamin for women. Advised that all other labs were normal. Patient verbalized understanding. Thanks!

## 2017-06-03 NOTE — Telephone Encounter (Signed)
-----   Message from Massie Maroon, [place] sent at 06/01/2017  6:27 AM EDT ----- Regarding: lab results Please inform patient that vitamin D level is elevated, will discontinue weekly vitamin D. Recommend a daily multivitamin for women, no additional vitamin D warranted. All other labs are within a normal range.  Nolon Nations  MSN, FNP-C Patient Care [facility] [address] [place], [place] [address] [phone]

## 2017-06-04 ENCOUNTER — Other Ambulatory Visit: Payer: Managed Care, Other (non HMO)

## 2017-06-10 ENCOUNTER — Other Ambulatory Visit: Payer: Managed Care, Other (non HMO)

## 2017-06-10 DIAGNOSIS — B20 Human immunodeficiency virus [HIV] disease: Secondary | ICD-10-CM

## 2017-06-11 LAB — T-HELPER CELL (CD4) - (RCID CLINIC ONLY)
CD4 % Helper T Cell: 41 % (ref 33–55)
CD4 T Cell Abs: 840 /uL (ref 400–2700)

## 2017-06-12 LAB — HIV-1 RNA QUANT-NO REFLEX-BLD
HIV 1 RNA Quant: <20 copies/mL
HIV-1 RNA Quant, Log: <1.3 Log copies/mL

## 2017-06-18 ENCOUNTER — Encounter: Payer: Self-pay | Admitting: Internal Medicine

## 2017-06-18 ENCOUNTER — Ambulatory Visit: Payer: Managed Care, Other (non HMO) | Admitting: Internal Medicine

## 2017-06-18 VITALS — BP 135/91 | HR 112 | Temp 98.6°F | Wt 155.0 lb

## 2017-06-18 DIAGNOSIS — Z124 Encounter for screening for malignant neoplasm of cervix: Secondary | ICD-10-CM

## 2017-06-18 DIAGNOSIS — Z113 Encounter for screening for infections with a predominantly sexual mode of transmission: Secondary | ICD-10-CM

## 2017-06-18 DIAGNOSIS — B20 Human immunodeficiency virus [HIV] disease: Secondary | ICD-10-CM

## 2017-06-18 NOTE — Assessment & Plan Note (Signed)
She is getting regular pap smears from her PCP

## 2017-06-18 NOTE — Progress Notes (Signed)
   Subjective:    Patient ID: Tina Huber, female    DOB: 01/15/1976, 42 y.o.   MRN: [id number]  HPI Here for follow up of HIV. She is a long-term non-progressor with a CD4 of 840 and viral load < 20.  Highest has been 26 copies.  No new issues.     Review of Systems  Constitutional: Negative for fatigue and unexpected weight change.  Skin: Negative for rash.       Objective:   Physical Exam  Constitutional: She appears well-developed and well-nourished. No distress.  HENT:  Mouth/Throat: No oropharyngeal exudate.  Eyes: No scleral icterus.  Cardiovascular: Normal rate, regular rhythm and normal heart sounds.  No murmur heard. Pulmonary/Chest: Effort normal and breath sounds normal. No respiratory distress.          Assessment & Plan:

## 2017-06-18 NOTE — Assessment & Plan Note (Signed)
Doing well.  No new issues and will continue with every 6 month monitoring.  ARVs not absolutely indicated.

## 2017-07-10 ENCOUNTER — Encounter: Payer: Self-pay | Admitting: Family Medicine

## 2017-07-10 ENCOUNTER — Ambulatory Visit: Payer: Managed Care, Other (non HMO) | Admitting: Family Medicine

## 2017-07-10 VITALS — BP 118/74 | HR 92 | Temp 98.0°F | Ht 60.0 in | Wt 158.0 lb

## 2017-07-10 DIAGNOSIS — D573 Sickle-cell trait: Secondary | ICD-10-CM | POA: Diagnosis not present

## 2017-07-10 DIAGNOSIS — M62838 Other muscle spasm: Secondary | ICD-10-CM | POA: Diagnosis not present

## 2017-07-10 DIAGNOSIS — G8929 Other chronic pain: Secondary | ICD-10-CM

## 2017-07-10 DIAGNOSIS — M545 Low back pain, unspecified: Secondary | ICD-10-CM

## 2017-07-10 DIAGNOSIS — Z09 Encounter for follow-up examination after completed treatment for conditions other than malignant neoplasm: Secondary | ICD-10-CM

## 2017-07-10 DIAGNOSIS — G894 Chronic pain syndrome: Secondary | ICD-10-CM

## 2017-07-10 LAB — POCT URINALYSIS DIP (MANUAL ENTRY)
Bilirubin, UA: NEGATIVE
Blood, UA: NEGATIVE
Glucose, UA: NEGATIVE mg/dL
Ketones, POC UA: NEGATIVE mg/dL
Leukocytes, UA: NEGATIVE
Nitrite, UA: NEGATIVE
Protein Ur, POC: NEGATIVE mg/dL
Spec Grav, UA: 1.02 (ref 1.010–1.025)
Urobilinogen, UA: 0.2 E.U./dL
pH, UA: 7.5 (ref 5.0–8.0)

## 2017-07-10 MED ORDER — CYCLOBENZAPRINE HCL 10 MG PO TABS: 10.0000 mg | ORAL_TABLET | Freq: Every day | ORAL | 0 refills | Status: DC

## 2017-07-10 MED ORDER — TRAMADOL HCL 50 MG PO TABS: 50.0000 mg | ORAL_TABLET | Freq: Four times a day (QID) | ORAL | 0 refills | Status: DC | PRN

## 2017-07-10 NOTE — Progress Notes (Signed)
Subjective:    Patient ID: Tina Huber, female    DOB: Mar 13, 1975, 42 y.o.   MRN: [id number]  PCP: Tina Ip, NP  Chief Complaint  Patient presents with  . Follow-up    Sickle cell  . Bloated  . Back Pain    HPI  [name] has history of Sickle Cell Trait, RA, and HIV. She is here today for follow up.  Current Status: She is doing well today. She continues to have chronic lower back pain, but states that she is managing it well, using Flexeril, Tramadol, and Voltaren. She denies any recent signs or symptoms of infections.   She is having minor visual changes and will be scheduling appointment with Optometry. Denies dizziness, headaches, and falls. She has scheduled appointment with Rheumatologist.   She denies chest pain, cough, shortness of breath, and heart palpitations.   She denies any GI symptoms.   Denies anxiety and depression.   Past Medical History:  Diagnosis Date  . HIV (human immunodeficiency virus infection) (HCC)   . Rheumatoid arthritis (HCC)   . Sickle cell anemia (HCC)     Family History  Problem Relation Age of Onset  . Stroke Brother   . Diabetes Paternal Uncle     Social History   Socioeconomic History  . Marital status: Married    Spouse name: Not on file  . Number of children: Not on file  . Years of education: Not on file  . Highest education level: Not on file  Occupational History  . 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 Financial resource strain: Not on file  . Food insecurity:    Worry: Not on file    Inability: Not on file  . Transportation needs:    Medical: Not on file    Non-medical: Not on file  Tobacco Use  . Smoking status: Never Smoker  . Smokeless tobacco: Never Used  Substance and Sexual Activity  . Alcohol use: No    Alcohol/week: 0.0 oz  . Drug use: No  . Sexual activity: Never    Partners: Male    Comment: No sex for 7 years   Lifestyle  . Physical activity:    Days per week:  Not on file    Minutes per session: Not on file  . Stress: Not on file  Relationships  . Social connections:    Talks on phone: Not on file    Gets together: Not on file    Attends religious service: Not on file    Active member of club or organization: Not on file    Attends meetings of clubs or organizations: Not on file    Relationship status: Not on file  . Intimate partner violence:    Fear of current or ex partner: Not on file    Emotionally abused: Not on file    Physically abused: Not on file    Forced sexual activity: Not on file  Other Topics Concern  . Not on file  Social History Narrative  . Not on file    History reviewed. No pertinent surgical history.   Immunization History  Administered Date(s) Administered  . Influenza, Seasonal, Injecte, Preservative Fre 10/30/2014  . Influenza,inj,Quad PF,6+ Mos 01/27/2013, 12/15/2015, 12/17/2016  . Pneumococcal Polysaccharide-23 06/13/2015  . Tdap 12/15/2015    Current Meds  Medication Sig  . cyclobenzaprine (FLEXERIL) 10 MG tablet Take 1 tablet (10 mg total) by mouth at bedtime.  . diclofenac (  VOLTAREN) 75 MG EC tablet Take 75 mg by mouth 2 (two) times daily.  Marland Kitchen gabapentin (NEURONTIN) 300 MG capsule Take 1 capsule (300 mg total) by mouth 3 (three) times daily.  Marland Kitchen ibuprofen (ADVIL,MOTRIN) 800 MG tablet Take 1 tablet (800 mg total) by mouth every 6 (six) hours as needed.  . Multiple Vitamins-Minerals (MULTIVITAMIN WITH MINERALS) tablet Take 1 tablet by mouth daily.  . traMADol (ULTRAM) 50 MG tablet Take 1 tablet (50 mg total) by mouth every 6 (six) hours as needed.  . [DISCONTINUED] cyclobenzaprine (FLEXERIL) 10 MG tablet Take 1 tablet (10 mg total) by mouth at bedtime.  . [DISCONTINUED] traMADol (ULTRAM) 50 MG tablet Take 1 tablet (50 mg total) by mouth every 6 (six) hours as needed.    Allergies  Allergen Reactions  . Quinine Derivatives Itching    BP 118/74 (BP Location: Left Arm, Patient Position: Sitting, Cuff  Size: Large)   Pulse 92   Temp 98 F (36.7 C) (Oral)   Ht 5' (1.524 m)   Wt 158 lb (71.7 kg)   LMP 07/04/2017   SpO2 100%   BMI 30.86 kg/m     Review of Systems  Constitutional: Negative.   HENT: Negative.   Eyes: Negative.   Respiratory: Positive for cough (occasional).   Cardiovascular: Negative.   Gastrointestinal: Positive for abdominal distention (obese).  Genitourinary: Negative.   Musculoskeletal: Negative.   Skin: Negative.   Allergic/Immunologic: Negative.   Neurological: Negative.   Hematological: Negative.   Psychiatric/Behavioral: Negative.    Objective:   Physical Exam  Constitutional: She is oriented to person, place, and time. She appears well-developed and well-nourished.  HENT:  Head: Normocephalic and atraumatic.  Right Ear: External ear normal.  Left Ear: External ear normal.  Nose: Nose normal.  Mouth/Throat: Oropharynx is clear and moist.  Eyes: Pupils are equal, round, and reactive to light. Conjunctivae and EOM are normal.  Neck: Normal range of motion. Neck supple.  Cardiovascular: Normal rate, regular rhythm, normal heart sounds and intact distal pulses.  Pulmonary/Chest: Effort normal and breath sounds normal.  Abdominal: Soft. Bowel sounds are normal.  Musculoskeletal:  Mild lower back pain   Neurological: She is alert and oriented to person, place, and time.  Skin: Skin is warm and dry.  Psychiatric: She has a normal mood and affect. Her behavior is normal. Judgment and thought content normal.  Nursing note and vitals reviewed.  Assessment & Plan:   1. Sickle cell trait syndrome (HCC) Overall, she is doing well with no concerns today. She continues to stay well-hydrated daily and continues to be active. She continues to take pain medications as needed.   Urinalysis is normal today. We will refill Flexeril and Ultra today.  - POCT urinalysis dipstick  2. Chronic bilateral low back pain without sciatica - cyclobenzaprine (FLEXERIL)  10 MG tablet; Take 1 tablet (10 mg total) by mouth at bedtime.  Dispense: 30 tablet; Refill: 0 - traMADol (ULTRAM) 50 MG tablet; Take 1 tablet (50 mg total) by mouth every 6 (six) hours as needed.  Dispense: 30 tablet; Refill: 0  3. Night muscle spasms Stable. Continue Flexeril as directed.  - cyclobenzaprine (FLEXERIL) 10 MG tablet; Take 1 tablet (10 mg total) by mouth at bedtime.  Dispense: 30 tablet; Refill: 0  4. Chronic pain syndrome Stable. She is managing pain well. She will continue Tramadol, Voltaren, Ibuprofen, and Gabapentin as directed.   5. Follow up She will follow up in 6 months.   Meds ordered  this encounter  Medications  . cyclobenzaprine (FLEXERIL) 10 MG tablet    Sig: Take 1 tablet (10 mg total) by mouth at bedtime.    Dispense:  30 tablet    Refill:  0  . traMADol (ULTRAM) 50 MG tablet    Sig: Take 1 tablet (50 mg total) by mouth every 6 (six) hours as needed.    Dispense:  30 tablet    Refill:  0    Order Specific Question:   Supervising Provider    Answer:   Quentin Angst [[id number]]    Tina Ip,  MSN, FNP-BC Patient Care [facility] [address] [place], [place] [address] [phone]

## 2017-07-12 ENCOUNTER — Other Ambulatory Visit: Payer: Self-pay | Admitting: Family Medicine

## 2017-07-12 DIAGNOSIS — R7611 Nonspecific reaction to tuberculin skin test without active tuberculosis: Secondary | ICD-10-CM

## 2017-07-12 NOTE — Progress Notes (Signed)
Patient has a history of a positive TB screening. CXR ordered today to r/t TB.

## 2017-07-30 ENCOUNTER — Encounter: Payer: Self-pay | Admitting: Family Medicine

## 2017-08-12 ENCOUNTER — Other Ambulatory Visit: Payer: Self-pay

## 2017-08-12 DIAGNOSIS — M545 Low back pain: Principal | ICD-10-CM

## 2017-08-12 DIAGNOSIS — G8929 Other chronic pain: Secondary | ICD-10-CM

## 2017-08-12 MED ORDER — TRAMADOL HCL 50 MG PO TABS: 50.0000 mg | ORAL_TABLET | Freq: Four times a day (QID) | ORAL | 0 refills | Status: DC | PRN

## 2017-08-17 ENCOUNTER — Other Ambulatory Visit: Payer: Self-pay | Admitting: Family Medicine

## 2017-08-17 DIAGNOSIS — M545 Low back pain: Secondary | ICD-10-CM

## 2017-08-17 DIAGNOSIS — G8929 Other chronic pain: Secondary | ICD-10-CM

## 2017-08-17 DIAGNOSIS — M62838 Other muscle spasm: Secondary | ICD-10-CM

## 2017-08-19 MED ORDER — CYCLOBENZAPRINE HCL 10 MG PO TABS: 10.0000 mg | ORAL_TABLET | Freq: Every day | ORAL | 0 refills | Status: DC

## 2017-08-19 MED ORDER — TRAMADOL HCL 50 MG PO TABS: 50.0000 mg | ORAL_TABLET | Freq: Four times a day (QID) | ORAL | 0 refills | Status: DC | PRN

## 2017-11-12 ENCOUNTER — Other Ambulatory Visit: Payer: Self-pay

## 2017-11-12 DIAGNOSIS — G8929 Other chronic pain: Secondary | ICD-10-CM

## 2017-11-12 DIAGNOSIS — M62838 Other muscle spasm: Secondary | ICD-10-CM

## 2017-11-12 DIAGNOSIS — M545 Low back pain: Secondary | ICD-10-CM

## 2017-12-05 ENCOUNTER — Other Ambulatory Visit (HOSPITAL_COMMUNITY)
Admission: RE | Admit: 2017-12-05 | Discharge: 2017-12-05 | Disposition: A | Discharge status: Home / Self Care | Payer: Managed Care, Other (non HMO) | Source: Ambulatory Visit | Attending: Internal Medicine | Admitting: Internal Medicine

## 2017-12-05 ENCOUNTER — Other Ambulatory Visit: Payer: Managed Care, Other (non HMO)

## 2017-12-05 DIAGNOSIS — B20 Human immunodeficiency virus [HIV] disease: Secondary | ICD-10-CM

## 2017-12-05 DIAGNOSIS — Z113 Encounter for screening for infections with a predominantly sexual mode of transmission: Secondary | ICD-10-CM

## 2017-12-06 LAB — T-HELPER CELL (CD4) - (RCID CLINIC ONLY)
CD4 % Helper T Cell: 32 % — ABNORMAL LOW (ref 33–55)
CD4 T Cell Abs: 700 /uL (ref 400–2700)

## 2017-12-06 LAB — URINE CYTOLOGY ANCILLARY ONLY
CHLAMYDIA, DNA PROBE: NEGATIVE
Neisseria Gonorrhea: NEGATIVE

## 2017-12-09 LAB — HIV-1 RNA QUANT-NO REFLEX-BLD
HIV 1 RNA Quant: 23 copies/mL — ABNORMAL HIGH
HIV-1 RNA Quant, Log: 1.36 Log copies/mL — ABNORMAL HIGH

## 2017-12-09 LAB — RPR: RPR: NONREACTIVE

## 2017-12-19 ENCOUNTER — Encounter: Payer: Self-pay | Admitting: Internal Medicine

## 2017-12-19 ENCOUNTER — Ambulatory Visit (INDEPENDENT_AMBULATORY_CARE_PROVIDER_SITE_OTHER): Payer: Managed Care, Other (non HMO) | Admitting: Internal Medicine

## 2017-12-19 VITALS — BP 134/87 | HR 87 | Temp 98.0°F | Ht 61.0 in | Wt 154.0 lb

## 2017-12-19 DIAGNOSIS — Z113 Encounter for screening for infections with a predominantly sexual mode of transmission: Secondary | ICD-10-CM | POA: Diagnosis not present

## 2017-12-19 DIAGNOSIS — B20 Human immunodeficiency virus [HIV] disease: Secondary | ICD-10-CM

## 2017-12-19 DIAGNOSIS — Z23 Encounter for immunization: Secondary | ICD-10-CM | POA: Diagnosis not present

## 2017-12-19 NOTE — Assessment & Plan Note (Signed)
Screened negative 

## 2017-12-19 NOTE — Progress Notes (Signed)
   Subjective:    Patient ID: Tina Huber, female    DOB: 03-23-1975, 42 y.o.   MRN: [id number]  HPI Here for follow up of HIV. She is a long-term non-progressor with a CD4 of 700 and viral load 23 copies.  Highest has been 26 copies.  No new issues.  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  No associated weight loss or diarrhea.     Review of Systems  Constitutional: Negative for fatigue and unexpected weight change.  Skin: Negative for rash.       Objective:   Physical Exam  Constitutional: She appears well-developed and well-nourished. No distress.  HENT:  Mouth/Throat: No oropharyngeal exudate.  Eyes: No scleral icterus.  Cardiovascular: Normal rate, regular rhythm and normal heart sounds.  No murmur heard. Pulmonary/Chest: Effort normal and breath sounds normal. No respiratory distress.   SH: no tobacco       Assessment & Plan:

## 2017-12-19 NOTE — Assessment & Plan Note (Signed)
Doing well off of therapy with nearly undetectable viral load.  No absolute indication for treatment and patient prefers to opt out. rtc 6 months.

## 2017-12-19 NOTE — Assessment & Plan Note (Signed)
Discussed prevnar and given today Also received flu shot.

## 2018-01-10 ENCOUNTER — Encounter: Payer: Self-pay | Admitting: Family Medicine

## 2018-01-10 ENCOUNTER — Ambulatory Visit (INDEPENDENT_AMBULATORY_CARE_PROVIDER_SITE_OTHER): Payer: Managed Care, Other (non HMO) | Admitting: Family Medicine

## 2018-01-10 VITALS — BP 112/76 | HR 84 | Temp 97.9°F | Ht 61.0 in | Wt 155.0 lb

## 2018-01-10 DIAGNOSIS — Z09 Encounter for follow-up examination after completed treatment for conditions other than malignant neoplasm: Secondary | ICD-10-CM

## 2018-01-10 DIAGNOSIS — M545 Low back pain: Secondary | ICD-10-CM | POA: Diagnosis not present

## 2018-01-10 DIAGNOSIS — M62838 Other muscle spasm: Secondary | ICD-10-CM

## 2018-01-10 DIAGNOSIS — B20 Human immunodeficiency virus [HIV] disease: Secondary | ICD-10-CM

## 2018-01-10 DIAGNOSIS — Z0000 Encounter for general adult medical examination without abnormal findings: Secondary | ICD-10-CM | POA: Diagnosis not present

## 2018-01-10 DIAGNOSIS — G8929 Other chronic pain: Secondary | ICD-10-CM

## 2018-01-10 DIAGNOSIS — D571 Sickle-cell disease without crisis: Secondary | ICD-10-CM | POA: Diagnosis not present

## 2018-01-10 DIAGNOSIS — E559 Vitamin D deficiency, unspecified: Secondary | ICD-10-CM

## 2018-01-10 LAB — POCT URINALYSIS DIP (MANUAL ENTRY)
Bilirubin, UA: NEGATIVE
Blood, UA: NEGATIVE
Glucose, UA: NEGATIVE mg/dL
Ketones, POC UA: NEGATIVE mg/dL
Leukocytes, UA: NEGATIVE
Nitrite, UA: NEGATIVE
Protein Ur, POC: NEGATIVE mg/dL
Spec Grav, UA: 1.01 (ref 1.010–1.025)
Urobilinogen, UA: 0.2 E.U./dL
pH, UA: 6 (ref 5.0–8.0)

## 2018-01-10 MED ORDER — GABAPENTIN 300 MG PO CAPS: 300.0000 mg | ORAL_CAPSULE | Freq: Three times a day (TID) | ORAL | 6 refills | Status: DC

## 2018-01-10 MED ORDER — TRAMADOL HCL 50 MG PO TABS: 50.0000 mg | ORAL_TABLET | Freq: Four times a day (QID) | ORAL | 1 refills | Status: DC | PRN

## 2018-01-10 MED ORDER — CYCLOBENZAPRINE HCL 10 MG PO TABS: 10.0000 mg | ORAL_TABLET | Freq: Every day | ORAL | 6 refills | Status: DC

## 2018-01-10 MED ORDER — DICLOFENAC SODIUM 75 MG PO TBEC: 75.0000 mg | DELAYED_RELEASE_TABLET | Freq: Two times a day (BID) | ORAL | 6 refills | Status: DC

## 2018-01-10 NOTE — Progress Notes (Signed)
Sickle Cell Anemia Follow Up  Subjective:    Patient ID: Tina Huber, female    DOB: Mar 13, 1975, 42 y.o.   MRN: [id number]   Chief Complaint  Patient presents with  . Follow-up    Chronic condition     HPI  Tina Huber is a 42 year old female with a past medical history of Sickle Cell Anemia, RA, and HIV. She is here today for follow up.   Current Status: Since her last office visit, she is doing well with no complaints. She states that she has pain in her legs. She rates her pain today at 6/10. She states that her leg pain increases at night. She has not has a hospital visit for Sickle Cell Crisis since 06/26/2013 where she was treated and discharged the same day. She is currently taking all medications as prescribed and staying well hydrated. She reports occasional dizziness and headaches.  She continues to follow up with Infection Disease for HIV.   She denies fevers, chills, fatigue, recent infections, weight loss, and night sweats. She has not had any visual changes, and falls. No chest pain, heart palpitations, cough and shortness of breath reported. No reports of GI problems such as nausea, vomiting, diarrhea, and constipation. She has no reports of blood in stools, dysuria and hematuria. No depression or anxiety reported.    Review of Systems  Constitutional: Negative.   HENT: Negative.   Eyes: Negative.   Respiratory: Negative.   Cardiovascular: Negative.   Gastrointestinal: Positive for nausea.  Endocrine: Negative.   Genitourinary: Negative.   Musculoskeletal: Negative.   Skin: Negative.   Allergic/Immunologic: Negative.   Neurological: Negative.   Hematological: Negative.   Psychiatric/Behavioral: Negative.    Objective:   Physical Exam Nursing note reviewed.  Constitutional:      Appearance: Normal appearance. She is normal weight.  HENT:     Right Ear: Tympanic membrane, ear canal and external ear normal.     Left Ear: Tympanic membrane, ear canal and external  ear normal.     Nose: Nose normal.     Mouth/Throat:     Mouth: Mucous membranes are moist.  Eyes:     Extraocular Movements: Extraocular movements intact.     Conjunctiva/sclera: Conjunctivae normal.     Pupils: Pupils are equal, round, and reactive to light.  Neck:     Musculoskeletal: Normal range of motion and neck supple.  Cardiovascular:     Rate and Rhythm: Normal rate and regular rhythm.     Pulses: Normal pulses.     Heart sounds: Normal heart sounds.  Pulmonary:     Effort: Pulmonary effort is normal.     Breath sounds: Normal breath sounds.  Abdominal:     General: Bowel sounds are normal.     Palpations: Abdomen is soft.  Neurological:     Mental Status: She is alert.    Assessment & Plan:   1. Hb-SS disease without crisis [facility]) She is doing well today. She will continue to take pain medications as prescribed; will continue to avoid extreme heat and cold; will continue to eat a healthy diet and drink at least 64 ounces of water daily; continue stool softener as needed; will avoid colds and flu; will continue to get plenty of sleep and rest; will continue to avoid high stressful situations and remain infection free; will continue Folic Acid 1 mg daily to avoid sickle cell crisis.   2. Night muscle spasm - cyclobenzaprine (FLEXERIL) 10 MG tablet; Take  1 tablet (10 mg total) by mouth at bedtime.  Dispense: 30 tablet; Refill: 6 - traMADol (ULTRAM) 50 MG tablet; Take 1 tablet (50 mg total) by mouth every 6 (six) hours as needed.  Dispense: 30 tablet; Refill: 1 - diclofenac (VOLTAREN) 75 MG EC tablet; Take 1 tablet (75 mg total) by mouth 2 (two) times daily.  Dispense: 60 tablet; Refill: 6  3. Chronic bilateral low back pain without sciatica - cyclobenzaprine (FLEXERIL) 10 MG tablet; Take 1 tablet (10 mg total) by mouth at bedtime.  Dispense: 30 tablet; Refill: 6 - traMADol (ULTRAM) 50 MG tablet; Take 1 tablet (50 mg total) by mouth every 6 (six) hours as needed.  Dispense:  30 tablet; Refill: 1  4. Healthcare maintenance - Vitamin D, 25-hydroxy - Vitamin B12  5. Human immunodeficiency virus (HIV) disease (HCC) Stable. Follow up with Infection Disease.  6. Vitamin D deficiency Continue Vitamin Supplement as prescribed.   7. Follow up She will follow up in 4 months.  - POCT urinalysis dipstick  Meds ordered this encounter  Medications  . cyclobenzaprine (FLEXERIL) 10 MG tablet    Sig: Take 1 tablet (10 mg total) by mouth at bedtime.    Dispense:  30 tablet    Refill:  6  . traMADol (ULTRAM) 50 MG tablet    Sig: Take 1 tablet (50 mg total) by mouth every 6 (six) hours as needed.    Dispense:  30 tablet    Refill:  1  . gabapentin (NEURONTIN) 300 MG capsule    Sig: Take 1 capsule (300 mg total) by mouth 3 (three) times daily.    Dispense:  90 capsule    Refill:  6  . diclofenac (VOLTAREN) 75 MG EC tablet    Sig: Take 1 tablet (75 mg total) by mouth 2 (two) times daily.    Dispense:  60 tablet    Refill:  6   Raliegh Ip,  MSN, [facility] Patient [facility] [address] [place], [place] [address] [phone]

## 2018-01-10 NOTE — Progress Notes (Signed)
Stro

## 2018-01-11 LAB — VITAMIN D 25 HYDROXY (VIT D DEFICIENCY, FRACTURES): Vit D, 25-Hydroxy: 88.8 ng/mL (ref 30.0–100.0)

## 2018-01-11 LAB — VITAMIN B12: Vitamin B-12: 1109 pg/mL (ref 232–1245)

## 2018-01-26 ENCOUNTER — Ambulatory Visit (HOSPITAL_COMMUNITY)
Admission: EM | Admit: 2018-01-26 | Discharge: 2018-01-26 | Disposition: A | Discharge status: Home / Self Care | Payer: Managed Care, Other (non HMO) | Attending: Family Medicine | Admitting: Family Medicine

## 2018-01-26 ENCOUNTER — Other Ambulatory Visit: Payer: Self-pay

## 2018-01-26 ENCOUNTER — Encounter (HOSPITAL_COMMUNITY): Payer: Self-pay | Admitting: *Deleted

## 2018-01-26 DIAGNOSIS — R079 Chest pain, unspecified: Secondary | ICD-10-CM

## 2018-01-26 DIAGNOSIS — M25561 Pain in right knee: Secondary | ICD-10-CM | POA: Diagnosis not present

## 2018-01-26 DIAGNOSIS — D5700 Hb-SS disease with crisis, unspecified: Secondary | ICD-10-CM | POA: Diagnosis not present

## 2018-01-26 MED ORDER — KETOROLAC TROMETHAMINE 30 MG/ML IJ SOLN
30.0000 mg | Freq: Once | INTRAMUSCULAR | Status: AC
  Administered 2018-01-26: 30 mg via INTRAMUSCULAR

## 2018-01-26 MED ORDER — TRAMADOL-ACETAMINOPHEN 37.5-325 MG PO TABS: 1.0000 | ORAL_TABLET | Freq: Three times a day (TID) | ORAL | 0 refills | Status: AC | PRN

## 2018-01-26 MED ORDER — KETOROLAC TROMETHAMINE 30 MG/ML IJ SOLN
INTRAMUSCULAR | Status: AC
  Filled 2018-01-26: qty 1

## 2018-01-26 NOTE — Discharge Instructions (Addendum)
It was nice meeting you today. I am sorry you are in pain. Looks like you might be having sickle cell crisis. Please hold Tramadol and start Ultracet which is a combination of Tramadol and Acetaminophen. Use Ultracet for 5 days and then resume your home pain regimen. Currently, you don't have chest pain which is good. Please go straight to the ED if pain worsen, you are having persistent chest pain with shortness or breath or fever. Call your PCP's office tomorrow for follow-up.

## 2018-01-26 NOTE — ED Triage Notes (Addendum)
C/O pain to entire right leg x 2 days without injury.  C/O "feeling like it locks" when she stands up.  Denies any swelling.  C/O intermittent sharp right chest pains radiating through to back x 2 days; improves some with inhaler; c/o non-productive cough.  Denies any chest pain at present.

## 2018-01-26 NOTE — ED Notes (Signed)
EKG shown to L. [place]im[place], [place].

## 2018-01-26 NOTE — ED Provider Notes (Signed)
[facility]    CSN: [id number] Arrival date & time: 01/26/18  1544     History   Chief Complaint Chief Complaint  Patient presents with  . Leg Pain  . Chest Pain    HPI Tina Huber is a 42 y.o. female.   The history is provided by the patient. No language interpreter was used.  Leg Pain  Location:  Knee and leg Time since incident:  1 week Leg location:  R leg Pain details:    Quality:  Sharp, aching and throbbing   Radiates to:  Back   Severity:  Moderate (8/10)   Onset quality:  Gradual   Duration:  1 week   Timing:  Constant   Progression:  Worsening Chronicity:  Recurrent (Similar to her sickle cell crises) Dislocation: no   Prior injury to area:  No Ineffective treatments: Tramadol, Gabapentin. Associated symptoms: fever   Associated symptoms comment:  Weakness, feels dizzy. 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 Onset quality:  Gradual Duration:  1 week Timing:  Intermittent Progression:  Worsening Chronicity:  New Context comment:  SOB only with chest pain Relieved by: Inhaler. Exacerbated by: Talking too much makes her cough. Associated symptoms: dizziness and fever   Associated symptoms: no cough, no nausea, no near-syncope, no shortness of breath, no vomiting and no weakness     Past Medical History:  Diagnosis Date  . HIV (human immunodeficiency virus infection) (HCC)   . Rheumatoid arthritis (HCC)   . Sickle cell anemia [facility])     Patient Active Problem List   Diagnosis Date Noted  . Need for prophylactic vaccination against Streptococcus pneumoniae (pneumococcus) 12/19/2017  . 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 Screening examination for venereal disease 06/13/2015  . Pap smear for cervical cancer screening 03/15/2015  . Human immunodeficiency virus (HIV) disease (HCC) 12/14/2014  . ANA positive 12/14/2014  . Depo contraception 12/07/2014  . Pain in femur, right 02/12/2013  . Pain in femur, left 02/12/2013  . Vitamin D deficiency 01/27/2013  . Chronic pain syndrome 11/05/2012    Past Surgical History:  Procedure Laterality Date  . OSTEOCHONDROMA EXCISION      OB History   No obstetric history on file.      Home Medications    Prior to Admission medications   Medication Sig Start Date End Date Taking? Authorizing Provider  cyclobenzaprine (FLEXERIL) 10 MG tablet Take 1 tablet (10 mg total) by mouth at bedtime. 01/10/18  Yes Kallie Locks, FNP  diclofenac (VOLTAREN) 75 MG EC tablet Take 1 tablet (75 mg total) by mouth 2 (two) times daily. 01/10/18  Yes Kallie Locks, FNP  folic acid (FOLVITE) 1 MG tablet Take 1 mg by mouth daily.   Yes [provider]  gabapentin (NEURONTIN) 300 MG capsule Take 1 capsule (300 mg total) by mouth 3 (three) times daily. 01/10/18  Yes Kallie Locks, FNP  POTASSIUM PO Take by mouth.   Yes [provider]  traMADol (ULTRAM) 50 MG tablet Take 1 tablet (50 mg total) by mouth every 6 (six) hours as needed. 01/10/18  Yes Kallie Locks, FNP  Vitamin D, Ergocalciferol, (DRISDOL) 50000 units CAPS capsule Take 1 capsule (50,000 Units total) by mouth every 7 (seven) days. 01/09/17  Yes Bing Neighbors, FNP  ibuprofen (ADVIL,MOTRIN) 800 MG tablet Take 1 tablet (800 mg total) by mouth every 6 (six) hours as needed. 02/20/16   Henrietta Hoover, NP  Multiple Vitamins-Minerals (MULTIVITAMIN WITH MINERALS) tablet Take 1 tablet by mouth daily.    [provider]    Family History Family History  Problem Relation Age of Onset  . Stroke Brother   . Diabetes Paternal Uncle     Social History Social History   Tobacco Use    . Smoking status: Never Smoker  . Smokeless tobacco: Never Used  Substance Use Topics  . Alcohol use: No    Alcohol/week: 0.0 standard drinks  . Drug use: No     Allergies   Quinine derivatives   Review of Systems Review of Systems  Constitutional: Positive for fever.  Respiratory: Negative for cough and shortness of breath.   Cardiovascular: Positive for chest pain. Negative for near-syncope.  Gastrointestinal: Negative for nausea and vomiting.  Musculoskeletal: Positive for arthralgias.       Right LL pain  Neurological: Positive for dizziness. Negative for weakness.     Physical Exam Triage Vital Signs ED Triage Vitals  Enc Vitals Group     BP 01/26/18 1701 123/88     Pulse Rate 01/26/18 1701 97     Resp 01/26/18 1701 14     Temp 01/26/18 1701 98.2 F (36.8 C)     Temp Source 01/26/18 1701 Oral     SpO2 01/26/18 1701 100 %     Weight --      Height --      Head Circumference --      Peak Flow --      Pain Score 01/26/18 1702 8     Pain Loc --      Pain Edu? --      Excl. in GC? --    No data found.  Updated Vital Signs BP 123/88   Pulse 97   Temp 98.2 F (36.8 C) (Oral)   Resp 14   LMP 01/06/2018 (Exact Date)   SpO2 100%   Visual Acuity Right Eye Distance:   Left Eye Distance:   Bilateral Distance:    Right Eye Near:   Left Eye Near:    Bilateral Near:     Physical Exam Vitals signs and nursing note reviewed.  Constitutional:      General: She is not in acute distress.    Appearance: She is not ill-appearing, toxic-appearing or diaphoretic.  Neck:     Musculoskeletal: Normal range of motion and neck supple.  Cardiovascular:     Rate and Rhythm: Normal rate and regular rhythm.     Heart sounds: Normal heart sounds. Heart sounds not distant. No murmur. No systolic murmur. No diastolic murmur.  Pulmonary:     Effort: Pulmonary effort is normal.     Breath sounds: Normal breath sounds. No decreased breath sounds, wheezing, rhonchi or  rales.  Chest:    Musculoskeletal:     Comments: Right lower limb tender to palpation  Neurological:     Mental Status: She is alert.      UC Treatments / Results  Labs (all labs ordered are listed, but only abnormal results are displayed) Labs Reviewed - No data to display  EKG None  Radiology No results found.  Procedures Procedures (including  critical care time)  Medications Ordered in UC Medications - No data to display  Initial Impression / Assessment and Plan / UC Course  I have reviewed the triage vital signs and the nursing notes.  Pertinent labs & imaging results that were available during my care of the patient were reviewed by me and considered in my medical decision making (see chart for details).  Clinical Course as of Jan 26 1758  Wynelle Link Jan 26, 2018  1757 Sickle cell crisis. Having right LL bone pain which is typical of her SS crisis. Current home regimen for pain is ineffective. One dose Toradol given today. Hx of Opioid allergy per patient. Use Ultracet x 5 days and then resume home Tramadol regimen. ED precaution discussed. Call PCP tomorrow for f/u.   [KE]  1758 Chest pain. Currently asymptomatic. O2 Sat on RA normal. Pulm exam benign. Acute chest less likely. EKG shows sinus tachy. Red flag symptoms discussed. She should go to the ED if having SOB, persistent chest pain or fever. She agreed with the plan.   [KE]    Clinical Course User Index [KE] Doreene Eland, MD    Acute sickle cell crisis (HCC)  Chest pain, unspecified type  Arthralgia of right lower leg   Final Clinical Impressions(s) / UC Diagnoses   Final diagnoses:  None   Discharge Instructions   None    ED Prescriptions    None     Controlled Substance Prescriptions [place] Controlled Substance Registry consulted? I was unable to pull record for her, likely due to the way her name is compounded.   Doreene Eland, MD 01/26/18 [phone]

## 2018-04-15 ENCOUNTER — Telehealth: Payer: Self-pay

## 2018-04-15 ENCOUNTER — Other Ambulatory Visit: Payer: Self-pay | Admitting: Family Medicine

## 2018-04-15 DIAGNOSIS — M545 Low back pain: Principal | ICD-10-CM

## 2018-04-15 DIAGNOSIS — G8929 Other chronic pain: Secondary | ICD-10-CM

## 2018-04-15 DIAGNOSIS — M62838 Other muscle spasm: Secondary | ICD-10-CM

## 2018-04-15 MED ORDER — IBUPROFEN 800 MG PO TABS: 800.0000 mg | ORAL_TABLET | Freq: Four times a day (QID) | ORAL | 2 refills | Status: DC | PRN

## 2018-04-15 MED ORDER — CYCLOBENZAPRINE HCL 10 MG PO TABS: 10.0000 mg | ORAL_TABLET | Freq: Every day | ORAL | 3 refills | Status: DC

## 2018-04-16 ENCOUNTER — Encounter: Payer: Self-pay | Admitting: Family Medicine

## 2018-04-16 NOTE — Telephone Encounter (Signed)
Patient notified

## 2018-05-12 ENCOUNTER — Ambulatory Visit: Payer: Managed Care, Other (non HMO) | Admitting: Family Medicine

## 2018-05-13 ENCOUNTER — Other Ambulatory Visit: Payer: Self-pay

## 2018-05-13 ENCOUNTER — Encounter: Payer: Self-pay | Admitting: Family Medicine

## 2018-05-13 ENCOUNTER — Ambulatory Visit (INDEPENDENT_AMBULATORY_CARE_PROVIDER_SITE_OTHER): Payer: Managed Care, Other (non HMO) | Admitting: Family Medicine

## 2018-05-13 DIAGNOSIS — G894 Chronic pain syndrome: Secondary | ICD-10-CM

## 2018-05-13 DIAGNOSIS — M545 Low back pain, unspecified: Secondary | ICD-10-CM

## 2018-05-13 DIAGNOSIS — D571 Sickle-cell disease without crisis: Secondary | ICD-10-CM

## 2018-05-13 DIAGNOSIS — G8929 Other chronic pain: Secondary | ICD-10-CM | POA: Diagnosis not present

## 2018-05-13 MED ORDER — OXYCODONE-ACETAMINOPHEN 5-325 MG PO TABS: 1.0000 | ORAL_TABLET | Freq: Four times a day (QID) | ORAL | 0 refills | Status: DC | PRN

## 2018-05-13 MED ORDER — IBUPROFEN 800 MG PO TABS: 800.0000 mg | ORAL_TABLET | Freq: Four times a day (QID) | ORAL | 3 refills | Status: DC | PRN

## 2018-05-13 NOTE — Progress Notes (Signed)
Virtual Visit via Telephone Note  I connected with Tina Huber on 05/13/18 at  1:40 PM EDT by telephone and verified that I am speaking with the correct person using two identifiers.   I discussed the limitations, risks, security and privacy concerns of performing an evaluation and management service by telephone and the availability of in person appointments. I also discussed with the patient that there may be a patient responsible charge related to this service. The patient expressed understanding and agreed to proceed.   History of Present Illness:  Past Medical History:  Diagnosis Date  . Chronic pain syndrome   . HIV (human immunodeficiency virus infection) (HCC)   . Rheumatoid arthritis (HCC)   . Sickle cell anemia (HCC)     Current Outpatient Medications on File Prior to Visit  Medication Sig Dispense Refill  . cyclobenzaprine (FLEXERIL) 10 MG tablet Take 1 tablet (10 mg total) by mouth at bedtime. 30 tablet 3  . diclofenac (VOLTAREN) 75 MG EC tablet Take 1 tablet (75 mg total) by mouth 2 (two) times daily. 60 tablet 6  . folic acid (FOLVITE) 1 MG tablet Take 1 mg by mouth daily.    . Multiple Vitamins-Minerals (MULTIVITAMIN WITH MINERALS) tablet Take 1 tablet by mouth daily.    . Vitamin D, Ergocalciferol, (DRISDOL) 50000 units CAPS capsule Take 1 capsule (50,000 Units total) by mouth every 7 (seven) days. 30 capsule 3  . gabapentin (NEURONTIN) 300 MG capsule Take 1 capsule (300 mg total) by mouth 3 (three) times daily. 90 capsule 6  . POTASSIUM PO Take by mouth.     No current facility-administered medications on file prior to visit.     Current Status: Since her last office visit, she is doing well with no complaints. She states that she has pain in her right arm and leg. She rates her pain today at 6-7/10. She is currently taking Motrin and Acetaminophen to help with pain relief. She states that her pain is getting worse, as she is taking Motrin more often now. She has not  has a hospital visit for Sickle Cell Crisis since 01/26/2018 where she was treated and discharged the same day. She is currently taking all medications as prescribed and staying well hydrated. She reports occasional nausea, dizziness and headaches. She states that she has been following up with Infection Disease for HIV.   She denies fevers, chills, fatigue, recent infections, weight loss, and night sweats. She has not had any visual changes, and falls. No chest pain, heart palpitations, cough and shortness of breath reported. No reports of GI problems such as vomiting, diarrhea, and constipation. She has no reports of blood in stools, dysuria and hematuria. No depression or anxiety reported. She denies pain today.    Observations/Objective:  Telephone Virtual Visit   Assessment and Plan:  1. Hb-SS disease without crisis [facility]) We will initiate Percocet today to aid in pain management. She is doing well today. She will continue to take OTC pain medications as prescribed; will continue to avoid extreme heat and cold; will continue to eat a healthy diet and drink at least 64 ounces of water daily; continue stool softener as needed; will avoid colds and flu; will continue to get plenty of sleep and rest; will continue to avoid high stressful situations and remain infection free; will continue Folic Acid 1 mg daily to avoid sickle cell crisis.  - oxyCODONE-acetaminophen (PERCOCET) 5-325 MG tablet; Take 1 tablet by mouth every 6 (six) hours as needed for  severe pain.  Dispense: 30 tablet; Refill: 0 - ibuprofen (ADVIL,MOTRIN) 800 MG tablet; Take 1 tablet (800 mg total) by mouth every 6 (six) hours as needed.  Dispense: 30 tablet; Refill: 3  2. Chronic pain syndrome - oxyCODONE-acetaminophen (PERCOCET) 5-325 MG tablet; Take 1 tablet by mouth every 6 (six) hours as needed for severe pain.  Dispense: 30 tablet; Refill: 0 - ibuprofen (ADVIL,MOTRIN) 800 MG tablet; Take 1 tablet (800 mg total) by mouth every 6  (six) hours as needed.  Dispense: 30 tablet; Refill: 3  3. Chronic bilateral low back pain without sciatica - ibuprofen (ADVIL,MOTRIN) 800 MG tablet; Take 1 tablet (800 mg total) by mouth every 6 (six) hours as needed.  Dispense: 30 tablet; Refill: 3   Follow Up Instructions:  She will follow up in 2 weeks for re-assessment of pain.   I discussed the assessment and treatment plan with the patient. The patient was provided an opportunity to ask questions and all were answered. The patient agreed with the plan and demonstrated an understanding of the instructions.   The patient was advised to call back or seek an in-person evaluation if the symptoms worsen or if the condition fails to improve as anticipated.  I provided 15-20 minutes of non-face-to-face time during this encounter.   Kallie Locks, FNP

## 2018-05-14 ENCOUNTER — Telehealth: Payer: Self-pay

## 2018-05-14 NOTE — Telephone Encounter (Signed)
Spoke with [employer] and they will contact insurance to see if they will pay for 7 day supply and if not they will send a form for PA.

## 2018-05-15 DIAGNOSIS — M545 Low back pain: Secondary | ICD-10-CM

## 2018-05-15 DIAGNOSIS — G8929 Other chronic pain: Secondary | ICD-10-CM | POA: Insufficient documentation

## 2018-05-15 DIAGNOSIS — D571 Sickle-cell disease without crisis: Secondary | ICD-10-CM | POA: Insufficient documentation

## 2018-05-27 NOTE — Telephone Encounter (Signed)
Message sent to provider 

## 2018-06-06 ENCOUNTER — Encounter: Payer: Self-pay | Admitting: Family Medicine

## 2018-06-06 ENCOUNTER — Ambulatory Visit (INDEPENDENT_AMBULATORY_CARE_PROVIDER_SITE_OTHER): Payer: Managed Care, Other (non HMO) | Admitting: Family Medicine

## 2018-06-06 ENCOUNTER — Other Ambulatory Visit: Payer: Self-pay

## 2018-06-06 VITALS — BP 118/82 | HR 94 | Temp 98.2°F | Ht 61.0 in | Wt 168.0 lb

## 2018-06-06 DIAGNOSIS — F1190 Opioid use, unspecified, uncomplicated: Secondary | ICD-10-CM | POA: Diagnosis not present

## 2018-06-06 DIAGNOSIS — Z09 Encounter for follow-up examination after completed treatment for conditions other than malignant neoplasm: Secondary | ICD-10-CM

## 2018-06-06 DIAGNOSIS — D571 Sickle-cell disease without crisis: Secondary | ICD-10-CM | POA: Diagnosis not present

## 2018-06-06 DIAGNOSIS — Z79899 Other long term (current) drug therapy: Secondary | ICD-10-CM

## 2018-06-06 DIAGNOSIS — G894 Chronic pain syndrome: Secondary | ICD-10-CM | POA: Diagnosis not present

## 2018-06-06 LAB — POCT URINALYSIS DIP (MANUAL ENTRY)
Bilirubin, UA: NEGATIVE
Blood, UA: NEGATIVE
Glucose, UA: NEGATIVE mg/dL
Ketones, POC UA: NEGATIVE mg/dL
Leukocytes, UA: NEGATIVE
Nitrite, UA: NEGATIVE
Protein Ur, POC: NEGATIVE mg/dL
Spec Grav, UA: 1.015 (ref 1.010–1.025)
Urobilinogen, UA: 0.2 E.U./dL
pH, UA: 8.5 — AB (ref 5.0–8.0)

## 2018-06-06 MED ORDER — OXYCODONE-ACETAMINOPHEN 5-325 MG PO TABS: 1.0000 | ORAL_TABLET | ORAL | 0 refills | Status: AC | PRN

## 2018-06-06 NOTE — Progress Notes (Signed)
[facility]  Established Patient Office Visit  Subjective:  Patient ID: Tina Huber, female    DOB: August 03, 1975  Age: 43 y.o. MRN: [id number]  CC:  Chief Complaint  Patient presents with   Follow-up    sickle cell    HPI Tina Huber is a 43 year old female who presents for follow up today.   Past Medical History:  Diagnosis Date   Chronic pain syndrome    HIV (human immunodeficiency virus infection) (HCC)    Rheumatoid arthritis (HCC)    Sickle cell anemia (HCC)    Current Status: Since her last office visit, she is doing well with no complaints. She states that she has pain in her arms and legs. She rates her pain today at 5/10. She has not had a hospital visit for Sickle Cell Crisis since 01/26/2018 where she was treated and discharged the same day. She is currently taking all medications as prescribed and staying well hydrated. She is requesting refill on her pain medications. She states that pain medication is not controlling her pain lately. She reports occasional nausea, constipation, dizziness and headaches. She continues to follow up with Infection Disease and Rheumatologist as needed.   She denies fevers, chills, fatigue, recent infections, weight loss, and night sweats. She has not had any visual changes, and falls. No chest pain, heart palpitations, cough and shortness of breath reported. No reports of GI problems such as diarrhea, and constipation. She has no reports of blood in stools, dysuria and hematuria. No depression or anxiety reported.   Past Surgical History:  Procedure Laterality Date   OSTEOCHONDROMA EXCISION      Family History  Problem Relation Age of Onset   Stroke Brother    Diabetes Paternal Uncle     Social History   Socioeconomic History   Marital status: Married    Spouse name: Not on file   Number of children: Not on file   Years of education: Not on file   Highest education  level: Not on file  Occupational History   Occupation: LPN    Employer: [employer]  Social Needs   Financial resource strain: Not on file   Food insecurity:    Worry: Not on file    Inability: Not on file   Transportation needs:    Medical: Not on file    Non-medical: Not on file  Tobacco Use   Smoking status: Never Smoker   Smokeless tobacco: Never Used  Substance and Sexual Activity   Alcohol use: No    Alcohol/week: 0.0 standard drinks   Drug use: No   Sexual activity: Never    Partners: Male    Comment: No sex for 7 years   Lifestyle   Physical activity:    Days per week: Not on file    Minutes per session: Not on file   Stress: Not on file  Relationships   Social connections:    Talks on phone: Not on file    Gets together: Not on file    Attends religious service: Not on file    Active member of club or organization: Not on file    Attends meetings of clubs or organizations: Not on file    Relationship status: Not on file   Intimate partner violence:    Fear of current or ex partner: Not on file    Emotionally abused: Not on file    Physically abused: Not on file  Forced sexual activity: Not on file  Other Topics Concern   Not on file  Social History Narrative   Not on file    Outpatient Medications Prior to Visit  Medication Sig Dispense Refill   cyclobenzaprine (FLEXERIL) 10 MG tablet Take 1 tablet (10 mg total) by mouth at bedtime. 30 tablet 3   diclofenac (VOLTAREN) 75 MG EC tablet Take 1 tablet (75 mg total) by mouth 2 (two) times daily. 60 tablet 6   folic acid (FOLVITE) 1 MG tablet Take 1 mg by mouth daily.     gabapentin (NEURONTIN) 300 MG capsule Take 1 capsule (300 mg total) by mouth 3 (three) times daily. 90 capsule 6   ibuprofen (ADVIL,MOTRIN) 800 MG tablet Take 1 tablet (800 mg total) by mouth every 6 (six) hours as needed. 30 tablet 3   Multiple Vitamins-Minerals (MULTIVITAMIN WITH MINERALS) tablet Take 1 tablet by  mouth daily.     Vitamin D, Ergocalciferol, (DRISDOL) 50000 units CAPS capsule Take 1 capsule (50,000 Units total) by mouth every 7 (seven) days. 30 capsule 3   oxyCODONE-acetaminophen (PERCOCET) 5-325 MG tablet Take 1 tablet by mouth every 6 (six) hours as needed for severe pain. 30 tablet 0   POTASSIUM PO Take by mouth.     No facility-administered medications prior to visit.     Allergies  Allergen Reactions   Quinine Derivatives Itching    ROS Review of Systems  Constitutional: Negative.   HENT: Negative.   Eyes: Negative.   Respiratory: Negative.   Cardiovascular: Negative.   Gastrointestinal: Negative.   Endocrine: Negative.   Genitourinary: Negative.   Musculoskeletal: Positive for arthralgias (Chronic pain in arms and legs) and back pain (Chronic r/t Sick Cell Anemia).  Allergic/Immunologic: Negative.   Neurological: Positive for dizziness and headaches.  Hematological: Negative.   Psychiatric/Behavioral: Negative.       Objective:    Physical Exam  Constitutional: She is oriented to person, place, and time. She appears well-developed and well-nourished.  HENT:  Head: Normocephalic and atraumatic.  Eyes: Conjunctivae are normal.  Neck: Normal range of motion. Neck supple.  Cardiovascular: Normal rate, regular rhythm, normal heart sounds and intact distal pulses.  Pulmonary/Chest: Effort normal and breath sounds normal.  Abdominal: Soft. Bowel sounds are normal.  Musculoskeletal: Normal range of motion.     Comments: Pain in arms and legs.   Neurological: She is alert and oriented to person, place, and time. She has normal reflexes.  Skin: Skin is warm and dry.  Psychiatric: She has a normal mood and affect. Her behavior is normal. Judgment and thought content normal.  Nursing note and vitals reviewed.   BP 118/82 (BP Location: Right Arm, Patient Position: Sitting, Cuff Size: Large)    Pulse 94    Temp 98.2 F (36.8 C) (Oral)    Ht 5\' 1"  (1.549 m)    Wt  168 lb (76.2 kg)    SpO2 100%    BMI 31.74 kg/m  Wt Readings from Last 3 Encounters:  06/06/18 168 lb (76.2 kg)  01/10/18 155 lb (70.3 kg)  12/19/17 154 lb (69.9 kg)     Health Maintenance Due  Topic Date Due   PAP SMEAR-Modifier  03/14/2018    There are no preventive care reminders to display for this patient.  Lab Results  Component Value Date   TSH 0.564 05/31/2017   Lab Results  Component Value Date   WBC 3.9 05/31/2017   HGB 12.7 05/31/2017   HCT 37.7 05/31/2017  MCV 90 05/31/2017   PLT 222 05/31/2017   Lab Results  Component Value Date   NA 137 05/31/2017   K 4.3 05/31/2017   CO2 26 05/31/2017   GLUCOSE 97 05/31/2017   BUN 11 05/31/2017   CREATININE 0.77 05/31/2017   BILITOT 0.3 05/31/2017   ALKPHOS 63 05/31/2017   AST 28 05/31/2017   ALT 30 05/31/2017   PROT 7.4 05/31/2017   ALBUMIN 4.2 05/31/2017   CALCIUM 9.1 05/31/2017   ANIONGAP 11 02/19/2017   Lab Results  Component Value Date   CHOL 187 12/15/2015   Lab Results  Component Value Date   HDL 59 12/15/2015   Lab Results  Component Value Date   LDLCALC 107 (H) 12/15/2015   Lab Results  Component Value Date   TRIG 107 12/15/2015   Lab Results  Component Value Date   CHOLHDL 3.2 12/15/2015   No results found for: HGBA1C   Assessment & Plan:   1. 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 We will increase frequency of Percocet to every 4 hours as needed. She will continue to take all other medications as prescribed; will continue to avoid extreme heat and cold; will continue to eat a healthy diet and drink at least 64 ounces of water daily; continue stool softener as needed; will avoid colds and flu; will continue to get plenty of sleep and rest; will continue to avoid high stressful situations and remain infection free; will continue Folic Acid 1 mg daily to avoid sickle cell crisis.  - Ambulatory referral to Hematology  2. Chronic pain syndrome  3.  Chronic, continuous use of opioids  4. Medication management Percocet 5-325 mg dose frequency increased to every 4 hours as needed. We will re-assess effectiveness in 1 month.   5. Follow up She will follow up in 1 month.  - POCT urinalysis dipstick  Meds ordered this encounter  Medications   oxyCODONE-acetaminophen (PERCOCET/ROXICET) 5-325 MG tablet    Sig: Take 1 tablet by mouth every 4 (four) hours as needed for up to 15 days for severe pain.    Dispense:  60 tablet    Refill:  0    Order Specific Question:   Supervising Provider    Answer:   Quentin Angst [[id number]]    Orders Placed This Encounter  Procedures   Ambulatory referral to Hematology   POCT urinalysis dipstick     Referral Orders     Ambulatory referral to Hematology   Raliegh Ip,  MSN, FNP-C 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    Dispense:  60 tablet    Refill:  0    Order Specific Question:   Supervising  Provider    Answer:   Quentin AngstJEGEDE, OLUGBEMIGA E [[id number]]    Follow-up: Return in about 1 month (around 07/07/2018).    Kallie LocksNatalie M Dalisa Forrer, FNP

## 2018-06-19 ENCOUNTER — Telehealth: Payer: Self-pay | Admitting: Internal Medicine

## 2018-06-19 ENCOUNTER — Other Ambulatory Visit: Payer: Managed Care, Other (non HMO)

## 2018-06-19 NOTE — Telephone Encounter (Signed)
COVID-19 Pre-Screening Questions:06/19/18  Do you currently have a fever (>100 F), chills or unexplained body aches? NO  Are you currently experiencing new cough, shortness of breath, sore throat, runny nose? NO   Have you recently travelled outside the state of [place] in the last 14 days? NO    Have you been in contact with someone that is currently pending confirmation of Covid19 testing or has been confirmed to have the Covid19 virus?  NO  **If the patient answers NO to ALL questions -  advise the patient to please call the clinic before coming to the office should any symptoms develop.

## 2018-06-24 ENCOUNTER — Other Ambulatory Visit: Payer: Managed Care, Other (non HMO)

## 2018-06-24 ENCOUNTER — Other Ambulatory Visit: Payer: Self-pay

## 2018-06-24 DIAGNOSIS — B20 Human immunodeficiency virus [HIV] disease: Secondary | ICD-10-CM

## 2018-06-25 LAB — T-HELPER CELL (CD4) - (RCID CLINIC ONLY)
CD4 % Helper T Cell: 32 % — ABNORMAL LOW (ref 33–65)
CD4 T Cell Abs: 614 /uL (ref 400–1790)

## 2018-06-29 LAB — HIV-1 RNA QUANT-NO REFLEX-BLD
HIV 1 RNA Quant: <20 copies/mL — AB
HIV-1 RNA Quant, Log: <1.3 Log copies/mL — AB

## 2018-07-07 ENCOUNTER — Other Ambulatory Visit: Payer: Self-pay

## 2018-07-07 ENCOUNTER — Ambulatory Visit (HOSPITAL_COMMUNITY): Admission: RE | Admit: 2018-07-07 | Payer: Managed Care, Other (non HMO) | Source: Ambulatory Visit

## 2018-07-07 ENCOUNTER — Ambulatory Visit (INDEPENDENT_AMBULATORY_CARE_PROVIDER_SITE_OTHER): Payer: Managed Care, Other (non HMO) | Admitting: Family Medicine

## 2018-07-07 VITALS — BP 117/81 | HR 106 | Temp 99.1°F | Resp 14 | Ht 61.0 in | Wt 166.0 lb

## 2018-07-07 DIAGNOSIS — M79601 Pain in right arm: Secondary | ICD-10-CM | POA: Diagnosis not present

## 2018-07-07 DIAGNOSIS — G894 Chronic pain syndrome: Secondary | ICD-10-CM | POA: Diagnosis not present

## 2018-07-07 DIAGNOSIS — D571 Sickle-cell disease without crisis: Secondary | ICD-10-CM

## 2018-07-07 DIAGNOSIS — M79621 Pain in right upper arm: Secondary | ICD-10-CM | POA: Diagnosis not present

## 2018-07-07 DIAGNOSIS — Z09 Encounter for follow-up examination after completed treatment for conditions other than malignant neoplasm: Secondary | ICD-10-CM

## 2018-07-07 DIAGNOSIS — F1190 Opioid use, unspecified, uncomplicated: Secondary | ICD-10-CM

## 2018-07-07 DIAGNOSIS — Z79899 Other long term (current) drug therapy: Secondary | ICD-10-CM

## 2018-07-07 LAB — POCT URINALYSIS DIPSTICK
Bilirubin, UA: NEGATIVE
Blood, UA: NEGATIVE
Glucose, UA: NEGATIVE
Ketones, UA: NEGATIVE
Nitrite, UA: NEGATIVE
Protein, UA: NEGATIVE
Spec Grav, UA: 1.01 (ref 1.010–1.025)
Urobilinogen, UA: 0.2 E.U./dL
pH, UA: 5.5 (ref 5.0–8.0)

## 2018-07-07 MED ORDER — OXYCODONE-ACETAMINOPHEN 10-325 MG PO TABS: 1.0000 | ORAL_TABLET | ORAL | 0 refills | Status: DC | PRN

## 2018-07-07 NOTE — Progress Notes (Signed)
[facility]   Established Patient Office Visit  Subjective:  Patient ID: Tina Huber, female    DOB: [birth year]  Age: 43 y.o. MRN: [id number]  CC:  Chief Complaint  Patient presents with  . Sickle Cell Anemia    HPI Tina Huber is a 43 year old female who presents for Follow Up today.   Past Medical History:  Diagnosis Date  . Chronic pain syndrome   . HIV (human immunodeficiency virus infection) ([place])   . Rheumatoid arthritis ([place])   . Sickle cell anemia (HCC)     Current Status: Since her last office visit, she continues to use pain medications more frquently. She states that he has pain her right arm and legs. She rates her pain today at 8/10. She has not had a hospital visit for Sickle Cell Crisis since 01/26/2018 where she was treated and discharged the same day. She is currently taking all medications as prescribed and staying well hydrated. She reports occasional nausea, constipation, dizziness and headaches. She has a follow up appointment with Hematologist on Thursday.   She denies fevers, chills, fatigue, recent infections, weight loss, and night sweats. She has not had any headaches, visual changes, dizziness, and falls. No chest pain, heart palpitations, cough and shortness of breath reported. No reports of GI problems such as nausea, vomiting, diarrhea, and constipation. She has no reports of blood in stools, dysuria and hematuria. No depression or anxiety reported.    Past Surgical History:  Procedure Laterality Date  . OSTEOCHONDROMA EXCISION      Family History  Problem Relation Age of Onset  . Stroke Brother   . Diabetes Paternal Uncle     Social History   Socioeconomic History  . Marital status: Married    Spouse name: Not on file  . Number of children: Not on file  . Years of education: Not on file  . Highest education level: Not on file  Occupational History  . Occupation: LPN    Employer:  [employer]  Social Needs  . Financial resource strain: Not on file  . Food insecurity:    Worry: Not on file    Inability: Not on file  . Transportation needs:    Medical: Not on file    Non-medical: Not on file  Tobacco Use  . Smoking status: Never Smoker  . Smokeless tobacco: Never Used  Substance and Sexual Activity  . Alcohol use: No    Alcohol/week: 0.0 standard drinks  . Drug use: No  . Sexual activity: Never    Partners: Male    Comment: No sex for 7 years   Lifestyle  . Physical activity:    Days per week: Not on file    Minutes per session: Not on file  . Stress: Not on file  Relationships  . Social connections:    Talks on phone: Not on file    Gets together: Not on file    Attends religious service: Not on file    Active member of club or organization: Not on file    Attends meetings of clubs or organizations: Not on file    Relationship status: Not on file  . Intimate partner violence:    Fear of current or ex partner: Not on file    Emotionally abused: Not on file    Physically abused: Not on file    Forced sexual activity: Not on file  Other Topics Concern  . Not  on file  Social History Narrative  . Not on file    Outpatient Medications Prior to Visit  Medication Sig Dispense Refill  . cyclobenzaprine (FLEXERIL) 10 MG tablet Take 1 tablet (10 mg total) by mouth at bedtime. 30 tablet 3  . diclofenac (VOLTAREN) 75 MG EC tablet Take 1 tablet (75 mg total) by mouth 2 (two) times daily. 60 tablet 6  . folic acid (FOLVITE) 1 MG tablet Take 1 mg by mouth daily.    Marland Kitchen. gabapentin (NEURONTIN) 300 MG capsule Take 1 capsule (300 mg total) by mouth 3 (three) times daily. 90 capsule 6  . ibuprofen (ADVIL,MOTRIN) 800 MG tablet Take 1 tablet (800 mg total) by mouth every 6 (six) hours as needed. 30 tablet 3  . Multiple Vitamins-Minerals (MULTIVITAMIN WITH MINERALS) tablet Take 1 tablet by mouth daily.    Marland Kitchen. POTASSIUM PO Take by mouth.    . Vitamin D, Ergocalciferol,  (DRISDOL) 50000 units CAPS capsule Take 1 capsule (50,000 Units total) by mouth every 7 (seven) days. 30 capsule 3   No facility-administered medications prior to visit.     Allergies  Allergen Reactions  . Latex   . Quinine Derivatives Itching    ROS Review of Systems  Constitutional: Negative.   HENT: Negative.   Eyes: Negative.   Respiratory: Negative.   Cardiovascular: Negative.   Gastrointestinal: Negative.   Endocrine: Negative.   Genitourinary: Negative.   Musculoskeletal: Positive for arthralgias (arms and legs).  Skin: Negative.   Allergic/Immunologic: Negative.   Neurological: Positive for dizziness (Occasional) and headaches (Occasionally).  Hematological: Negative.   Psychiatric/Behavioral: Negative.       Objective:    Physical Exam  Constitutional: She is oriented to person, place, and time. She appears well-developed and well-nourished.  HENT:  Head: Normocephalic and atraumatic.  Eyes: Conjunctivae are normal.  Neck: Normal range of motion. Neck supple.  Cardiovascular: Normal rate, regular rhythm, normal heart sounds and intact distal pulses.  Pulmonary/Chest: Effort normal and breath sounds normal.  Abdominal: Soft. Bowel sounds are normal.  Musculoskeletal: Normal range of motion.  Neurological: She is alert and oriented to person, place, and time. She has normal reflexes.  Skin: Skin is warm and dry.  Psychiatric: She has a normal mood and affect. Her behavior is normal. Judgment and thought content normal.  Nursing note and vitals reviewed.   BP 117/81 (BP Location: Left Arm, Patient Position: Sitting, Cuff Size: Normal)   Pulse (!) 106   Temp 99.1 F (37.3 C) (Oral)   Resp 14   Ht 5\' 1"  (1.549 m)   Wt 166 lb (75.3 kg)   LMP 06/27/2018   SpO2 99%   BMI 31.37 kg/m  Wt Readings from Last 3 Encounters:  07/07/18 166 lb (75.3 kg)  06/06/18 168 lb (76.2 kg)  01/10/18 155 lb (70.3 kg)     Health Maintenance Due  Topic Date Due  . PAP  SMEAR-Modifier  03/14/2018    There are no preventive care reminders to display for this patient.  Lab Results  Component Value Date   TSH 0.564 05/31/2017   Lab Results  Component Value Date   WBC 3.9 05/31/2017   HGB 12.7 05/31/2017   HCT 37.7 05/31/2017   MCV 90 05/31/2017   PLT 222 05/31/2017   Lab Results  Component Value Date   NA 137 05/31/2017   K 4.3 05/31/2017   CO2 26 05/31/2017   GLUCOSE 97 05/31/2017   BUN 11 05/31/2017   CREATININE  0.77 05/31/2017   BILITOT 0.3 05/31/2017   ALKPHOS 63 05/31/2017   AST 28 05/31/2017   ALT 30 05/31/2017   PROT 7.4 05/31/2017   ALBUMIN 4.2 05/31/2017   CALCIUM 9.1 05/31/2017   ANIONGAP 11 02/19/2017   Lab Results  Component Value Date   CHOL 187 12/15/2015   Lab Results  Component Value Date   HDL 59 12/15/2015   Lab Results  Component Value Date   LDLCALC 107 (H) 12/15/2015   Lab Results  Component Value Date   TRIG 107 12/15/2015   Lab Results  Component Value Date   CHOLHDL 3.2 12/15/2015   No results found for: HGBA1C    Assessment & Plan:   1. Hb-SS disease without crisis (HCC) We will increased dosage of Percocet to 10-325 mg every 4 hours today for better pain management. Patient is advised to report to [facility] if pain continues to worsen. She will continue to take pain medications as prescribed; will continue to avoid extreme heat and cold; will continue to eat a healthy diet and drink at least 64 ounces of water daily; continue stool softener as needed; will avoid colds and flu; will continue to get plenty of sleep and rest; will continue to avoid high stressful situations and remain infection free; will continue Folic Acid 1 mg daily to avoid sickle cell crisis.  - Urinalysis Dipstick - oxyCODONE-acetaminophen (PERCOCET) 10-325 MG tablet; Take 1 tablet by mouth every 4 (four) hours as needed for up to 15 days for pain.  Dispense: 90 tablet; Refill: 0  2. Pain in right upper arm  3. Right arm  pain - VAS [place] UPPER EXTREMITY VENOUS DUPLEX; Future  4. Chronic pain syndrome - oxyCODONE-acetaminophen (PERCOCET) 10-325 MG tablet; Take 1 tablet by mouth every 4 (four) hours as needed for up to 15 days for pain.  Dispense: 90 tablet; Refill: 0  5. Chronic, continuous use of opioids - oxyCODONE-acetaminophen (PERCOCET) 10-325 MG tablet; Take 1 tablet by mouth every 4 (four) hours as needed for up to 15 days for pain.  Dispense: 90 tablet; Refill: 0  6. Medication management Percocet increased to 10-325 mg every 4 hours as needed.  7. Follow up She will follow up in 2 months.   Meds ordered this encounter  Medications  . oxyCODONE-acetaminophen (PERCOCET) 10-325 MG tablet    Sig: Take 1 tablet by mouth every 4 (four) hours as needed for up to 15 days for pain.    Dispense:  90 tablet    Refill:  0    Order Specific Question:   Supervising Provider    Answer:   Quentin Angst [id number]    Orders Placed This Encounter  Procedures  . Urinalysis Dipstick    Referral Orders  No referral(s) requested today    Raliegh Ip,  MSN, FNP-BC [facility] [address] [place], [place] [address] [phone]  Problem List Items Addressed This Visit      Other   Chronic pain syndrome   Relevant Medications   oxyCODONE-acetaminophen (PERCOCET) 10-325 MG tablet   Hb-SS disease without crisis (HCC) - Primary   Relevant Medications   oxyCODONE-acetaminophen (PERCOCET) 10-325 MG tablet   Other Relevant Orders   Urinalysis Dipstick (Completed)    Other Visit Diagnoses    Pain in right upper arm       Right arm pain       Relevant Orders   VAS [place] UPPER EXTREMITY VENOUS  DUPLEX   Chronic, continuous use of opioids       Relevant Medications   oxyCODONE-acetaminophen (PERCOCET) 10-325 MG tablet   Medication management       Follow up          Meds ordered this encounter  Medications  . oxyCODONE-acetaminophen (PERCOCET) 10-325  MG tablet    Sig: Take 1 tablet by mouth every 4 (four) hours as needed for up to 15 days for pain.    Dispense:  90 tablet    Refill:  0    Order Specific Question:   Supervising Provider    Answer:   Quentin Angst [[id number]]    Follow-up: Return in about 2 months (around 09/06/2018).    Kallie Locks, FNP

## 2018-07-08 ENCOUNTER — Other Ambulatory Visit: Payer: Self-pay

## 2018-07-08 ENCOUNTER — Ambulatory Visit (HOSPITAL_COMMUNITY)
Admission: RE | Admit: 2018-07-08 | Discharge: 2018-07-08 | Disposition: A | Discharge status: Home / Self Care | Payer: Managed Care, Other (non HMO) | Source: Ambulatory Visit | Attending: Family Medicine | Admitting: Family Medicine

## 2018-07-08 DIAGNOSIS — M79601 Pain in right arm: Secondary | ICD-10-CM | POA: Insufficient documentation

## 2018-07-08 NOTE — Progress Notes (Signed)
Upper extremity venous has been completed.   Preliminary results in CV Proc.   Abram Sander 07/08/2018 9:19 AM

## 2018-07-14 ENCOUNTER — Telehealth: Payer: Self-pay | Admitting: Internal Medicine

## 2018-07-14 NOTE — Telephone Encounter (Signed)
COVID-19 Pre-Screening Questions: ° °Do you currently have a fever (>100 °F), chills or unexplained body aches?no  ° °Are you currently experiencing new cough, shortness of breath, sore throat, runny nose?no  °•  °Have you recently travelled outside the state of [place] in the last 14 days? No  °•  °1. Have you been in contact with someone that is currently pending confirmation of Covid19 testing or has been confirmed to have the Covid19 virus?  No  ° °

## 2018-07-15 ENCOUNTER — Encounter: Payer: Self-pay | Admitting: Internal Medicine

## 2018-07-15 ENCOUNTER — Other Ambulatory Visit: Payer: Self-pay

## 2018-07-15 ENCOUNTER — Ambulatory Visit: Payer: Managed Care, Other (non HMO) | Admitting: Internal Medicine

## 2018-07-15 VITALS — BP 129/89 | HR 108 | Temp 99.2°F | Wt 166.0 lb

## 2018-07-15 DIAGNOSIS — B20 Human immunodeficiency virus [HIV] disease: Secondary | ICD-10-CM

## 2018-07-15 DIAGNOSIS — D571 Sickle-cell disease without crisis: Secondary | ICD-10-CM

## 2018-07-15 MED ORDER — DOXYCYCLINE MONOHYDRATE 100 MG PO TABS: 100.0000 mg | ORAL_TABLET | Freq: Two times a day (BID) | ORAL | 0 refills | Status: DC

## 2018-07-15 NOTE — Progress Notes (Signed)
   Subjective:    Patient ID: Tina Huber, female    DOB: [birth year], 43 y.o.   MRN: [id number]  HPI Here for follow up of HIV. She is a long-term non-progressor with a CD4 of 614 and viral load <20 but detected.  Highest has been 26 copies.  No new issues.  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  No associated weight loss or diarrhea.     Review of Systems  Constitutional: Negative for fatigue and unexpected weight change.  Skin: Negative for rash.       Objective:   Physical Exam Constitutional:      General: She is not in acute distress.    Appearance: She is well-developed.  HENT:     Mouth/Throat:     Pharynx: No oropharyngeal exudate.  Eyes:     General: No scleral icterus. Cardiovascular:     Rate and Rhythm: Normal rate and regular rhythm.     Heart sounds: Normal heart sounds. No murmur.  Pulmonary:     Effort: Pulmonary effort is normal. No respiratory distress.     Breath sounds: Normal breath sounds.    SH: no tobacco       Assessment & Plan:

## 2018-07-15 NOTE — Assessment & Plan Note (Addendum)
She continues to be a long term non-progressor.  Will continue with every 6 month follow up No medication indicated

## 2018-07-15 NOTE — Assessment & Plan Note (Signed)
Followed at [facility] and no acute issues at this time

## 2018-07-17 ENCOUNTER — Encounter (HOSPITAL_COMMUNITY): Payer: Self-pay

## 2018-08-30 ENCOUNTER — Other Ambulatory Visit: Payer: Self-pay | Admitting: Family Medicine

## 2018-09-08 ENCOUNTER — Encounter: Payer: Self-pay | Admitting: Family Medicine

## 2018-09-08 ENCOUNTER — Other Ambulatory Visit: Payer: Self-pay

## 2018-09-08 ENCOUNTER — Ambulatory Visit (INDEPENDENT_AMBULATORY_CARE_PROVIDER_SITE_OTHER): Payer: Managed Care, Other (non HMO) | Admitting: Family Medicine

## 2018-09-08 VITALS — BP 124/82 | HR 100 | Temp 98.8°F | Ht 61.0 in | Wt 161.0 lb

## 2018-09-08 DIAGNOSIS — Z0000 Encounter for general adult medical examination without abnormal findings: Secondary | ICD-10-CM | POA: Diagnosis not present

## 2018-09-08 DIAGNOSIS — F1190 Opioid use, unspecified, uncomplicated: Secondary | ICD-10-CM

## 2018-09-08 DIAGNOSIS — D571 Sickle-cell disease without crisis: Secondary | ICD-10-CM

## 2018-09-08 DIAGNOSIS — G894 Chronic pain syndrome: Secondary | ICD-10-CM

## 2018-09-08 DIAGNOSIS — Z09 Encounter for follow-up examination after completed treatment for conditions other than malignant neoplasm: Secondary | ICD-10-CM

## 2018-09-08 LAB — POCT URINALYSIS DIP (MANUAL ENTRY)
Bilirubin, UA: NEGATIVE
Blood, UA: NEGATIVE
Glucose, UA: NEGATIVE mg/dL
Ketones, POC UA: NEGATIVE mg/dL
Leukocytes, UA: NEGATIVE
Nitrite, UA: NEGATIVE
Protein Ur, POC: NEGATIVE mg/dL
Spec Grav, UA: 1.01 (ref 1.010–1.025)
Urobilinogen, UA: 0.2 E.U./dL
pH, UA: 7 (ref 5.0–8.0)

## 2018-09-08 MED ORDER — OXYCODONE-ACETAMINOPHEN 10-325 MG PO TABS: 1.0000 | ORAL_TABLET | Freq: Three times a day (TID) | ORAL | 0 refills | Status: AC | PRN

## 2018-09-08 NOTE — Progress Notes (Signed)
[facility]  Established Patient Office Visit  Subjective:  Patient ID: Tina Huber, female    DOB: [birth year]  Age: 43 y.o. MRN: [id number]  CC:  Chief Complaint  Patient presents with  . Follow-up    sickle cell    HPI Tina CanardKoisay Hamberger is a 43 year old female who presents for follow up today.   Past Medical History:  Diagnosis Date  . Chronic pain syndrome   . HIV (human immunodeficiency virus infection) (HCC)   . Rheumatoid arthritis (HCC)   . Sickle cell anemia (HCC)    Current Status: Since her last office visit, she is doing well with no complaints. She states that she has pain in her arms and legs. She rates her pain today at 5/10. She has not had a hospital visit for Sickle Cell Crisis since 01/27/2019 where she was treated and discharged the same day. She is currently taking all medications as prescribed and staying well hydrated. She reports occasional nausea, constipation, dizziness and headaches. She continues to follow up with Infection Disease as needed. She continues to follow up with Rheumatologist.   She denies fevers, chills, fatigue, recent infections, weight loss, and night sweats. She has not had any visual changes, and falls. No chest pain, heart palpitations, cough and shortness of breath reported. No reports of GI problems such as vomiting, and diarrhea. She has no reports of blood in stools, dysuria and hematuria. No depression or anxiety reported.   Past Surgical History:  Procedure Laterality Date  . OSTEOCHONDROMA EXCISION      Family History  Problem Relation Age of Onset  . Stroke Brother   . Diabetes Paternal Uncle     Social History   Socioeconomic History  . Marital status: Married    Spouse name: Not on file  . Number of children: Not on file  . Years of education: Not on file  . Highest education level: Not on file  Occupational History  . Occupation: LPN    Employer: [employer]   Social Needs  . Financial resource strain: Not on file  . Food insecurity    Worry: Not on file    Inability: Not on file  . Transportation needs    Medical: Not on file    Non-medical: Not on file  Tobacco Use  . Smoking status: Never Smoker  . Smokeless tobacco: Never Used  Substance and Sexual Activity  . Alcohol use: No    Alcohol/week: 0.0 standard drinks  . Drug use: No  . Sexual activity: Never    Partners: Male    Comment: No sex for 7 years   Lifestyle  . Physical activity    Days per week: Not on file    Minutes per session: Not on file  . Stress: Not on file  Relationships  . Social Musicianconnections    Talks on phone: Not on file    Gets together: Not on file    Attends religious service: Not on file    Active member of club or organization: Not on file    Attends meetings of clubs or organizations: Not on file    Relationship status: Not on file  . Intimate partner violence    Fear of current or ex partner: Not on file    Emotionally abused: Not on file    Physically abused: Not on file    Forced sexual activity: Not on file  Other Topics Concern  .  Not on file  Social History Narrative  . Not on file    Outpatient Medications Prior to Visit  Medication Sig Dispense Refill  . cyclobenzaprine (FLEXERIL) 10 MG tablet Take 1 tablet (10 mg total) by mouth at bedtime. 30 tablet 3  . diclofenac (VOLTAREN) 75 MG EC tablet Take 1 tablet (75 mg total) by mouth 2 (two) times daily. 60 tablet 6  . folic acid (FOLVITE) 1 MG tablet Take 1 mg by mouth daily.    Marland Kitchen gabapentin (NEURONTIN) 300 MG capsule TAKE 1 CAPSULE BY MOUTH THREE TIMES DAILY 90 capsule 0  . Multiple Vitamins-Minerals (MULTIVITAMIN WITH MINERALS) tablet Take 1 tablet by mouth daily.    Marland Kitchen POTASSIUM PO Take by mouth.    . Vitamin D, Ergocalciferol, (DRISDOL) 50000 units CAPS capsule Take 1 capsule (50,000 Units total) by mouth every 7 (seven) days. 30 capsule 3  . doxycycline (ADOXA) 100 MG tablet Take 1  tablet (100 mg total) by mouth 2 (two) times daily. (Patient not taking: Reported on 09/08/2018) 10 tablet 0  . ibuprofen (ADVIL,MOTRIN) 800 MG tablet Take 1 tablet (800 mg total) by mouth every 6 (six) hours as needed. (Patient not taking: Reported on 09/08/2018) 30 tablet 3   No facility-administered medications prior to visit.     Allergies  Allergen Reactions  . Latex     Gloves- made hands itch and burn  . Quinine Derivatives Itching    ROS Review of Systems  Constitutional: Negative.   HENT: Negative.   Eyes: Negative.   Respiratory: Negative.   Cardiovascular: Negative.   Gastrointestinal: Positive for constipation (occasional ) and nausea (occassional ).  Endocrine: Negative.   Genitourinary: Negative.   Musculoskeletal: Positive for arthralgias (generalized joint  pain ).  Skin: Negative.   Allergic/Immunologic: Negative.   Neurological: Positive for dizziness (occasional) and headaches (occasional ).  Hematological: Negative.   Psychiatric/Behavioral: Negative.       Objective:    Physical Exam  Constitutional: She is oriented to person, place, and time. She appears well-developed and well-nourished.  HENT:  Head: Normocephalic and atraumatic.  Eyes: Conjunctivae are normal.  Neck: Normal range of motion. Neck supple.  Cardiovascular: Normal rate, regular rhythm, normal heart sounds and intact distal pulses.  Pulmonary/Chest: Effort normal and breath sounds normal.  Abdominal: Soft. Bowel sounds are normal.  Musculoskeletal: Normal range of motion.  Neurological: She is alert and oriented to person, place, and time. She has normal reflexes.  Skin: Skin is warm and dry.  Psychiatric: She has a normal mood and affect. Her behavior is normal. Judgment and thought content normal.  Nursing note and vitals reviewed.   BP 124/82 (BP Location: Left Arm, Patient Position: Sitting, Cuff Size: Large)   Pulse 100   Temp 98.8 F (37.1 C) (Oral)   Ht 5\' 1"  (1.549 m)    Wt 161 lb (73 kg)   LMP 08/25/2018   SpO2 99%   BMI 30.42 kg/m  Wt Readings from Last 3 Encounters:  09/08/18 161 lb (73 kg)  07/15/18 166 lb (75.3 kg)  07/07/18 166 lb (75.3 kg)     Health Maintenance Due  Topic Date Due  . PAP SMEAR-Modifier  03/14/2018  . INFLUENZA VACCINE  08/30/2018    There are no preventive care reminders to display for this patient.  Lab Results  Component Value Date   TSH 0.564 05/31/2017   Lab Results  Component Value Date   WBC 3.9 05/31/2017   HGB 12.7 05/31/2017  HCT 37.7 05/31/2017   MCV 90 05/31/2017   PLT 222 05/31/2017   Lab Results  Component Value Date   NA 137 05/31/2017   K 4.3 05/31/2017   CO2 26 05/31/2017   GLUCOSE 97 05/31/2017   BUN 11 05/31/2017   CREATININE 0.77 05/31/2017   BILITOT 0.3 05/31/2017   ALKPHOS 63 05/31/2017   AST 28 05/31/2017   ALT 30 05/31/2017   PROT 7.4 05/31/2017   ALBUMIN 4.2 05/31/2017   CALCIUM 9.1 05/31/2017   ANIONGAP 11 02/19/2017   Lab Results  Component Value Date   CHOL 187 12/15/2015   Lab Results  Component Value Date   HDL 59 12/15/2015   Lab Results  Component Value Date   LDLCALC 107 (H) 12/15/2015   Lab Results  Component Value Date   TRIG 107 12/15/2015   Lab Results  Component Value Date   CHOLHDL 3.2 12/15/2015   No results found for: HGBA1C    Assessment & Plan:   1. Hb-SS disease without crisis [facility]) Since her last office visit, she is doing well with no complaints. She states that her has pain in her legs. She rates her pain today at 5/10. She has not had a hospital visit for Sickle Cell Crisis since 01/27/2019 where she was treated and discharged the same day. She is currently taking all medications as prescribed and staying well hydrated. She reports occasional nausea, constipation, dizziness and headaches.  - oxyCODONE-acetaminophen (PERCOCET) 10-325 MG tablet; Take 1 tablet by mouth every 8 (eight) hours as needed for pain.  Dispense: 60 tablet; Refill:  0  2. Chronic pain syndrome - oxyCODONE-acetaminophen (PERCOCET) 10-325 MG tablet; Take 1 tablet by mouth every 8 (eight) hours as needed for pain.  Dispense: 60 tablet; Refill: 0  3. Chronic, continuous use of opioids  4. Healthcare maintenance - Hemoglobinopathy evaluation - Comprehensive metabolic panel - CBC with Differential - TSH - Lipid Panel  5. Follow up She will follow up in 4 months.  - POCT urinalysis dipstick    Meds ordered this encounter  Medications  . oxyCODONE-acetaminophen (PERCOCET) 10-325 MG tablet    Sig: Take 1 tablet by mouth every 8 (eight) hours as needed for pain.    Dispense:  60 tablet    Refill:  0    Orders Placed This Encounter  Procedures  . Hemoglobinopathy evaluation  . Comprehensive metabolic panel  . CBC with Differential  . TSH  . Lipid Panel  . POCT urinalysis dipstick    Referral Orders  No referral(s) requested today    Kathe Becton,  MSN, FNP-BC [facility] [address] [place], [place] [address] [phone]- fax  Problem List Items Addressed This Visit      Other   Chronic pain syndrome   Relevant Medications   oxyCODONE-acetaminophen (PERCOCET) 10-325 MG tablet   Hb-SS disease without crisis ([place]) - Primary   Relevant Medications   oxyCODONE-acetaminophen (PERCOCET) 10-325 MG tablet    Other Visit Diagnoses    Chronic, continuous use of opioids       Healthcare maintenance       Relevant Orders   Hemoglobinopathy evaluation   Comprehensive metabolic panel   CBC with Differential   TSH   Lipid Panel   Follow up       Relevant Orders   POCT urinalysis dipstick (Completed)      Meds ordered this encounter  Medications  .  oxyCODONE-acetaminophen (PERCOCET) 10-325 MG tablet    Sig: Take 1 tablet by mouth every 8 (eight) hours as needed for pain.    Dispense:  60 tablet    Refill:  0    Follow-up: Return in about  4 months (around 01/08/2019).    Kallie Locks, FNP

## 2018-09-10 LAB — LIPID PANEL
Chol/HDL Ratio: 3 ratio (ref 0.0–4.4)
Cholesterol, Total: 206 mg/dL — ABNORMAL HIGH (ref 100–199)
HDL: 69 mg/dL (ref >39)
LDL Calculated: 126 mg/dL — ABNORMAL HIGH (ref 0–99)
Triglycerides: 56 mg/dL (ref 0–149)
VLDL Cholesterol Cal: 11 mg/dL (ref 5–40)

## 2018-09-10 LAB — HEMOGLOBINOPATHY EVALUATION
HGB C: 0 %
HGB S: 39.4 % — ABNORMAL HIGH
HGB VARIANT: 0 %
Hemoglobin A2 Quantitation: 4.2 % — ABNORMAL HIGH (ref 1.8–3.2)
Hemoglobin F Quantitation: 0.7 % (ref 0.0–2.0)
Hgb A: 55.7 % — ABNORMAL LOW (ref 96.4–98.8)

## 2018-09-10 LAB — COMPREHENSIVE METABOLIC PANEL
ALT: 19 IU/L (ref 0–32)
AST: 22 IU/L (ref 0–40)
Albumin/Globulin Ratio: 1.4 (ref 1.2–2.2)
Albumin: 4.5 g/dL (ref 3.8–4.8)
Alkaline Phosphatase: 81 IU/L (ref 39–117)
BUN/Creatinine Ratio: 12 (ref 9–23)
BUN: 8 mg/dL (ref 6–24)
Bilirubin Total: 0.2 mg/dL (ref 0.0–1.2)
CO2: 24 mmol/L (ref 20–29)
Calcium: 9.5 mg/dL (ref 8.7–10.2)
Chloride: 100 mmol/L (ref 96–106)
Creatinine, Ser: 0.68 mg/dL (ref 0.57–1.00)
GFR calc Af Amer: 126 mL/min/{1.73_m2} (ref >59)
GFR calc non Af Amer: 109 mL/min/{1.73_m2} (ref >59)
Globulin, Total: 3.3 g/dL (ref 1.5–4.5)
Glucose: 80 mg/dL (ref 65–99)
Potassium: 3.9 mmol/L (ref 3.5–5.2)
Sodium: 138 mmol/L (ref 134–144)
Total Protein: 7.8 g/dL (ref 6.0–8.5)

## 2018-09-10 LAB — CBC WITH DIFFERENTIAL/PLATELET
Basophils Absolute: 0 10*3/uL (ref 0.0–0.2)
Basos: 0 %
EOS (ABSOLUTE): 0.1 10*3/uL (ref 0.0–0.4)
Eos: 3 %
Hematocrit: 35.4 % (ref 34.0–46.6)
Hemoglobin: 12.2 g/dL (ref 11.1–15.9)
Immature Grans (Abs): 0 10*3/uL (ref 0.0–0.1)
Immature Granulocytes: 1 %
Lymphocytes Absolute: 2.7 10*3/uL (ref 0.7–3.1)
Lymphs: 50 %
MCH: 29.3 pg (ref 26.6–33.0)
MCHC: 34.5 g/dL (ref 31.5–35.7)
MCV: 85 fL (ref 79–97)
Monocytes Absolute: 0.4 10*3/uL (ref 0.1–0.9)
Monocytes: 8 %
Neutrophils Absolute: 2 10*3/uL (ref 1.4–7.0)
Neutrophils: 38 %
Platelets: 270 10*3/uL (ref 150–450)
RBC: 4.17 x10E6/uL (ref 3.77–5.28)
RDW: 13.5 % (ref 11.7–15.4)
WBC: 5.3 10*3/uL (ref 3.4–10.8)

## 2018-09-10 LAB — TSH: TSH: 1.01 u[IU]/mL (ref 0.450–4.500)

## 2018-10-08 ENCOUNTER — Other Ambulatory Visit: Payer: Self-pay

## 2018-10-08 ENCOUNTER — Encounter: Payer: Self-pay | Admitting: Family Medicine

## 2018-10-08 ENCOUNTER — Ambulatory Visit (INDEPENDENT_AMBULATORY_CARE_PROVIDER_SITE_OTHER): Payer: Managed Care, Other (non HMO) | Admitting: Family Medicine

## 2018-10-08 VITALS — BP 116/73 | HR 115 | Ht 61.0 in | Wt 156.8 lb

## 2018-10-08 DIAGNOSIS — M545 Low back pain, unspecified: Secondary | ICD-10-CM

## 2018-10-08 DIAGNOSIS — Z23 Encounter for immunization: Secondary | ICD-10-CM | POA: Diagnosis not present

## 2018-10-08 DIAGNOSIS — B20 Human immunodeficiency virus [HIV] disease: Secondary | ICD-10-CM

## 2018-10-08 DIAGNOSIS — Z09 Encounter for follow-up examination after completed treatment for conditions other than malignant neoplasm: Secondary | ICD-10-CM

## 2018-10-08 DIAGNOSIS — M62838 Other muscle spasm: Secondary | ICD-10-CM | POA: Diagnosis not present

## 2018-10-08 DIAGNOSIS — G8929 Other chronic pain: Secondary | ICD-10-CM

## 2018-10-08 MED ORDER — CYCLOBENZAPRINE HCL 10 MG PO TABS: 10.0000 mg | ORAL_TABLET | Freq: Every day | ORAL | 3 refills | Status: DC

## 2018-10-08 MED ORDER — GABAPENTIN 300 MG PO CAPS: 300.0000 mg | ORAL_CAPSULE | Freq: Three times a day (TID) | ORAL | 3 refills | Status: DC

## 2018-10-08 NOTE — Progress Notes (Signed)
[facility]  Established Patient Office Visit  Subjective:  Patient ID: Tina Huber, female    DOB: 12-25-1975  Age: 42 y.o. MRN: [id number]  CC:  Chief Complaint  Patient presents with  . Follow-up    63month follow up , weakness, cramping legs     HPI Tina Huber is 43 year old female who presents for Follow Up today.  . Past Medical History:  Diagnosis Date  . Chronic pain syndrome   . HIV (human immunodeficiency virus infection) (HCC)   . Rheumatoid arthritis (HCC)   . Sickle cell trait (HCC)    Current Status: Since her last office visit, she is doing well with no complaints. She denies fevers, chills, fatigue, recent infections, weight loss, and night sweats. She has not had any headaches, visual changes, dizziness, and falls. No chest pain, heart palpitations, cough and shortness of breath reported. No reports of GI problems such as nausea, vomiting, diarrhea, and constipation. She has no reports of blood in stools, dysuria and hematuria. No depression or anxiety, and denies suicidal ideations, homicidal ideations, or auditory hallucinations. She denies pain today. Her anxiety is mild today. She denies suicidal ideations, homicidal ideations, or auditory hallucinations. She continues to follow up with Infection Disease as needed.   She denies fevers, chills, fatigue, recent infections, weight loss, and night sweats. She has not had any visual changes, and falls. No chest pain, heart palpitations, cough and shortness of breath reported. No reports of GI problems such as diarrhea, and constipation. She has no reports of blood in stools, dysuria and hematuria. She denies pain today.   Past Surgical History:  Procedure Laterality Date  . OSTEOCHONDROMA EXCISION      Family History  Problem Relation Age of Onset  . Stroke Brother   . Diabetes Paternal Uncle     Social History   Socioeconomic History  . Marital status:  Married    Spouse name: Not on file  . Number of children: Not on file  . Years of education: Not on file  . Highest education level: Not on file  Occupational History  . 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 Financial resource strain: Not on file  . Food insecurity    Worry: Not on file    Inability: Not on file  . Transportation needs    Medical: Not on file    Non-medical: Not on file  Tobacco Use  . Smoking status: Never Smoker  . Smokeless tobacco: Never Used  Substance and Sexual Activity  . Alcohol use: No    Alcohol/week: 0.0 standard drinks  . Drug use: No  . Sexual activity: Never    Partners: Male    Comment: No sex for 7 years   Lifestyle  . Physical activity    Days per week: Not on file    Minutes per session: Not on file  . Stress: Not on file  Relationships  . Social Musician on phone: Not on file    Gets together: Not on file    Attends religious service: Not on file    Active member of club or organization: Not on file    Attends meetings of clubs or organizations: Not on file    Relationship status: Not on file  . Intimate partner violence    Fear of current or ex partner: Not on file    Emotionally abused: Not  on file    Physically abused: Not on file    Forced sexual activity: Not on file  Other Topics Concern  . Not on file  Social History Narrative  . Not on file    Outpatient Medications Prior to Visit  Medication Sig Dispense Refill  . diclofenac (VOLTAREN) 75 MG EC tablet Take 1 tablet (75 mg total) by mouth 2 (two) times daily. 60 tablet 6  . folic acid (FOLVITE) 1 MG tablet Take 1 mg by mouth daily.    . Multiple Vitamins-Minerals (MULTIVITAMIN WITH MINERALS) tablet Take 1 tablet by mouth daily.    Marland Kitchen oxyCODONE-acetaminophen (PERCOCET) 10-325 MG tablet Take 1 tablet by mouth every 8 (eight) hours as needed for pain. 60 tablet 0  . Vitamin D, Ergocalciferol, (DRISDOL) 50000 units CAPS capsule Take 1  capsule (50,000 Units total) by mouth every 7 (seven) days. 30 capsule 3  . cyclobenzaprine (FLEXERIL) 10 MG tablet Take 1 tablet (10 mg total) by mouth at bedtime. 30 tablet 3  . gabapentin (NEURONTIN) 300 MG capsule TAKE 1 CAPSULE BY MOUTH THREE TIMES DAILY 90 capsule 0  . POTASSIUM PO Take by mouth.     No facility-administered medications prior to visit.     Allergies  Allergen Reactions  . Latex     Gloves- made hands itch and burn  . Quinine Derivatives Itching    ROS Review of Systems  Constitutional: Negative.   HENT: Negative.   Eyes: Negative.   Respiratory: Negative.   Cardiovascular: Negative.   Gastrointestinal: Negative.   Endocrine: Negative.   Genitourinary: Negative.   Musculoskeletal: Positive for back pain (chronic back pain).  Skin: Negative.   Allergic/Immunologic: Negative.   Neurological: Negative.   Hematological: Negative.   Psychiatric/Behavioral: Negative.       Objective:    Physical Exam  Constitutional: She is oriented to person, place, and time. She appears well-developed and well-nourished.  HENT:  Head: Normocephalic and atraumatic.  Eyes: Conjunctivae are normal.  Neck: Normal range of motion. Neck supple.  Cardiovascular: Normal rate, regular rhythm, normal heart sounds and intact distal pulses.  Pulmonary/Chest: Effort normal and breath sounds normal.  Abdominal: Soft. Bowel sounds are normal.  Musculoskeletal: Normal range of motion.  Neurological: She is alert and oriented to person, place, and time. She has normal reflexes.  Skin: Skin is warm and dry.  Psychiatric: She has a normal mood and affect. Her behavior is normal. Judgment and thought content normal.  Nursing note and vitals reviewed.  BP 116/73 (BP Location: Left Arm, Patient Position: Sitting, Cuff Size: Normal)   Pulse (!) 115   Ht 5\' 1"  (1.549 m)   Wt 156 lb 12.8 oz (71.1 kg)   LMP 09/19/2018   SpO2 99%   BMI 29.63 kg/m  Wt Readings from Last 3 Encounters:   10/08/18 156 lb 12.8 oz (71.1 kg)  09/08/18 161 lb (73 kg)  07/15/18 166 lb (75.3 kg)   Health Maintenance Due  Topic Date Due  . PAP SMEAR-Modifier  03/14/2018  . INFLUENZA VACCINE  08/30/2018   There are no preventive care reminders to display for this patient.  Lab Results  Component Value Date   TSH 1.010 09/08/2018   Lab Results  Component Value Date   WBC 5.3 09/08/2018   HGB 12.2 09/08/2018   HCT 35.4 09/08/2018   MCV 85 09/08/2018   PLT 270 09/08/2018   Lab Results  Component Value Date   NA 138 09/08/2018   K  3.9 09/08/2018   CO2 24 09/08/2018   GLUCOSE 80 09/08/2018   BUN 8 09/08/2018   CREATININE 0.68 09/08/2018   BILITOT 0.2 09/08/2018   ALKPHOS 81 09/08/2018   AST 22 09/08/2018   ALT 19 09/08/2018   PROT 7.8 09/08/2018   ALBUMIN 4.5 09/08/2018   CALCIUM 9.5 09/08/2018   ANIONGAP 11 02/19/2017   Lab Results  Component Value Date   CHOL 206 (H) 09/08/2018   Lab Results  Component Value Date   HDL 69 09/08/2018   Lab Results  Component Value Date   LDLCALC 126 (H) 09/08/2018   Lab Results  Component Value Date   TRIG 56 09/08/2018   Lab Results  Component Value Date   CHOLHDL 3.0 09/08/2018   No results found for: HGBA1C   Assessment & Plan:   1. Chronic bilateral low back pain without sciatica - cyclobenzaprine (FLEXERIL) 10 MG tablet; Take 1 tablet (10 mg total) by mouth at bedtime.  Dispense: 30 tablet; Refill: 3  2. Night muscle spasms - cyclobenzaprine (FLEXERIL) 10 MG tablet; Take 1 tablet (10 mg total) by mouth at bedtime.  Dispense: 30 tablet; Refill: 3  3. Human immunodeficiency virus (HIV) disease (HCC)  4. Need for influenza vaccination - Flu Vaccine QUAD 6+ mos PF IM (Fluarix Quad PF)  5. Follow up She will follow up in 3 months.   Meds ordered this encounter  Medications  . gabapentin (NEURONTIN) 300 MG capsule    Sig: Take 1 capsule (300 mg total) by mouth 3 (three) times daily.    Dispense:  90 capsule     Refill:  3  . cyclobenzaprine (FLEXERIL) 10 MG tablet    Sig: Take 1 tablet (10 mg total) by mouth at bedtime.    Dispense:  30 tablet    Refill:  3    Orders Placed This Encounter  Procedures  . Flu Vaccine QUAD 6+ mos PF IM (Fluarix Quad PF)    Referral Orders  No referral(s) requested today    Raliegh Ip,  MSN, FNP-BC [facility] [address] [place], [place] [address] [phone]- fax    Problem List Items Addressed This Visit      Other   Chronic bilateral low back pain without sciatica - Primary   Relevant Medications   cyclobenzaprine (FLEXERIL) 10 MG tablet   Human immunodeficiency virus (HIV) disease (HCC)   Night muscle spasms   Relevant Medications   cyclobenzaprine (FLEXERIL) 10 MG tablet    Other Visit Diagnoses    Need for influenza vaccination       Relevant Orders   Flu Vaccine QUAD 6+ mos PF IM (Fluarix Quad PF) (Completed)   Follow up          Meds ordered this encounter  Medications  . gabapentin (NEURONTIN) 300 MG capsule    Sig: Take 1 capsule (300 mg total) by mouth 3 (three) times daily.    Dispense:  90 capsule    Refill:  3  . cyclobenzaprine (FLEXERIL) 10 MG tablet    Sig: Take 1 tablet (10 mg total) by mouth at bedtime.    Dispense:  30 tablet    Refill:  3    Follow-up: Return in about 3 months (around 01/07/2019).    Kallie Locks, FNP

## 2018-10-10 ENCOUNTER — Telehealth: Payer: Self-pay

## 2018-10-10 NOTE — Telephone Encounter (Signed)
-----   Message from Natalie M Stroud, FNP sent at 10/08/2018  4:30 PM EDT ----- Regarding: "Influenza Vaccine" Hey Taleen Prosser!  Do you remember if Ms. Iyengar received the Influenza Vaccine today? She was our last patient.  If so, could you please mark in chart, then I will be able to close chart. Thank you for all you do!  

## 2018-10-10 NOTE — Telephone Encounter (Signed)
-----   Message from Azzie Glatter, [place] sent at 10/08/2018  4:30 PM EDT ----- Regarding: "Influenza Vaccine" Arther Abbott!  Do you remember if Ms. Pilz received the Influenza Vaccine today? She was our last patient.  If so, could you please mark in chart, then I will be able to close chart. Thank you for all you do!

## 2018-10-10 NOTE — Telephone Encounter (Signed)
Pt did receive a  Flu shot documented in the pt's chart.

## 2018-11-11 ENCOUNTER — Encounter: Payer: Self-pay | Admitting: Family Medicine

## 2018-11-21 ENCOUNTER — Other Ambulatory Visit: Payer: Self-pay | Admitting: Family Medicine

## 2018-11-21 DIAGNOSIS — L0291 Cutaneous abscess, unspecified: Secondary | ICD-10-CM

## 2018-11-21 MED ORDER — CEPHALEXIN 500 MG PO CAPS: 500.0000 mg | ORAL_CAPSULE | Freq: Four times a day (QID) | ORAL | 0 refills | Status: AC

## 2018-12-22 DIAGNOSIS — E282 Polycystic ovarian syndrome: Secondary | ICD-10-CM | POA: Insufficient documentation

## 2019-01-07 ENCOUNTER — Ambulatory Visit: Payer: Managed Care, Other (non HMO) | Admitting: Family Medicine

## 2019-01-14 ENCOUNTER — Other Ambulatory Visit: Payer: Managed Care, Other (non HMO)

## 2019-01-15 ENCOUNTER — Other Ambulatory Visit: Payer: Self-pay

## 2019-01-15 ENCOUNTER — Other Ambulatory Visit: Payer: Managed Care, Other (non HMO)

## 2019-01-15 DIAGNOSIS — B20 Human immunodeficiency virus [HIV] disease: Secondary | ICD-10-CM

## 2019-01-16 LAB — T-HELPER CELL (CD4) - (RCID CLINIC ONLY)
CD4 % Helper T Cell: 35 % (ref 33–65)
CD4 T Cell Abs: 815 /uL (ref 400–1790)

## 2019-01-19 LAB — HIV-1 RNA QUANT-NO REFLEX-BLD
HIV 1 RNA Quant: 44 copies/mL — ABNORMAL HIGH
HIV-1 RNA Quant, Log: 1.64 Log copies/mL — ABNORMAL HIGH

## 2019-02-02 ENCOUNTER — Encounter: Payer: Self-pay | Admitting: Internal Medicine

## 2019-02-02 ENCOUNTER — Ambulatory Visit: Payer: Managed Care, Other (non HMO) | Admitting: Internal Medicine

## 2019-02-02 ENCOUNTER — Other Ambulatory Visit: Payer: Self-pay

## 2019-02-02 VITALS — BP 126/85 | HR 101 | Wt 151.0 lb

## 2019-02-02 DIAGNOSIS — Z113 Encounter for screening for infections with a predominantly sexual mode of transmission: Secondary | ICD-10-CM | POA: Diagnosis not present

## 2019-02-02 DIAGNOSIS — B20 Human immunodeficiency virus [HIV] disease: Secondary | ICD-10-CM

## 2019-02-02 DIAGNOSIS — D571 Sickle-cell disease without crisis: Secondary | ICD-10-CM

## 2019-02-02 NOTE — Progress Notes (Signed)
   Subjective:    Patient ID: Tina Huber, female    DOB: Jan 04, 1976, 44 y.o.   MRN: [id number]  HPI Here for follow up of HIV. She is a long-term non-progressor with a CD4 of 815 and viral load just 44.  Followed at [facility] for ? Sickle cell.  Chronic issues with pain. Complaint of bilateral hand swelling and going to her rheumatologist.  Has remained off of antiretrovirals.    Review of Systems  Constitutional: Negative for fatigue and unexpected weight change.  Skin: Negative for rash.       Objective:   Physical Exam Constitutional:      General: She is not in acute distress.    Appearance: She is well-developed.  Eyes:     General: No scleral icterus. Pulmonary:     Effort: Pulmonary effort is normal. No respiratory distress.  Skin:    Findings: No rash.  Psychiatric:        Mood and Affect: Mood normal.    SH: no tobacco       Assessment & Plan:

## 2019-02-02 NOTE — Assessment & Plan Note (Signed)
I discussed again consideration of treatment with ARVs. Though there is some suggestion of benefit with any viral load, her immune system is intact.  I discussed with her my discussion with ID at [facility].  At this time, she remains hesitant to start but will consider and call back if she decides.

## 2019-02-02 NOTE — Assessment & Plan Note (Signed)
As above, I discussed the potential benefit of HIV treatment to decrease inflammation and possibly reduce pain issues.  She will consider.

## 2019-02-04 ENCOUNTER — Ambulatory Visit: Payer: Managed Care, Other (non HMO) | Admitting: Family Medicine

## 2019-02-23 ENCOUNTER — Other Ambulatory Visit: Payer: Self-pay

## 2019-02-23 ENCOUNTER — Encounter: Payer: Self-pay | Admitting: Family Medicine

## 2019-02-23 ENCOUNTER — Ambulatory Visit (INDEPENDENT_AMBULATORY_CARE_PROVIDER_SITE_OTHER): Payer: Managed Care, Other (non HMO) | Admitting: Family Medicine

## 2019-02-23 VITALS — BP 125/76 | HR 105 | Temp 97.9°F | Ht 61.0 in | Wt 149.4 lb

## 2019-02-23 DIAGNOSIS — R5383 Other fatigue: Secondary | ICD-10-CM | POA: Diagnosis not present

## 2019-02-23 DIAGNOSIS — G6289 Other specified polyneuropathies: Secondary | ICD-10-CM

## 2019-02-23 DIAGNOSIS — Z09 Encounter for follow-up examination after completed treatment for conditions other than malignant neoplasm: Secondary | ICD-10-CM

## 2019-02-23 DIAGNOSIS — M79603 Pain in arm, unspecified: Secondary | ICD-10-CM

## 2019-02-23 DIAGNOSIS — D573 Sickle-cell trait: Secondary | ICD-10-CM | POA: Diagnosis not present

## 2019-02-23 DIAGNOSIS — M79606 Pain in leg, unspecified: Secondary | ICD-10-CM | POA: Insufficient documentation

## 2019-02-23 LAB — POCT URINALYSIS DIPSTICK
Bilirubin, UA: NEGATIVE
Blood, UA: NEGATIVE
Glucose, UA: NEGATIVE
Ketones, UA: NEGATIVE
Leukocytes, UA: NEGATIVE
Nitrite, UA: NEGATIVE
Protein, UA: NEGATIVE
Spec Grav, UA: 1.02 (ref 1.010–1.025)
Urobilinogen, UA: 0.2 E.U./dL
pH, UA: 7 (ref 5.0–8.0)

## 2019-02-23 MED ORDER — IBUPROFEN 800 MG PO TABS: 800.0000 mg | ORAL_TABLET | Freq: Three times a day (TID) | ORAL | 3 refills | Status: DC | PRN

## 2019-02-23 NOTE — Progress Notes (Signed)
[facility]   Established Patient Office Visit  Subjective:  Patient ID: Tina Huber, female    DOB: Apr 29, 1975  Age: 44 y.o. MRN: [id number]  CC:  Chief Complaint  Patient presents with  . Follow-up    Sickle Cell    HPI Tina Huber is a 44 year old female who presents for Follow Up today.   Past Medical History:  Diagnosis Date  . Chronic pain syndrome   . HIV (human immunodeficiency virus infection) (HCC)   . Rheumatoid arthritis (HCC)   . Sickle cell trait (HCC)    Current Status: Since her last office visit, she has c/o lower leg pain today. She states that she has taken prescribed pain medication only occasionally, as she does not like to take medication. She continues to follow up with Infection Disease as needed. Her anxiety is mild  today. She denies suicidal ideations, homicidal ideations, or auditory hallucinations. She denies fevers, chills, fatigue, recent infections, weight loss, and night sweats. She has not had any headaches, visual changes, dizziness, and falls. No chest pain, heart palpitations, cough and shortness of breath reported. No reports of GI problems such as nausea, vomiting, diarrhea, and constipation. She has no reports of blood in stools, dysuria and hematuria.   Past Surgical History:  Procedure Laterality Date  . OSTEOCHONDROMA EXCISION      Family History  Problem Relation Age of Onset  . Stroke Brother   . Diabetes Paternal Uncle     Social History   Socioeconomic History  . Marital status: Married    Spouse name: Not on file  . Number of children: Not on file  . Years of education: Not on file  . Highest education level: Not on file  Occupational History  . 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 Smoking status: Never Smoker  . Smokeless tobacco: Never Used  Substance and Sexual Activity  . Alcohol use: No    Alcohol/week: 0.0 standard drinks  . Drug use:  No  . Sexual activity: Yes    Partners: Male    Comment: No sex for 7 years   Other Topics Concern  . Not on file  Social History Narrative  . Not on file   Social Determinants of Health   Financial Resource Strain:   . Difficulty of Paying Living Expenses: Not on file  Food Insecurity:   . Worried About Programme researcher, broadcasting/film/video in the Last Year: Not on file  . Ran Out of Food in the Last Year: Not on file  Transportation Needs:   . Lack of Transportation (Medical): Not on file  . Lack of Transportation (Non-Medical): Not on file  Physical Activity:   . Days of Exercise per Week: Not on file  . Minutes of Exercise per Session: Not on file  Stress:   . Feeling of Stress : Not on file  Social Connections:   . Frequency of Communication with Friends and Family: Not on file  . Frequency of Social Gatherings with Friends and Family: Not on file  . Attends Religious Services: Not on file  . Active Member of Clubs or Organizations: Not on file  . Attends Banker Meetings: Not on file  . Marital Status: Not on file  Intimate Partner Violence:   . Fear of Current or Ex-Partner: Not on file  . Emotionally Abused: Not on file  . Physically Abused: Not on  file  . Sexually Abused: Not on file    Outpatient Medications Prior to Visit  Medication Sig Dispense Refill  . cyclobenzaprine (FLEXERIL) 10 MG tablet Take 1 tablet (10 mg total) by mouth at bedtime. 30 tablet 3  . diclofenac (VOLTAREN) 75 MG EC tablet Take 1 tablet (75 mg total) by mouth 2 (two) times daily. 60 tablet 6  . folic acid (FOLVITE) 1 MG tablet Take 1 mg by mouth daily.    Marland Kitchen gabapentin (NEURONTIN) 300 MG capsule Take 1 capsule (300 mg total) by mouth 3 (three) times daily. 90 capsule 3  . Multiple Vitamins-Minerals (MULTIVITAMIN WITH MINERALS) tablet Take 1 tablet by mouth daily.    Marland Kitchen POTASSIUM PO Take by mouth.    . Vitamin D, Ergocalciferol, (DRISDOL) 50000 units CAPS capsule Take 1 capsule (50,000 Units  total) by mouth every 7 (seven) days. 30 capsule 3   No facility-administered medications prior to visit.    Allergies  Allergen Reactions  . Latex     Gloves- made hands itch and burn  . Quinine Derivatives Itching    ROS Review of Systems  Constitutional: Negative.   HENT: Negative.   Eyes: Negative.   Respiratory: Negative.   Cardiovascular: Negative.   Gastrointestinal: Negative.   Endocrine: Negative.   Genitourinary: Negative.   Musculoskeletal: Positive for arthralgias (generalized ).  Skin: Negative.   Allergic/Immunologic: Negative.   Neurological: Negative.   Hematological: Negative.   Psychiatric/Behavioral: Negative.     Objective:    Physical Exam  Constitutional: She is oriented to person, place, and time. She appears well-developed and well-nourished.  HENT:  Head: Normocephalic and atraumatic.  Eyes: Conjunctivae are normal.  Cardiovascular: Normal rate, regular rhythm, normal heart sounds and intact distal pulses.  Pulmonary/Chest: Effort normal and breath sounds normal.  Abdominal: Soft. Bowel sounds are normal.  Musculoskeletal:        General: Normal range of motion.     Cervical back: Normal range of motion and neck supple.  Neurological: She is alert and oriented to person, place, and time. She has normal reflexes.  Peripheral neuropathy  Skin: Skin is warm and dry.  Psychiatric: She has a normal mood and affect. Her behavior is normal. Judgment and thought content normal.  Nursing note and vitals reviewed.   BP 125/76   Pulse (!) 105   Temp 97.9 F (36.6 C) (Oral)   Ht 5\' 1"  (1.549 m)   Wt 149 lb 6.4 oz (67.8 kg)   LMP 02/15/2019   SpO2 100%   BMI 28.23 kg/m  Wt Readings from Last 3 Encounters:  02/23/19 149 lb 6.4 oz (67.8 kg)  02/02/19 151 lb (68.5 kg)  10/08/18 156 lb 12.8 oz (71.1 kg)     Health Maintenance Due  Topic Date Due  . PAP SMEAR-Modifier  03/14/2018    There are no preventive care reminders to display for  this patient.  Lab Results  Component Value Date   TSH 1.010 09/08/2018   Lab Results  Component Value Date   WBC 5.3 09/08/2018   HGB 12.2 09/08/2018   HCT 35.4 09/08/2018   MCV 85 09/08/2018   PLT 270 09/08/2018   Lab Results  Component Value Date   NA 138 09/08/2018   K 3.9 09/08/2018   CO2 24 09/08/2018   GLUCOSE 80 09/08/2018   BUN 8 09/08/2018   CREATININE 0.68 09/08/2018   BILITOT 0.2 09/08/2018   ALKPHOS 81 09/08/2018   AST 22 09/08/2018  ALT 19 09/08/2018   PROT 7.8 09/08/2018   ALBUMIN 4.5 09/08/2018   CALCIUM 9.5 09/08/2018   ANIONGAP 11 02/19/2017   Lab Results  Component Value Date   CHOL 206 (H) 09/08/2018   Lab Results  Component Value Date   HDL 69 09/08/2018   Lab Results  Component Value Date   LDLCALC 126 (H) 09/08/2018   Lab Results  Component Value Date   TRIG 56 09/08/2018   Lab Results  Component Value Date   CHOLHDL 3.0 09/08/2018   No results found for: HGBA1C   Assessment & Plan:   1. Sickle cell trait syndrome (HCC) Increased fatigue.  - POCT urinalysis dipstick - Vitamin D, 25-hydroxy - Vitamin B12 - Magnesium - Iron and TIBC([facility])  1. Fatigue, unspecified type We will evaluate for mineral deficiencies today.  - Vitamin D, 25-hydroxy - Vitamin B12 - Magnesium - Iron and TIBC([facility]) - Potassium  3. Upper and lower extremity pain New Rx for Motrin sent to pharmacy today. She will also take other prescribed pain medications daily as directed to aide in her pain relief. Monitor.  - Vitamin D, 25-hydroxy - Vitamin B12 - Magnesium - Iron and TIBC([facility]) - Potassium - ibuprofen (ADVIL) 800 MG tablet; Take 1 tablet (800 mg total) by mouth every 8 (eight) hours as needed.  Dispense: 30 tablet; Refill: 3  4. Peripheral neuropathy Patient is encouraged to take Gabapentin daily as prescribed.   5. Follow up She will follow up in 6 months.   Meds ordered this encounter    Medications  . ibuprofen (ADVIL) 800 MG tablet    Sig: Take 1 tablet (800 mg total) by mouth every 8 (eight) hours as needed.    Dispense:  30 tablet    Refill:  3    Orders Placed This Encounter  Procedures  . Vitamin D, 25-hydroxy  . Vitamin B12  . Magnesium  . Iron and TIBC([facility])  . Potassium  . POCT urinalysis dipstick    Referral Orders  No referral(s) requested today    Kathe Becton,  MSN, FNP-BC [address] [place], [place] [address] [phone]- fax   Problem List Items Addressed This Visit    None    Visit Diagnoses    Sickle cell trait syndrome ([facility])    -  Primary   Relevant Orders   POCT urinalysis dipstick (Completed)   Vitamin D, 25-hydroxy   Vitamin B12   Magnesium   Iron and TIBC([facility])   Fatigue, unspecified type       Relevant Orders   Vitamin D, 25-hydroxy   Vitamin B12   Magnesium   Iron and TIBC([facility])   Potassium   Upper and lower extremity pain       Relevant Medications   ibuprofen (ADVIL) 800 MG tablet   Other Relevant Orders   Vitamin D, 25-hydroxy   Vitamin B12   Magnesium   Iron and TIBC([facility])   Potassium   Follow up          Meds ordered this encounter  Medications  . ibuprofen (ADVIL) 800 MG tablet    Sig: Take 1 tablet (800 mg total) by mouth every 8 (eight) hours as needed.    Dispense:  30 tablet    Refill:  3    Follow-up: No follow-ups on file.    Azzie Glatter, FNP

## 2019-02-23 NOTE — Patient Instructions (Signed)
Peripheral Neuropathy Peripheral neuropathy is a type of nerve damage. It affects nerves that carry signals between the spinal cord and the arms, legs, and the rest of the body (peripheral nerves). It does not affect nerves in the spinal cord or brain. In peripheral neuropathy, one nerve or a group of nerves may be damaged. Peripheral neuropathy is a broad category that includes many specific nerve disorders, like diabetic neuropathy, hereditary neuropathy, and carpal tunnel syndrome. What are the causes? This condition may be caused by:  Diabetes. This is the most common cause of peripheral neuropathy.  Nerve injury.  Pressure or stress on a nerve that lasts a long time.  Lack (deficiency) of B vitamins. This can result from alcoholism, poor diet, or a restricted diet.  Infections.  Autoimmune diseases, such as rheumatoid arthritis and systemic lupus erythematosus.  Nerve diseases that are passed from parent to child (inherited).  Some medicines, such as cancer medicines (chemotherapy).  Poisonous (toxic) substances, such as lead and mercury.  Too little blood flowing to the legs.  Kidney disease.  Thyroid disease. In some cases, the cause of this condition is not known. What are the signs or symptoms? Symptoms of this condition depend on which of your nerves is damaged. Common symptoms include:  Loss of feeling (numbness) in the feet, hands, or both.  Tingling in the feet, hands, or both.  Burning pain.  Very sensitive skin.  Weakness.  Not being able to move a part of the body (paralysis).  Muscle twitching.  Clumsiness or poor coordination.  Loss of balance.  Not being able to control your bladder.  Feeling dizzy.  Sexual problems. How is this diagnosed? Diagnosing and finding the cause of peripheral neuropathy can be difficult. Your health care provider will take your medical history and do a physical exam. A neurological exam will also be done. This  involves checking things that are affected by your brain, spinal cord, and nerves (nervous system). For example, your health care provider will check your reflexes, how you move, and what you can feel. You may have other tests, such as:  Blood tests.  Electromyogram (EMG) and nerve conduction tests. These tests check nerve function and how well the nerves are controlling the muscles.  Imaging tests, such as CT scans or MRI to rule out other causes of your symptoms.  Removing a small piece of nerve to be examined in a lab (nerve biopsy). This is rare.  Removing and examining a small amount of the fluid that surrounds the brain and spinal cord (lumbar puncture). This is rare. How is this treated? Treatment for this condition may involve:  Treating the underlying cause of the neuropathy, such as diabetes, kidney disease, or vitamin deficiencies.  Stopping medicines that can cause neuropathy, such as chemotherapy.  Medicine to relieve pain. Medicines may include: ? Prescription or over-the-counter pain medicine. ? Antiseizure medicine. ? Antidepressants. ? Pain-relieving patches that are applied to painful areas of skin.  Surgery to relieve pressure on a nerve or to destroy a nerve that is causing pain.  Physical therapy to help improve movement and balance.  Devices to help you move around (assistive devices). Follow these instructions at home: Medicines  Take over-the-counter and prescription medicines only as told by your health care provider. Do not take any other medicines without first asking your health care provider.  Do not drive or use heavy machinery while taking prescription pain medicine. Lifestyle   Do not use any products that contain nicotine   or tobacco, such as cigarettes and e-cigarettes. Smoking keeps blood from reaching damaged nerves. If you need help quitting, ask your health care provider.  Avoid or limit alcohol. Too much alcohol can cause a vitamin B  deficiency, and vitamin B is needed for healthy nerves.  Eat a healthy diet. This includes: ? Eating foods that are high in fiber, such as fresh fruits and vegetables, whole grains, and beans. ? Limiting foods that are high in fat and processed sugars, such as fried or sweet foods. General instructions   If you have diabetes, work closely with your health care provider to keep your blood sugar under control.  If you have numbness in your feet: ? Check every day for signs of injury or infection. Watch for redness, warmth, and swelling. ? Wear padded socks and comfortable shoes. These help protect your feet.  Develop a good support system. Living with peripheral neuropathy can be stressful. Consider talking with a mental health specialist or joining a support group.  Use assistive devices and attend physical therapy as told by your health care provider. This may include using a walker or a cane.  Keep all follow-up visits as told by your health care provider. This is important. Contact a health care provider if:  You have new signs or symptoms of peripheral neuropathy.  You are struggling emotionally from dealing with peripheral neuropathy.  Your pain is not well-controlled. Get help right away if:  You have an injury or infection that is not healing normally.  You develop new weakness in an arm or leg.  You fall frequently. Summary  Peripheral neuropathy is when the nerves in the arms, or legs are damaged, resulting in numbness, weakness, or pain.  There are many causes of peripheral neuropathy, including diabetes, pinched nerves, vitamin deficiencies, autoimmune disease, and hereditary conditions.  Diagnosing and finding the cause of peripheral neuropathy can be difficult. Your health care provider will take your medical history, do a physical exam, and do tests, including blood tests and nerve function tests.  Treatment involves treating the underlying cause of the  neuropathy and taking medicines to help control pain. Physical therapy and assistive devices may also help. This information is not intended to replace advice given to you by your health care provider. Make sure you discuss any questions you have with your health care provider. Document Revised: 12/28/2016 Document Reviewed: 03/26/2016 Elsevier Patient Education  2020 [employer] Ibuprofen tablets and capsules What is this medicine? IBUPROFEN (eye BYOO proe fen) is a non-steroidal anti-inflammatory drug (NSAID). It is used for dental pain, fever, headaches or migraines, osteoarthritis, rheumatoid arthritis, or painful monthly periods. It can also relieve minor aches and pains caused by a cold, flu, or sore throat. This medicine may be used for other purposes; ask your health care provider or pharmacist if you have questions. COMMON BRAND NAME(S): Advil, Advil Junior Strength, Advil Migraine, Genpril, Ibren, IBU, Ibupak, Midol, Midol Cramps and Body Aches, Motrin, Motrin IB, Motrin Junior Strength, Motrin Migraine Pain, Samson-8, Toxicology Saliva Collection What should I tell my health care provider before I take this medicine? They need to know if you have any of these conditions:  cigarette smoker  coronary artery bypass graft (CABG) surgery within the past 2 weeks  drink more than 3 alcohol-containing drinks a day  heart disease  high blood pressure  history of stomach bleeding  kidney disease  liver disease  lung or breathing disease, like asthma  an unusual or allergic reaction to  ibuprofen, aspirin, other NSAIDs, other medicines, foods, dyes, or preservatives  pregnant or trying to get pregnant  breast-feeding How should I use this medicine? Take this medicine by mouth with a glass of water. Follow the directions on the prescription label. Take this medicine with food if your stomach gets upset. Try to not lie down for at least 10 minutes after you take the medicine. Take  your medicine at regular intervals. Do not take your medicine more often than directed. A special MedGuide will be given to you by the pharmacist with each prescription and refill. Be sure to read this information carefully each time. Talk to your pediatrician regarding the use of this medicine in children. Special care may be needed. Overdosage: If you think you have taken too much of this medicine contact a poison control center or emergency room at once. NOTE: This medicine is only for you. Do not share this medicine with others. What if I miss a dose? If you miss a dose, take it as soon as you can. If it is almost time for your next dose, take only that dose. Do not take double or extra doses. What may interact with this medicine? Do not take this medicine with any of the following medications:  cidofovir  ketorolac  methotrexate  pemetrexed This medicine may also interact with the following medications:  alcohol  aspirin  diuretics  lithium  other drugs for inflammation like prednisone  warfarin This list may not describe all possible interactions. Give your health care provider a list of all the medicines, herbs, non-prescription drugs, or dietary supplements you use. Also tell them if you smoke, drink alcohol, or use illegal drugs. Some items may interact with your medicine. What should I watch for while using this medicine? Tell your doctor or healthcare provider if your symptoms do not start to get better or if they get worse. This medicine may cause serious skin reactions. They can happen weeks to months after starting the medicine. Contact your healthcare provider right away if you notice fevers or flu-like symptoms with a rash. The rash may be red or purple and then turn into blisters or peeling of the skin. Or, you might notice a red rash with swelling of the face, lips or lymph nodes in your neck or under your arms. This medicine does not prevent heart attack or  stroke. In fact, this medicine may increase the chance of a heart attack or stroke. The chance may increase with longer use of this medicine and in people who have heart disease. If you take aspirin to prevent heart attack or stroke, talk with your doctor or healthcare provider. Do not take other medicines that contain aspirin, ibuprofen, or naproxen with this medicine. Side effects such as stomach upset, nausea, or ulcers may be more likely to occur. Many medicines available without a prescription should not be taken with this medicine. This medicine can cause ulcers and bleeding in the stomach and intestines at any time during treatment. Ulcers and bleeding can happen without warning symptoms and can cause death. To reduce your risk, do not smoke cigarettes or drink alcohol while you are taking this medicine. You may get drowsy or dizzy. Do not drive, use machinery, or do anything that needs mental alertness until you know how this medicine affects you. Do not stand or sit up quickly, especially if you are an older patient. This reduces the risk of dizzy or fainting spells. This medicine can cause you to bleed  more easily. Try to avoid damage to your teeth and gums when you brush or floss your teeth. This medicine may be used to treat migraines. If you take migraine medicines for 10 or more days a month, your migraines may get worse. Keep a diary of headache days and medicine use. Contact your healthcare provider if your migraine attacks occur more frequently. What side effects may I notice from receiving this medicine? Side effects that you should report to your doctor or health care professional as soon as possible:  allergic reactions like skin rash, itching or hives, swelling of the face, lips, or tongue  redness, blistering, peeling or loosening of the skin, including inside the mouth  severe stomach pain  signs and symptoms of bleeding such as bloody or black, tarry stools; red or dark-brown  urine; spitting up blood or brown material that looks like coffee grounds; red spots on the skin; unusual bruising or bleeding from the eye, gums, or nose  signs and symptoms of a blood clot such as changes in vision; chest pain; severe, sudden headache; trouble speaking; sudden numbness or weakness of the face, arm, or leg  unexplained weight gain or swelling  unusually weak or tired  yellowing of eyes or skin Side effects that usually do not require medical attention (report to your doctor or health care professional if they continue or are bothersome):  bruising  diarrhea  dizziness, drowsiness  headache  nausea, vomiting This list may not describe all possible side effects. Call your doctor for medical advice about side effects. You may report side effects to FDA at 1-800-FDA-1088. Where should I keep my medicine? Keep out of the reach of children. Store at room temperature between 15 and 30 degrees C (59 and 86 degrees F). Keep container tightly closed. Throw away any unused medicine after the expiration date. NOTE: This sheet is a summary. It may not cover all possible information. If you have questions about this medicine, talk to your doctor, pharmacist, or health care provider.  2020 Elsevier/Gold Standard (2018-04-02 14:11:00)

## 2019-02-24 LAB — VITAMIN B12: Vitamin B-12: 1014 pg/mL (ref 232–1245)

## 2019-02-24 LAB — POTASSIUM: Potassium: 4.2 mmol/L (ref 3.5–5.2)

## 2019-02-24 LAB — VITAMIN D 25 HYDROXY (VIT D DEFICIENCY, FRACTURES): Vit D, 25-Hydroxy: 72.2 ng/mL (ref 30.0–100.0)

## 2019-02-24 LAB — IRON AND TIBC
Iron Saturation: 31 % (ref 15–55)
Iron: 70 ug/dL (ref 27–159)
Total Iron Binding Capacity: 227 ug/dL — ABNORMAL LOW (ref 250–450)
UIBC: 157 ug/dL (ref 131–425)

## 2019-02-24 LAB — MAGNESIUM: Magnesium: 2.3 mg/dL (ref 1.6–2.3)

## 2019-03-10 ENCOUNTER — Other Ambulatory Visit: Payer: Self-pay | Admitting: Family Medicine

## 2019-03-10 NOTE — Telephone Encounter (Signed)
Refill request for Gabapentin 300mg

## 2019-04-17 ENCOUNTER — Other Ambulatory Visit: Payer: Self-pay | Admitting: Family Medicine

## 2019-04-17 DIAGNOSIS — M62838 Other muscle spasm: Secondary | ICD-10-CM

## 2019-04-17 DIAGNOSIS — G8929 Other chronic pain: Secondary | ICD-10-CM

## 2019-04-17 DIAGNOSIS — M545 Low back pain, unspecified: Secondary | ICD-10-CM

## 2019-06-30 DIAGNOSIS — F4310 Post-traumatic stress disorder, unspecified: Secondary | ICD-10-CM

## 2019-06-30 HISTORY — DX: Post-traumatic stress disorder, unspecified: F43.10

## 2019-07-14 DIAGNOSIS — M549 Dorsalgia, unspecified: Secondary | ICD-10-CM

## 2019-07-14 HISTORY — DX: Dorsalgia, unspecified: M54.9

## 2019-07-15 ENCOUNTER — Telehealth: Payer: Self-pay | Admitting: Internal Medicine
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
## 2019-07-16 NOTE — Telephone Encounter (Signed)
Complete. Pt states they will wait until their appt on 6/25.

## 2019-07-24 ENCOUNTER — Ambulatory Visit: Payer: Managed Care, Other (non HMO) | Admitting: Family Medicine

## 2019-07-24 ENCOUNTER — Encounter: Payer: Self-pay | Admitting: Family Medicine

## 2019-07-24 ENCOUNTER — Other Ambulatory Visit: Payer: Self-pay

## 2019-07-24 VITALS — BP 121/81 | HR 97 | Temp 98.9°F | Wt 154.0 lb

## 2019-07-24 DIAGNOSIS — Z0000 Encounter for general adult medical examination without abnormal findings: Secondary | ICD-10-CM

## 2019-07-24 DIAGNOSIS — M549 Dorsalgia, unspecified: Secondary | ICD-10-CM | POA: Diagnosis not present

## 2019-07-24 DIAGNOSIS — G8929 Other chronic pain: Secondary | ICD-10-CM

## 2019-07-24 DIAGNOSIS — M545 Low back pain, unspecified: Secondary | ICD-10-CM

## 2019-07-24 DIAGNOSIS — Z09 Encounter for follow-up examination after completed treatment for conditions other than malignant neoplasm: Secondary | ICD-10-CM

## 2019-07-24 DIAGNOSIS — D573 Sickle-cell trait: Secondary | ICD-10-CM

## 2019-07-24 LAB — POCT URINALYSIS DIPSTICK
Bilirubin, UA: NEGATIVE
Blood, UA: NEGATIVE
Glucose, UA: NEGATIVE
Ketones, UA: NEGATIVE
Leukocytes, UA: NEGATIVE
Nitrite, UA: NEGATIVE
Protein, UA: NEGATIVE
Spec Grav, UA: 1.02 (ref 1.010–1.025)
Urobilinogen, UA: 1 E.U./dL
pH, UA: 7.5 (ref 5.0–8.0)

## 2019-07-24 MED ORDER — OXYCODONE-ACETAMINOPHEN 5-325 MG PO TABS: 1.0000 | ORAL_TABLET | Freq: Three times a day (TID) | ORAL | 0 refills | Status: DC | PRN
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
## 2019-07-25 LAB — CBC WITH DIFFERENTIAL/PLATELET
Basophils Absolute: 0 10*3/uL (ref 0.0–0.2)
Basos: 0 %
EOS (ABSOLUTE): 0.1 10*3/uL (ref 0.0–0.4)
Eos: 3 %
Hematocrit: 36.8 % (ref 34.0–46.6)
Hemoglobin: 12.6 g/dL (ref 11.1–15.9)
Immature Grans (Abs): 0 10*3/uL (ref 0.0–0.1)
Immature Granulocytes: 0 %
Lymphocytes Absolute: 1.4 10*3/uL (ref 0.7–3.1)
Lymphs: 43 %
MCH: 30.4 pg (ref 26.6–33.0)
MCHC: 34.2 g/dL (ref 31.5–35.7)
MCV: 89 fL (ref 79–97)
Monocytes Absolute: 0.5 10*3/uL (ref 0.1–0.9)
Monocytes: 14 %
Neutrophils Absolute: 1.4 10*3/uL (ref 1.4–7.0)
Neutrophils: 40 %
Platelets: 243 10*3/uL (ref 150–450)
RBC: 4.14 x10E6/uL (ref 3.77–5.28)
RDW: 14.2 % (ref 11.7–15.4)
WBC: 3.4 10*3/uL (ref 3.4–10.8)

## 2019-07-25 LAB — COMPREHENSIVE METABOLIC PANEL
ALT: 14 IU/L (ref 0–32)
AST: 21 IU/L (ref 0–40)
Albumin/Globulin Ratio: 1.1 — ABNORMAL LOW (ref 1.2–2.2)
Albumin: 4 g/dL (ref 3.8–4.8)
Alkaline Phosphatase: 61 IU/L (ref 48–121)
BUN/Creatinine Ratio: 9 (ref 9–23)
BUN: 7 mg/dL (ref 6–24)
Bilirubin Total: 0.4 mg/dL (ref 0.0–1.2)
CO2: 23 mmol/L (ref 20–29)
Calcium: 9.1 mg/dL (ref 8.7–10.2)
Chloride: 100 mmol/L (ref 96–106)
Creatinine, Ser: 0.79 mg/dL (ref 0.57–1.00)
GFR calc Af Amer: 107 mL/min/{1.73_m2} (ref >59)
GFR calc non Af Amer: 93 mL/min/{1.73_m2} (ref >59)
Globulin, Total: 3.5 g/dL (ref 1.5–4.5)
Glucose: 106 mg/dL — ABNORMAL HIGH (ref 65–99)
Potassium: 4.2 mmol/L (ref 3.5–5.2)
Sodium: 136 mmol/L (ref 134–144)
Total Protein: 7.5 g/dL (ref 6.0–8.5)

## 2019-07-25 LAB — LIPID PANEL
Chol/HDL Ratio: 3.1 ratio (ref 0.0–4.4)
Cholesterol, Total: 189 mg/dL (ref 100–199)
HDL: 61 mg/dL (ref >39)
LDL Chol Calc (NIH): 116 mg/dL — ABNORMAL HIGH (ref 0–99)
Triglycerides: 64 mg/dL (ref 0–149)
VLDL Cholesterol Cal: 12 mg/dL (ref 5–40)

## 2019-07-25 LAB — TSH: TSH: 0.39 u[IU]/mL — ABNORMAL LOW (ref 0.450–4.500)

## 2019-07-25 LAB — HEMOGLOBIN A1C
Est. average glucose Bld gHb Est-mCnc: 120 mg/dL
Hgb A1c MFr Bld: 5.8 % — ABNORMAL HIGH (ref 4.8–5.6)

## 2019-07-25 LAB — VITAMIN D 25 HYDROXY (VIT D DEFICIENCY, FRACTURES): Vit D, 25-Hydroxy: 58.9 ng/mL (ref 30.0–100.0)

## 2019-07-25 LAB — VITAMIN B12: Vitamin B-12: 1167 pg/mL (ref 232–1245)

## 2019-07-28 ENCOUNTER — Encounter: Payer: Self-pay | Admitting: Family Medicine

## 2019-07-28 DIAGNOSIS — M549 Dorsalgia, unspecified: Secondary | ICD-10-CM | POA: Insufficient documentation

## 2019-08-06 ENCOUNTER — Other Ambulatory Visit: Payer: Self-pay | Admitting: Family Medicine

## 2019-08-06 DIAGNOSIS — R7989 Other specified abnormal findings of blood chemistry: Secondary | ICD-10-CM

## 2019-08-13 ENCOUNTER — Other Ambulatory Visit: Payer: Managed Care, Other (non HMO)

## 2019-08-13 ENCOUNTER — Other Ambulatory Visit (HOSPITAL_COMMUNITY)
Admission: RE | Admit: 2019-08-13 | Discharge: 2019-08-13 | Disposition: A | Discharge status: Home / Self Care | Payer: Managed Care, Other (non HMO) | Source: Ambulatory Visit | Attending: Internal Medicine | Admitting: Internal Medicine

## 2019-08-13 ENCOUNTER — Other Ambulatory Visit: Payer: Self-pay

## 2019-08-13 DIAGNOSIS — B20 Human immunodeficiency virus [HIV] disease: Secondary | ICD-10-CM

## 2019-08-13 DIAGNOSIS — Z113 Encounter for screening for infections with a predominantly sexual mode of transmission: Secondary | ICD-10-CM

## 2019-08-14 LAB — T-HELPER CELL (CD4) - (RCID CLINIC ONLY)
CD4 % Helper T Cell: 40 % (ref 33–65)
CD4 T Cell Abs: 1025 /uL (ref 400–1790)

## 2019-08-15 LAB — HIV-1 RNA QUANT-NO REFLEX-BLD
HIV 1 RNA Quant: 26 copies/mL — ABNORMAL HIGH
HIV-1 RNA Quant, Log: 1.41 Log copies/mL — ABNORMAL HIGH

## 2019-08-15 LAB — RPR: RPR Ser Ql: NONREACTIVE

## 2019-08-17 ENCOUNTER — Other Ambulatory Visit: Payer: Managed Care, Other (non HMO)

## 2019-08-17 LAB — URINE CYTOLOGY ANCILLARY ONLY
Chlamydia: NEGATIVE
Comment: NEGATIVE
Comment: NORMAL
Neisseria Gonorrhea: NEGATIVE

## 2019-08-21 ENCOUNTER — Ambulatory Visit (INDEPENDENT_AMBULATORY_CARE_PROVIDER_SITE_OTHER): Payer: Managed Care, Other (non HMO) | Admitting: Family Medicine

## 2019-08-21 ENCOUNTER — Encounter: Payer: Self-pay | Admitting: Family Medicine

## 2019-08-21 ENCOUNTER — Telehealth: Payer: Self-pay | Admitting: Family Medicine

## 2019-08-21 ENCOUNTER — Ambulatory Visit (HOSPITAL_COMMUNITY)
Admission: RE | Admit: 2019-08-21 | Discharge: 2019-08-21 | Disposition: A | Discharge status: Home / Self Care | Payer: Managed Care, Other (non HMO) | Source: Ambulatory Visit | Attending: Family Medicine | Admitting: Family Medicine

## 2019-08-21 ENCOUNTER — Other Ambulatory Visit: Payer: Self-pay

## 2019-08-21 VITALS — BP 131/91 | HR 103 | Temp 97.1°F | Resp 20 | Ht <= 58 in | Wt 153.6 lb

## 2019-08-21 DIAGNOSIS — R7611 Nonspecific reaction to tuberculin skin test without active tuberculosis: Secondary | ICD-10-CM | POA: Insufficient documentation

## 2019-08-21 DIAGNOSIS — M545 Low back pain, unspecified: Secondary | ICD-10-CM

## 2019-08-21 DIAGNOSIS — G8929 Other chronic pain: Secondary | ICD-10-CM

## 2019-08-21 DIAGNOSIS — Z0000 Encounter for general adult medical examination without abnormal findings: Secondary | ICD-10-CM

## 2019-08-21 DIAGNOSIS — Z09 Encounter for follow-up examination after completed treatment for conditions other than malignant neoplasm: Secondary | ICD-10-CM

## 2019-08-21 DIAGNOSIS — D573 Sickle-cell trait: Secondary | ICD-10-CM

## 2019-08-21 NOTE — Telephone Encounter (Signed)
Sent to Np °

## 2019-08-31 ENCOUNTER — Encounter: Payer: Self-pay | Admitting: Internal Medicine

## 2019-08-31 ENCOUNTER — Other Ambulatory Visit: Payer: Self-pay

## 2019-08-31 ENCOUNTER — Ambulatory Visit: Payer: Managed Care, Other (non HMO) | Admitting: Internal Medicine

## 2019-08-31 VITALS — BP 139/101 | HR 103 | Temp 98.4°F | Wt 155.0 lb

## 2019-08-31 DIAGNOSIS — M79621 Pain in right upper arm: Secondary | ICD-10-CM | POA: Diagnosis not present

## 2019-08-31 DIAGNOSIS — M7989 Other specified soft tissue disorders: Secondary | ICD-10-CM

## 2019-08-31 DIAGNOSIS — Z113 Encounter for screening for infections with a predominantly sexual mode of transmission: Secondary | ICD-10-CM | POA: Diagnosis not present

## 2019-08-31 DIAGNOSIS — B20 Human immunodeficiency virus [HIV] disease: Secondary | ICD-10-CM

## 2019-08-31 DIAGNOSIS — Z23 Encounter for immunization: Secondary | ICD-10-CM | POA: Diagnosis not present

## 2019-08-31 NOTE — Assessment & Plan Note (Signed)
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
## 2019-08-31 NOTE — Progress Notes (Signed)
   Subjective:    Patient ID: Tina Huber, female    DOB: Apr 16, 1975, 44 y.o.   MRN: [id number]  HPI Here for follow up of HIV. She is a long-term non-progressor with a CD4 of 1025 and viral load of just 26 copies.  Chronic issues with pain. Complaint of bilateral hand swelling and going to her rheumatologist, Dr. Corliss Skains.  Has remained off of antiretrovirals.    Review of Systems  Constitutional: Negative for fatigue and unexpected weight change.  Skin: Negative for rash.       Objective:   Physical Exam Constitutional:      General: She is not in acute distress.    Appearance: She is well-developed.  Eyes:     General: No scleral icterus. Pulmonary:     Effort: Pulmonary effort is normal. No respiratory distress.  Skin:    Findings: No rash.  Psychiatric:        Mood and Affect: Mood normal.    SH: no tobacco       Assessment & Plan:

## 2019-08-31 NOTE — Assessment & Plan Note (Signed)
She continues to do well and no issues related to HIV.  I again have encouraged her to consider starting ARVs due to her other issues such as peripheral neuropathy and see if she has some response to her chronic issues.  She though continues to be hesitant since her viral load remains nearly suppressed without medication.   Will continue to monitor. rtc 6 months

## 2019-08-31 NOTE — Assessment & Plan Note (Signed)
I again discussed the possibility of her issues being exacerbated by the little bit of circulating HIV.  She will continue to think about starting ARVs.

## 2019-09-01 LAB — MEASLES/MUMPS/RUBELLA IMMUNITY
Mumps IgG: 197 AU/mL
Rubella: 4.05 Index
Rubeola IgG: >300 AU/mL

## 2019-09-09 ENCOUNTER — Telehealth: Payer: Self-pay | Admitting: Family Medicine

## 2019-09-09 NOTE — Telephone Encounter (Signed)
error 

## 2019-09-30 ENCOUNTER — Other Ambulatory Visit: Payer: Managed Care, Other (non HMO)

## 2019-10-02 ENCOUNTER — Other Ambulatory Visit: Payer: Managed Care, Other (non HMO)

## 2019-10-02 ENCOUNTER — Other Ambulatory Visit: Payer: Self-pay

## 2019-10-02 DIAGNOSIS — Z113 Encounter for screening for infections with a predominantly sexual mode of transmission: Secondary | ICD-10-CM

## 2019-10-02 DIAGNOSIS — R7989 Other specified abnormal findings of blood chemistry: Secondary | ICD-10-CM

## 2019-10-02 DIAGNOSIS — B20 Human immunodeficiency virus [HIV] disease: Secondary | ICD-10-CM

## 2019-10-03 ENCOUNTER — Other Ambulatory Visit: Payer: Self-pay | Admitting: Family Medicine

## 2019-10-03 DIAGNOSIS — M549 Dorsalgia, unspecified: Secondary | ICD-10-CM

## 2019-10-03 LAB — THYROID PANEL WITH TSH
Free Thyroxine Index: 1.6 (ref 1.2–4.9)
T3 Uptake Ratio: 27 % (ref 24–39)
T4, Total: 5.9 ug/dL (ref 4.5–12.0)
TSH: 0.418 u[IU]/mL — ABNORMAL LOW (ref 0.450–4.500)

## 2019-10-03 LAB — T4, FREE: Free T4: 1.05 ng/dL (ref 0.82–1.77)

## 2019-10-03 LAB — T3, FREE: T3, Free: 2.8 pg/mL (ref 2.0–4.4)

## 2019-10-04 ENCOUNTER — Emergency Department (HOSPITAL_COMMUNITY)
Admission: EM | Admit: 2019-10-04 | Discharge: 2019-10-04 | Discharge status: Against Medical Advice | Payer: Managed Care, Other (non HMO)

## 2019-10-04 ENCOUNTER — Emergency Department (HOSPITAL_BASED_OUTPATIENT_CLINIC_OR_DEPARTMENT_OTHER)
Admission: EM | Admit: 2019-10-04 | Discharge: 2019-10-04 | Disposition: A | Discharge status: Home / Self Care | Payer: Managed Care, Other (non HMO) | Attending: Emergency Medicine | Admitting: Emergency Medicine

## 2019-10-04 ENCOUNTER — Emergency Department (HOSPITAL_BASED_OUTPATIENT_CLINIC_OR_DEPARTMENT_OTHER): Payer: Managed Care, Other (non HMO)

## 2019-10-04 ENCOUNTER — Encounter (HOSPITAL_BASED_OUTPATIENT_CLINIC_OR_DEPARTMENT_OTHER): Payer: Self-pay

## 2019-10-04 ENCOUNTER — Other Ambulatory Visit: Payer: Self-pay

## 2019-10-04 DIAGNOSIS — M79604 Pain in right leg: Secondary | ICD-10-CM | POA: Insufficient documentation

## 2019-10-04 DIAGNOSIS — Z91040 Latex allergy status: Secondary | ICD-10-CM | POA: Diagnosis not present

## 2019-10-04 DIAGNOSIS — M79605 Pain in left leg: Secondary | ICD-10-CM | POA: Insufficient documentation

## 2019-10-04 DIAGNOSIS — Z79899 Other long term (current) drug therapy: Secondary | ICD-10-CM | POA: Insufficient documentation

## 2019-10-04 LAB — COMPREHENSIVE METABOLIC PANEL
ALT: 18 U/L (ref 0–44)
AST: 21 U/L (ref 15–41)
Albumin: 3.9 g/dL (ref 3.5–5.0)
Alkaline Phosphatase: 53 U/L (ref 38–126)
Anion gap: 9 (ref 5–15)
BUN: 11 mg/dL (ref 6–20)
CO2: 26 mmol/L (ref 22–32)
Calcium: 8.5 mg/dL — ABNORMAL LOW (ref 8.9–10.3)
Chloride: 100 mmol/L (ref 98–111)
Creatinine, Ser: 0.74 mg/dL (ref 0.44–1.00)
GFR calc Af Amer: >60 mL/min (ref >60)
GFR calc non Af Amer: >60 mL/min (ref >60)
Glucose, Bld: 117 mg/dL — ABNORMAL HIGH (ref 70–99)
Potassium: 3.6 mmol/L (ref 3.5–5.1)
Sodium: 135 mmol/L (ref 135–145)
Total Bilirubin: 0.2 mg/dL — ABNORMAL LOW (ref 0.3–1.2)
Total Protein: 7.8 g/dL (ref 6.5–8.1)

## 2019-10-04 LAB — CBC WITH DIFFERENTIAL/PLATELET
Abs Immature Granulocytes: 0.01 10*3/uL (ref 0.00–0.07)
Basophils Absolute: 0 10*3/uL (ref 0.0–0.1)
Basophils Relative: 0 %
Eosinophils Absolute: 0.1 10*3/uL (ref 0.0–0.5)
Eosinophils Relative: 2 %
HCT: 37 % (ref 36.0–46.0)
Hemoglobin: 12.5 g/dL (ref 12.0–15.0)
Immature Granulocytes: 0 %
Lymphocytes Relative: 48 %
Lymphs Abs: 2.2 10*3/uL (ref 0.7–4.0)
MCH: 29.8 pg (ref 26.0–34.0)
MCHC: 33.8 g/dL (ref 30.0–36.0)
MCV: 88.3 fL (ref 80.0–100.0)
Monocytes Absolute: 0.5 10*3/uL (ref 0.1–1.0)
Monocytes Relative: 10 %
Neutro Abs: 1.9 10*3/uL (ref 1.7–7.7)
Neutrophils Relative %: 40 %
Platelets: 241 10*3/uL (ref 150–400)
RBC: 4.19 MIL/uL (ref 3.87–5.11)
RDW: 13.4 % (ref 11.5–15.5)
WBC: 4.6 10*3/uL (ref 4.0–10.5)
nRBC: 0 % (ref 0.0–0.2)

## 2019-10-04 MED ORDER — OXYCODONE HCL 5 MG PO TABS
5.0000 mg | ORAL_TABLET | Freq: Once | ORAL | Status: AC
  Administered 2019-10-04: 5 mg via ORAL
  Filled 2019-10-04: qty 1

## 2019-10-04 MED ORDER — CYCLOBENZAPRINE HCL 10 MG PO TABS: 10.0000 mg | ORAL_TABLET | Freq: Two times a day (BID) | ORAL | 0 refills | Status: DC | PRN

## 2019-10-04 NOTE — ED Triage Notes (Signed)
Pt states feels like exacerbation of sickle cell crisis, all over body pain, extremities, legs, abdomen.  Waited in [facility] for 5 hours, not able to be seen.  Takes gabapentin.  Has no pain management plan for at home.

## 2019-10-04 NOTE — ED Notes (Signed)
ED Provider at bedside. 
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
## 2019-10-09 ENCOUNTER — Other Ambulatory Visit: Payer: Self-pay | Admitting: Family Medicine

## 2019-10-23 ENCOUNTER — Ambulatory Visit (INDEPENDENT_AMBULATORY_CARE_PROVIDER_SITE_OTHER): Payer: Managed Care, Other (non HMO) | Admitting: Family Medicine

## 2019-10-23 ENCOUNTER — Encounter: Payer: Self-pay | Admitting: Family Medicine

## 2019-10-23 ENCOUNTER — Other Ambulatory Visit: Payer: Self-pay

## 2019-10-23 VITALS — BP 121/85 | HR 100 | Temp 98.4°F | Resp 18 | Ht 60.0 in | Wt 154.4 lb

## 2019-10-23 DIAGNOSIS — M62838 Other muscle spasm: Secondary | ICD-10-CM

## 2019-10-23 DIAGNOSIS — Z09 Encounter for follow-up examination after completed treatment for conditions other than malignant neoplasm: Secondary | ICD-10-CM

## 2019-10-23 DIAGNOSIS — R7611 Nonspecific reaction to tuberculin skin test without active tuberculosis: Secondary | ICD-10-CM | POA: Diagnosis not present

## 2019-10-23 DIAGNOSIS — Z23 Encounter for immunization: Secondary | ICD-10-CM | POA: Diagnosis not present

## 2019-10-23 DIAGNOSIS — D573 Sickle-cell trait: Secondary | ICD-10-CM

## 2019-10-23 DIAGNOSIS — M549 Dorsalgia, unspecified: Secondary | ICD-10-CM

## 2019-10-23 DIAGNOSIS — B20 Human immunodeficiency virus [HIV] disease: Secondary | ICD-10-CM

## 2019-10-23 DIAGNOSIS — G8929 Other chronic pain: Secondary | ICD-10-CM

## 2019-10-23 DIAGNOSIS — Z0000 Encounter for general adult medical examination without abnormal findings: Secondary | ICD-10-CM
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
## 2019-10-26 ENCOUNTER — Encounter: Payer: Self-pay | Admitting: Family Medicine

## 2019-10-30 ENCOUNTER — Other Ambulatory Visit: Payer: Self-pay | Admitting: Family Medicine

## 2019-10-30 ENCOUNTER — Telehealth: Payer: Self-pay | Admitting: Family Medicine

## 2019-10-30 NOTE — Telephone Encounter (Signed)
Pt called concerning a note for school. States that she discussed it with you during her 9/24 appt. Please let her know when it's ready for pick up

## 2019-11-25 ENCOUNTER — Encounter: Payer: Self-pay | Admitting: Family Medicine

## 2019-12-03 ENCOUNTER — Other Ambulatory Visit: Payer: Self-pay | Admitting: Family Medicine

## 2019-12-03 DIAGNOSIS — M62838 Other muscle spasm: Secondary | ICD-10-CM

## 2019-12-03 MED ORDER — GABAPENTIN 300 MG PO CAPS: 300.0000 mg | ORAL_CAPSULE | Freq: Three times a day (TID) | ORAL | 6 refills | Status: DC

## 2019-12-03 MED ORDER — DICLOFENAC SODIUM 75 MG PO TBEC: 75.0000 mg | DELAYED_RELEASE_TABLET | Freq: Two times a day (BID) | ORAL | 6 refills | Status: DC

## 2019-12-03 MED ORDER — CYCLOBENZAPRINE HCL 10 MG PO TABS: 10.0000 mg | ORAL_TABLET | Freq: Three times a day (TID) | ORAL | 6 refills | Status: DC | PRN

## 2020-01-19 ENCOUNTER — Other Ambulatory Visit: Payer: Self-pay

## 2020-01-19 ENCOUNTER — Ambulatory Visit (INDEPENDENT_AMBULATORY_CARE_PROVIDER_SITE_OTHER): Payer: Managed Care, Other (non HMO) | Admitting: Family Medicine

## 2020-01-19 ENCOUNTER — Encounter: Payer: Self-pay | Admitting: Family Medicine

## 2020-01-19 VITALS — BP 125/86 | HR 106 | Temp 98.4°F | Ht 60.0 in | Wt 156.8 lb

## 2020-01-19 DIAGNOSIS — M549 Dorsalgia, unspecified: Secondary | ICD-10-CM | POA: Diagnosis not present

## 2020-01-19 DIAGNOSIS — D573 Sickle-cell trait: Secondary | ICD-10-CM

## 2020-01-19 DIAGNOSIS — M62838 Other muscle spasm: Secondary | ICD-10-CM | POA: Diagnosis not present

## 2020-01-19 DIAGNOSIS — M545 Low back pain, unspecified: Secondary | ICD-10-CM

## 2020-01-19 DIAGNOSIS — B20 Human immunodeficiency virus [HIV] disease: Secondary | ICD-10-CM

## 2020-01-19 DIAGNOSIS — Z09 Encounter for follow-up examination after completed treatment for conditions other than malignant neoplasm: Secondary | ICD-10-CM

## 2020-01-19 DIAGNOSIS — G8929 Other chronic pain: Secondary | ICD-10-CM

## 2020-01-19 MED ORDER — TRAMADOL HCL 50 MG PO TABS: 50.0000 mg | ORAL_TABLET | Freq: Three times a day (TID) | ORAL | 0 refills | Status: AC | PRN
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
## 2020-01-26 ENCOUNTER — Encounter: Payer: Self-pay | Admitting: Family Medicine

## 2020-03-02 ENCOUNTER — Other Ambulatory Visit: Payer: Self-pay

## 2020-03-02 ENCOUNTER — Telehealth: Payer: Self-pay

## 2020-03-02 ENCOUNTER — Other Ambulatory Visit: Payer: Managed Care, Other (non HMO)

## 2020-03-02 DIAGNOSIS — B20 Human immunodeficiency virus [HIV] disease: Secondary | ICD-10-CM

## 2020-03-02 NOTE — Telephone Encounter (Signed)
-----   Message from Gardiner Barefoot, MD sent at 03/02/2020  4:03 PM EST ----- Regarding: RE: labs Yes, ok to draw the potassium. thanks ----- Message ----- From: Valarie Cones, LPN Sent: 09/01/6657   2:59 PM EST To: Gardiner Barefoot, MD Subject: labs                                           Patient was in the office earlier for labs today and requested potassium level to be drawn. Lab went ahead and obtained lab, just need verbal order. Thanks

## 2020-03-03 LAB — T-HELPER CELL (CD4) - (RCID CLINIC ONLY)
CD4 % Helper T Cell: 36 % (ref 33–65)
CD4 T Cell Abs: 740 /uL (ref 400–1790)

## 2020-03-03 LAB — POTASSIUM: Potassium: 3.9 mmol/L (ref 3.5–5.3)

## 2020-03-04 LAB — HIV-1 RNA QUANT-NO REFLEX-BLD
HIV 1 RNA Quant: 40 Copies/mL — ABNORMAL HIGH
HIV-1 RNA Quant, Log: 1.6 Log cps/mL — ABNORMAL HIGH

## 2020-03-16 ENCOUNTER — Encounter: Payer: Managed Care, Other (non HMO) | Admitting: Internal Medicine

## 2020-03-22 ENCOUNTER — Encounter: Payer: Managed Care, Other (non HMO) | Admitting: Internal Medicine

## 2020-04-01 ENCOUNTER — Other Ambulatory Visit: Payer: Self-pay

## 2020-04-01 ENCOUNTER — Ambulatory Visit: Payer: Managed Care, Other (non HMO) | Admitting: Internal Medicine

## 2020-04-01 ENCOUNTER — Encounter: Payer: Self-pay | Admitting: Internal Medicine

## 2020-04-01 VITALS — BP 154/103 | HR 102 | Temp 98.4°F | Ht 65.0 in | Wt 157.0 lb

## 2020-04-01 DIAGNOSIS — Z23 Encounter for immunization: Secondary | ICD-10-CM | POA: Diagnosis not present

## 2020-04-01 DIAGNOSIS — Z113 Encounter for screening for infections with a predominantly sexual mode of transmission: Secondary | ICD-10-CM | POA: Diagnosis not present

## 2020-04-01 DIAGNOSIS — B20 Human immunodeficiency virus [HIV] disease: Secondary | ICD-10-CM

## 2020-04-01 DIAGNOSIS — Z124 Encounter for screening for malignant neoplasm of cervix: Secondary | ICD-10-CM

## 2020-04-01 DIAGNOSIS — I10 Essential (primary) hypertension: Secondary | ICD-10-CM | POA: Insufficient documentation

## 2020-04-01 NOTE — Progress Notes (Signed)
   Covid-19 Vaccination Clinic  Name:  Emmogene Simson    MRN: [id number] DOB: 06/18/1975  04/01/2020  Ms. Davenport was observed post Covid-19 immunization for 15 minutes without incident. She was provided with Vaccine Information Sheet and instruction to access the V-Safe system.   Ms. Henzler was instructed to call 911 with any severe reactions post vaccine: Marland Kitchen Difficulty breathing  . Swelling of face and throat  . A fast heartbeat  . A bad rash all over body  . Dizziness and weakness

## 2020-04-01 NOTE — Progress Notes (Signed)
   Subjective:    Patient ID: Tina Huber, female    DOB: Oct 16, 1975, 45 y.o.   MRN: [id number]  HPI Here for follow up of HIV She is a long term non-progressor with continued good CD4 count from 600-1000s and viral load ranging from 20s to 40s without ARVs.  I have encouraged ARVs but she has remained reluctant.  She has had her two Moderna COVID-19 vaccines. CD4 now of 740 and viral load just 40 copies.  No new issues.  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     Review of Systems  Constitutional: Negative for fatigue and unexpected weight change.  Gastrointestinal: Negative for diarrhea and nausea.  Skin: Negative for rash.       Objective:   Physical Exam Eyes:     General: No scleral icterus. Cardiovascular:     Rate and Rhythm: Normal rate and regular rhythm.  Pulmonary:     Effort: Pulmonary effort is normal.  Neurological:     General: No focal deficit present.     Mental Status: She is alert.  Psychiatric:        Mood and Affect: Mood normal.   SH: no tobacco        Assessment & Plan:

## 2020-04-01 NOTE — Assessment & Plan Note (Signed)
New finding.  She will monitor this at home and at school/work and let me know if it remains elevated.

## 2020-04-01 NOTE — Assessment & Plan Note (Signed)
She gets this through her PCP

## 2020-04-01 NOTE — Assessment & Plan Note (Signed)
Will screen, low risk with one partner (husband).

## 2020-04-01 NOTE — Assessment & Plan Note (Signed)
She continues to do well off of ARVs as a long term non-progressor and reluctant to start so will continue to monitor every 6 months.  RTC in 6 months.

## 2020-04-05 ENCOUNTER — Other Ambulatory Visit: Payer: Self-pay

## 2020-04-05 ENCOUNTER — Encounter: Payer: Self-pay | Admitting: Family Medicine

## 2020-04-05 ENCOUNTER — Ambulatory Visit: Payer: Managed Care, Other (non HMO) | Admitting: Family Medicine

## 2020-04-05 VITALS — BP 134/87 | HR 116 | Ht 65.0 in | Wt 155.0 lb

## 2020-04-05 DIAGNOSIS — B20 Human immunodeficiency virus [HIV] disease: Secondary | ICD-10-CM | POA: Diagnosis not present

## 2020-04-05 DIAGNOSIS — G8929 Other chronic pain: Secondary | ICD-10-CM

## 2020-04-05 DIAGNOSIS — M545 Low back pain, unspecified: Secondary | ICD-10-CM | POA: Diagnosis not present

## 2020-04-05 DIAGNOSIS — Z0000 Encounter for general adult medical examination without abnormal findings: Secondary | ICD-10-CM

## 2020-04-05 DIAGNOSIS — D573 Sickle-cell trait: Secondary | ICD-10-CM | POA: Diagnosis not present

## 2020-04-05 DIAGNOSIS — M549 Dorsalgia, unspecified: Secondary | ICD-10-CM

## 2020-04-05 DIAGNOSIS — Z09 Encounter for follow-up examination after completed treatment for conditions other than malignant neoplasm: Secondary | ICD-10-CM
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
## 2020-04-06 LAB — COMPREHENSIVE METABOLIC PANEL
ALT: 18 IU/L (ref 0–32)
AST: 24 IU/L (ref 0–40)
Albumin/Globulin Ratio: 1.1 — ABNORMAL LOW (ref 1.2–2.2)
Albumin: 4.1 g/dL (ref 3.8–4.8)
Alkaline Phosphatase: 65 IU/L (ref 44–121)
BUN/Creatinine Ratio: 10 (ref 9–23)
BUN: 8 mg/dL (ref 6–24)
Bilirubin Total: 0.3 mg/dL (ref 0.0–1.2)
CO2: 20 mmol/L (ref 20–29)
Calcium: 8.9 mg/dL (ref 8.7–10.2)
Chloride: 100 mmol/L (ref 96–106)
Creatinine, Ser: 0.82 mg/dL (ref 0.57–1.00)
Globulin, Total: 3.8 g/dL (ref 1.5–4.5)
Glucose: 124 mg/dL — ABNORMAL HIGH (ref 65–99)
Potassium: 3.8 mmol/L (ref 3.5–5.2)
Sodium: 135 mmol/L (ref 134–144)
Total Protein: 7.9 g/dL (ref 6.0–8.5)
eGFR: 91 mL/min/{1.73_m2} (ref >59)

## 2020-04-06 LAB — CBC WITH DIFFERENTIAL/PLATELET
Basophils Absolute: 0 10*3/uL (ref 0.0–0.2)
Basos: 0 %
EOS (ABSOLUTE): 0.1 10*3/uL (ref 0.0–0.4)
Eos: 3 %
Hematocrit: 37.9 % (ref 34.0–46.6)
Hemoglobin: 12.8 g/dL (ref 11.1–15.9)
Immature Grans (Abs): 0 10*3/uL (ref 0.0–0.1)
Immature Granulocytes: 0 %
Lymphocytes Absolute: 1.9 10*3/uL (ref 0.7–3.1)
Lymphs: 47 %
MCH: 29.2 pg (ref 26.6–33.0)
MCHC: 33.8 g/dL (ref 31.5–35.7)
MCV: 87 fL (ref 79–97)
Monocytes Absolute: 0.5 10*3/uL (ref 0.1–0.9)
Monocytes: 12 %
Neutrophils Absolute: 1.5 10*3/uL (ref 1.4–7.0)
Neutrophils: 38 %
Platelets: 274 10*3/uL (ref 150–450)
RBC: 4.38 x10E6/uL (ref 3.77–5.28)
RDW: 12.8 % (ref 11.7–15.4)
WBC: 4 10*3/uL (ref 3.4–10.8)

## 2020-04-06 LAB — IRON AND TIBC
Iron Saturation: 28 % (ref 15–55)
Iron: 77 ug/dL (ref 27–159)
Total Iron Binding Capacity: 271 ug/dL (ref 250–450)
UIBC: 194 ug/dL (ref 131–425)

## 2020-04-06 LAB — LIPID PANEL
Chol/HDL Ratio: 3.6 ratio (ref 0.0–4.4)
Cholesterol, Total: 198 mg/dL (ref 100–199)
HDL: 55 mg/dL (ref >39)
LDL Chol Calc (NIH): 132 mg/dL — ABNORMAL HIGH (ref 0–99)
Triglycerides: 61 mg/dL (ref 0–149)
VLDL Cholesterol Cal: 11 mg/dL (ref 5–40)

## 2020-04-06 LAB — VITAMIN B12: Vitamin B-12: 770 pg/mL (ref 232–1245)

## 2020-04-06 LAB — VITAMIN D 25 HYDROXY (VIT D DEFICIENCY, FRACTURES): Vit D, 25-Hydroxy: 39.6 ng/mL (ref 30.0–100.0)

## 2020-04-06 LAB — TSH: TSH: 1.26 u[IU]/mL (ref 0.450–4.500)

## 2020-06-03 ENCOUNTER — Other Ambulatory Visit: Payer: Self-pay

## 2020-06-03 ENCOUNTER — Other Ambulatory Visit: Payer: Self-pay | Admitting: Nurse Practitioner

## 2020-06-03 ENCOUNTER — Ambulatory Visit (HOSPITAL_COMMUNITY)
Admission: RE | Admit: 2020-06-03 | Discharge: 2020-06-03 | Disposition: A | Discharge status: Home / Self Care | Payer: Managed Care, Other (non HMO) | Source: Ambulatory Visit | Attending: Nurse Practitioner | Admitting: Nurse Practitioner

## 2020-06-03 DIAGNOSIS — Z8701 Personal history of pneumonia (recurrent): Secondary | ICD-10-CM
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
## 2020-06-16 ENCOUNTER — Other Ambulatory Visit: Payer: Self-pay | Admitting: Nurse Practitioner

## 2020-06-16 DIAGNOSIS — M62838 Other muscle spasm: Secondary | ICD-10-CM

## 2020-06-16 MED ORDER — GABAPENTIN 300 MG PO CAPS: 300.0000 mg | ORAL_CAPSULE | Freq: Three times a day (TID) | ORAL | 3 refills | Status: DC

## 2020-06-16 NOTE — Progress Notes (Unsigned)
   [facility] [address], [place]  [address] Phone:  [phone]   Fax:  [phone] 

## 2020-07-18 ENCOUNTER — Ambulatory Visit (HOSPITAL_COMMUNITY)
Admission: RE | Admit: 2020-07-18 | Discharge: 2020-07-18 | Disposition: A | Discharge status: Home / Self Care | Payer: Managed Care, Other (non HMO) | Source: Ambulatory Visit | Attending: Nurse Practitioner | Admitting: Nurse Practitioner

## 2020-07-18 ENCOUNTER — Ambulatory Visit: Payer: Managed Care, Other (non HMO) | Admitting: Nurse Practitioner

## 2020-07-18 ENCOUNTER — Encounter: Payer: Self-pay | Admitting: Nurse Practitioner

## 2020-07-18 ENCOUNTER — Other Ambulatory Visit: Payer: Self-pay

## 2020-07-18 VITALS — BP 136/94 | HR 90 | Temp 97.4°F | Ht 65.0 in | Wt 155.0 lb

## 2020-07-18 DIAGNOSIS — M5441 Lumbago with sciatica, right side: Secondary | ICD-10-CM

## 2020-07-18 DIAGNOSIS — M25559 Pain in unspecified hip: Secondary | ICD-10-CM | POA: Diagnosis present

## 2020-07-18 DIAGNOSIS — J988 Other specified respiratory disorders: Secondary | ICD-10-CM

## 2020-07-18 DIAGNOSIS — R059 Cough, unspecified: Secondary | ICD-10-CM

## 2020-07-18 DIAGNOSIS — M5442 Lumbago with sciatica, left side: Secondary | ICD-10-CM

## 2020-07-18 DIAGNOSIS — G894 Chronic pain syndrome: Secondary | ICD-10-CM

## 2020-07-18 DIAGNOSIS — G8929 Other chronic pain: Secondary | ICD-10-CM | POA: Insufficient documentation

## 2020-07-18 DIAGNOSIS — M545 Low back pain, unspecified: Secondary | ICD-10-CM

## 2020-07-18 MED ORDER — TRAMADOL HCL 50 MG PO TABS: 50.0000 mg | ORAL_TABLET | Freq: Four times a day (QID) | ORAL | 0 refills | Status: DC | PRN

## 2020-07-18 MED ORDER — PSEUDOEPHEDRINE-GUAIFENESIN ER 120-1200 MG PO TB12: 1.0000 | ORAL_TABLET | Freq: Two times a day (BID) | ORAL | 0 refills | Status: DC

## 2020-07-18 MED ORDER — AMOXICILLIN-POT CLAVULANATE 875-125 MG PO TABS: 1.0000 | ORAL_TABLET | Freq: Two times a day (BID) | ORAL | 0 refills | Status: DC
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
## 2020-07-18 NOTE — Patient Instructions (Signed)
Sickle Cell Anemia, Adult  Sickle cell anemia is a condition where your red blood cells are shaped like sickles. Red blood cells carry oxygen through the body. Sickle-shaped cells do not live as long as normal red blood cells. They also clump together and block blood from flowing through the blood vessels. This prevents the body from getting enough oxygen. Sickle cell anemia causes organ damage and pain. It alsoincreases the risk of infection. Follow these instructions at home: Medicines Take over-the-counter and prescription medicines only as told by your doctor. If you were prescribed an antibiotic medicine, take it as told by your doctor. Do not stop taking the antibiotic even if you start to feel better. If you develop a fever, do not take medicines to lower the fever right away. Tell your doctor about the fever. Managing pain, stiffness, and swelling Try these methods to help with pain: Use a heating pad. Take a warm bath. Distract yourself, such as by watching TV. Eating and drinking Drink enough fluid to keep your pee (urine) clear or pale yellow. Drink more in hot weather and during exercise. Limit or avoid alcohol. Eat a healthy diet. Eat plenty of fruits, vegetables, whole grains, and lean protein. Take vitamins and supplements as told by your doctor. Traveling When traveling, keep these with you: Your medical information. The names of your doctors. Your medicines. If you need to take an airplane, talk to your doctor first. Activity Rest often. Avoid exercises that make your heart beat much faster, such as jogging. General instructions Do not use products that have nicotine or tobacco, such as cigarettes and e-cigarettes. If you need help quitting, ask your doctor. Consider wearing a medical alert bracelet. Avoid being in high places (high altitudes), such as mountains. Avoid very hot or cold temperatures. Avoid places where the temperature changes a lot. Keep all follow-up  visits as told by your doctor. This is important. Contact a doctor if: A joint hurts. Your feet or hands hurt or swell. You feel tired (fatigued). Get help right away if: You have symptoms of infection. These include: Fever. Chills. Being very tired. Irritability. Poor eating. Throwing up (vomiting). You feel dizzy or faint. You have new stomach pain, especially on the left side. You have a an erection (priapism) that lasts more than 4 hours. You have numbness in your arms or legs. You have a hard time moving your arms or legs. You have trouble talking. You have pain that does not go away when you take medicine. You are short of breath. You are breathing fast. You have a long-term cough. You have pain in your chest. You have a bad headache. You have a stiff neck. Your stomach looks bloated even though you did not eat much. Your skin is pale. You suddenly cannot see well. Summary Sickle cell anemia is a condition where your red blood cells are shaped like sickles. Follow your doctor's advice on ways to manage pain, food to eat, activities to do, and steps to take for safe travel. Get medical help right away if you have any signs of infection, such as a fever. This information is not intended to replace advice given to you by your health care provider. Make sure you discuss any questions you have with your healthcare provider. Document Revised: 06/11/2019 Document Reviewed: 05/13/202100  Lumbosacral Radiculopathy Lumbosacral radiculopathy is a condition that involves the spinal nerves and nerve roots in the low back and bottom of the spine. The condition develops when these nerves and  nerve roots move out of place or become inflamed andcause symptoms. What are the causes? This condition may be caused by: Pressure from a disk that bulges out of place (herniated disk). A disk is a plate of soft cartilage that separates bones in the spine. Disk changes that occur with age (disk  degeneration). A narrowing of the bones of the lower back (spinal stenosis). A tumor. An infection. An injury that places sudden pressure on the disks that cushion the bones of your lower spine. What increases the risk? You are more likely to develop this condition if: You are a female who is 67-54 years old. You are a female who is 42-74 years old. You use improper technique when lifting things. You are overweight or live a sedentary lifestyle. You smoke. Your work requires frequent lifting. You do repetitive activities that strain the spine. What are the signs or symptoms? Symptoms of this condition include: Pain that goes down from your back into your legs (sciatica), usually on one side of the body. This is the most common symptom. The pain may be worse with sitting, coughing, or sneezing. Pain and numbness in your legs. Muscle weakness. Tingling. Loss of bladder control or bowel control. How is this diagnosed? This condition may be diagnosed based on: Your symptoms and medical history. A physical exam. If the pain is lasting, you may have tests, such as: MRI scan. X-ray. CT scan. A type of X-ray used to examine the spinal canal after injecting a dye into your spine (myelogram). A test to measure how electrical impulses move through a nerve (nerve conduction study). How is this treated? Treatment may depend on the cause of the condition and may include: Working with a physical therapist. Taking pain medicine. Applying heat and ice to affected areas. Doing stretches to improve flexibility. Doing exercises to strengthen back muscles. Having chiropractic spinal manipulation. Using transcutaneous electrical nerve stimulation (TENS) therapy. Getting a steroid injection in the spine. In some cases, no treatment is needed. If the condition is long-lasting (chronic), or if symptoms are severe, treatment may involve surgery or lifestylechanges, such as following a weight-loss  plan. Follow these instructions at home: Activity Avoid bending and other activities that make the problem worse. Maintain a proper position when standing or sitting: When standing, keep your upper back and neck straight, with your shoulders pulled back. Avoid slouching. When sitting, keep your back straight and relax your shoulders. Do not round your shoulders or pull them backward. Do not sit or stand in one place for long periods of time. Take brief periods of rest throughout the day. This will reduce your pain. It is usually better to rest by lying down or standing, not sitting. When you are resting for longer periods, mix in some mild activity or stretching between periods of rest. This will help to prevent stiffness and pain. Get regular exercise. Ask your health care provider what activities are safe for you. If you were shown how to do any exercises or stretches, do them as directed by your health care provider. Do not lift anything that is heavier than 10 lb (4.5 kg) or the limit that you are told by your health care provider. Always use proper lifting technique, which includes: Bending your knees. Keeping the load close to your body. Avoiding twisting. Managing pain If directed, put ice on the affected area: Put ice in a plastic bag. Place a towel between your skin and the bag. Leave the ice on for 20 minutes,  2-3 times a day. If directed, apply heat to the affected area as often as told by your health care provider. Use the heat source that your health care provider recommends, such as a moist heat pack or a heating pad. Place a towel between your skin and the heat source. Leave the heat on for 20-30 minutes. Remove the heat if your skin turns bright red. This is especially important if you are unable to feel pain, heat, or cold. You may have a greater risk of getting burned. Take over-the-counter and prescription medicines only as told by your health care provider. General  instructions Sleep on a firm mattress in a comfortable position. Try lying on your side with your knees slightly bent. If you lie on your back, put a pillow under your knees. Do not drive or use heavy machinery while taking prescription pain medicine. If your health care provider prescribed a diet or exercise program, follow it as directed. Keep all follow-up visits as told by your health care provider. This is important. Contact a health care provider if: Your pain does not improve over time, even when taking pain medicines. Get help right away if: You develop severe pain. Your pain suddenly gets worse. You develop increasing weakness in your legs. You lose the ability to control your bladder or bowel. You have difficulty walking or balancing. You have a fever. Summary Lumbosacral radiculopathy is a condition that occurs when the spinal nerves and nerve roots in the lower part of the spine move out of place or become inflamed and cause symptoms. Symptoms include pain, numbness, and tingling that go down from your back into your legs (sciatica), muscle weakness, and loss of bladder control or bowel control. If directed, apply ice or heat to the affected area as told by your health care provider. Follow instructions about activity, rest, and proper lifting technique. This information is not intended to replace advice given to you by your health care provider. Make sure you discuss any questions you have with your healthcare provider. Document Revised: 11/26/2019 Document Reviewed: 11/26/2019 Elsevier Patient Education  2022 [employer].  Elsevier Patient Education  2022 [employer].

## 2020-07-19 ENCOUNTER — Ambulatory Visit: Payer: Managed Care, Other (non HMO) | Admitting: Family Medicine

## 2020-07-20 ENCOUNTER — Other Ambulatory Visit: Payer: Self-pay | Admitting: Nurse Practitioner

## 2020-07-20 MED ORDER — ALBUTEROL SULFATE (2.5 MG/3ML) 0.083% IN NEBU: 2.5000 mg | INHALATION_SOLUTION | RESPIRATORY_TRACT | 2 refills | Status: DC | PRN

## 2020-07-20 MED ORDER — ALBUTEROL SULFATE HFA 108 (90 BASE) MCG/ACT IN AERS: 2.0000 | INHALATION_SPRAY | Freq: Four times a day (QID) | RESPIRATORY_TRACT | 2 refills | Status: DC | PRN

## 2020-08-24 ENCOUNTER — Encounter (HOSPITAL_BASED_OUTPATIENT_CLINIC_OR_DEPARTMENT_OTHER): Payer: Self-pay

## 2020-08-24 ENCOUNTER — Emergency Department (HOSPITAL_BASED_OUTPATIENT_CLINIC_OR_DEPARTMENT_OTHER)
Admission: EM | Admit: 2020-08-24 | Discharge: 2020-08-25 | Disposition: A | Discharge status: Home / Self Care | Payer: Managed Care, Other (non HMO) | Attending: Emergency Medicine | Admitting: Emergency Medicine

## 2020-08-24 ENCOUNTER — Other Ambulatory Visit: Payer: Self-pay

## 2020-08-24 ENCOUNTER — Emergency Department (HOSPITAL_BASED_OUTPATIENT_CLINIC_OR_DEPARTMENT_OTHER): Payer: Managed Care, Other (non HMO)

## 2020-08-24 DIAGNOSIS — X58XXXA Exposure to other specified factors, initial encounter: Secondary | ICD-10-CM | POA: Insufficient documentation

## 2020-08-24 DIAGNOSIS — T148XXA Other injury of unspecified body region, initial encounter: Secondary | ICD-10-CM

## 2020-08-24 DIAGNOSIS — Z21 Asymptomatic human immunodeficiency virus [HIV] infection status: Secondary | ICD-10-CM | POA: Diagnosis not present

## 2020-08-24 DIAGNOSIS — S86911A Strain of unspecified muscle(s) and tendon(s) at lower leg level, right leg, initial encounter: Secondary | ICD-10-CM | POA: Diagnosis not present

## 2020-08-24 DIAGNOSIS — D5700 Hb-SS disease with crisis, unspecified: Secondary | ICD-10-CM | POA: Insufficient documentation

## 2020-08-24 DIAGNOSIS — M545 Low back pain, unspecified: Secondary | ICD-10-CM | POA: Insufficient documentation

## 2020-08-24 DIAGNOSIS — Z91040 Latex allergy status: Secondary | ICD-10-CM | POA: Diagnosis not present

## 2020-08-24 DIAGNOSIS — M79604 Pain in right leg: Secondary | ICD-10-CM | POA: Diagnosis not present

## 2020-08-24 DIAGNOSIS — S8991XA Unspecified injury of right lower leg, initial encounter: Secondary | ICD-10-CM | POA: Diagnosis present

## 2020-08-24 LAB — CBC WITH DIFFERENTIAL/PLATELET
Abs Immature Granulocytes: 0.01 10*3/uL (ref 0.00–0.07)
Basophils Absolute: 0 10*3/uL (ref 0.0–0.1)
Basophils Relative: 0 %
Eosinophils Absolute: 0.2 10*3/uL (ref 0.0–0.5)
Eosinophils Relative: 3 %
HCT: 37.7 % (ref 36.0–46.0)
Hemoglobin: 12.9 g/dL (ref 12.0–15.0)
Immature Granulocytes: 0 %
Lymphocytes Relative: 47 %
Lymphs Abs: 2.6 10*3/uL (ref 0.7–4.0)
MCH: 29.5 pg (ref 26.0–34.0)
MCHC: 34.2 g/dL (ref 30.0–36.0)
MCV: 86.3 fL (ref 80.0–100.0)
Monocytes Absolute: 0.4 10*3/uL (ref 0.1–1.0)
Monocytes Relative: 8 %
Neutro Abs: 2.3 10*3/uL (ref 1.7–7.7)
Neutrophils Relative %: 42 %
Platelets: 281 10*3/uL (ref 150–400)
RBC: 4.37 MIL/uL (ref 3.87–5.11)
RDW: 13.7 % (ref 11.5–15.5)
WBC: 5.6 10*3/uL (ref 4.0–10.5)
nRBC: 0 % (ref 0.0–0.2)

## 2020-08-24 LAB — RETICULOCYTES
Immature Retic Fract: 11.4 % (ref 2.3–15.9)
RBC.: 4.25 MIL/uL (ref 3.87–5.11)
Retic Count, Absolute: 66.3 10*3/uL (ref 19.0–186.0)
Retic Ct Pct: 1.6 % (ref 0.4–3.1)

## 2020-08-24 LAB — COMPREHENSIVE METABOLIC PANEL
ALT: 15 U/L (ref 0–44)
AST: 22 U/L (ref 15–41)
Albumin: 4.2 g/dL (ref 3.5–5.0)
Alkaline Phosphatase: 50 U/L (ref 38–126)
Anion gap: 9 (ref 5–15)
BUN: 12 mg/dL (ref 6–20)
CO2: 24 mmol/L (ref 22–32)
Calcium: 8.9 mg/dL (ref 8.9–10.3)
Chloride: 101 mmol/L (ref 98–111)
Creatinine, Ser: 0.79 mg/dL (ref 0.44–1.00)
GFR, Estimated: >60 mL/min (ref >60)
Glucose, Bld: 103 mg/dL — ABNORMAL HIGH (ref 70–99)
Potassium: 4.1 mmol/L (ref 3.5–5.1)
Sodium: 134 mmol/L — ABNORMAL LOW (ref 135–145)
Total Bilirubin: 0.5 mg/dL (ref 0.3–1.2)
Total Protein: 8.3 g/dL — ABNORMAL HIGH (ref 6.5–8.1)

## 2020-08-24 LAB — PREGNANCY, URINE: Preg Test, Ur: NEGATIVE

## 2020-08-24 MED ORDER — DIAZEPAM 5 MG/ML IJ SOLN
2.5000 mg | Freq: Once | INTRAMUSCULAR | Status: AC
  Administered 2020-08-24: 2.5 mg via INTRAVENOUS
  Filled 2020-08-24: qty 2

## 2020-08-24 MED ORDER — KETOROLAC TROMETHAMINE 30 MG/ML IJ SOLN
30.0000 mg | Freq: Once | INTRAMUSCULAR | Status: AC
  Administered 2020-08-24: 30 mg via INTRAVENOUS
  Filled 2020-08-24: qty 1

## 2020-08-24 NOTE — ED Triage Notes (Signed)
Patient here POV from Home with Sickle Cell Pain Crisis.  Crisis has been present for approximately 4 days. Pain is primarily located in the Right Foot and radiates up towards RLQ ABD. Patient also complaining of Pain to Left Knee and surrounding tissue.   Tramadol and Gabapentin have been ineffective at Home.  Ambulatory. Visibly Uncomfortable during Triage. A&Ox4. GCS 15.
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
## 2020-08-25 NOTE — Discharge Instructions (Addendum)
You may use over-the-counter Motrin (Ibuprofen), Acetaminophen (Tylenol), topical muscle creams such as SalonPas, Icy Hot, Bengay, etc. Please stretch, apply ice or heat (whichever helps), and have massage therapy for additional assistance.  

## 2020-09-30 ENCOUNTER — Other Ambulatory Visit (HOSPITAL_COMMUNITY)
Admission: RE | Admit: 2020-09-30 | Discharge: 2020-09-30 | Disposition: A | Discharge status: Home / Self Care | Payer: Managed Care, Other (non HMO) | Source: Ambulatory Visit | Attending: Internal Medicine | Admitting: Internal Medicine

## 2020-09-30 ENCOUNTER — Other Ambulatory Visit: Payer: Managed Care, Other (non HMO)

## 2020-09-30 ENCOUNTER — Other Ambulatory Visit: Payer: Self-pay

## 2020-09-30 DIAGNOSIS — B20 Human immunodeficiency virus [HIV] disease: Secondary | ICD-10-CM | POA: Insufficient documentation

## 2020-09-30 DIAGNOSIS — Z113 Encounter for screening for infections with a predominantly sexual mode of transmission: Secondary | ICD-10-CM | POA: Diagnosis present

## 2020-10-04 LAB — HIV-1 RNA QUANT-NO REFLEX-BLD
HIV 1 RNA Quant: NOT DETECTED Copies/mL
HIV-1 RNA Quant, Log: NOT DETECTED Log cps/mL

## 2020-10-04 LAB — T-HELPER CELLS (CD4) COUNT (NOT AT ARMC)
Absolute CD4: 769 cells/uL (ref 490–1740)
CD4 T Helper %: 41 % (ref 30–61)
Total lymphocyte count: 1868 cells/uL (ref 850–3900)

## 2020-10-04 LAB — URINE CYTOLOGY ANCILLARY ONLY
Chlamydia: NEGATIVE
Comment: NEGATIVE
Comment: NORMAL
Neisseria Gonorrhea: NEGATIVE

## 2020-10-04 LAB — RPR: RPR Ser Ql: NONREACTIVE

## 2020-10-07 ENCOUNTER — Ambulatory Visit: Payer: Managed Care, Other (non HMO) | Admitting: Family Medicine

## 2020-10-17 ENCOUNTER — Ambulatory Visit: Payer: Managed Care, Other (non HMO) | Admitting: Internal Medicine

## 2020-10-17 ENCOUNTER — Encounter: Payer: Self-pay | Admitting: Internal Medicine

## 2020-10-17 ENCOUNTER — Other Ambulatory Visit: Payer: Self-pay

## 2020-10-17 VITALS — BP 110/75 | HR 105 | Temp 98.3°F | Wt 157.0 lb

## 2020-10-17 DIAGNOSIS — Z23 Encounter for immunization: Secondary | ICD-10-CM

## 2020-10-17 DIAGNOSIS — B20 Human immunodeficiency virus [HIV] disease: Secondary | ICD-10-CM
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
## 2020-10-17 NOTE — Assessment & Plan Note (Signed)
Discussed pneumovax and given today 

## 2020-10-17 NOTE — Progress Notes (Signed)
   Subjective:    Patient ID: Tina Huber, female    DOB: 06/09/1975, 45 y.o.   MRN: [id number]  HPI She is here for follow up of HIV She is a long term non-progressor and no new issues.  CD4 769 and viral load not detected.  Viral load has fluctuated between not detected and 40s.  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   She has noted more fatigue recently and scheduled to see her PCP next week.  Sleeping well, no diet or weight changes.    Review of Systems  Constitutional:  Positive for fatigue. Negative for unexpected weight change.  Gastrointestinal:  Negative for diarrhea and nausea.  Skin:  Negative for rash.      Objective:   Physical Exam Eyes:     General: No scleral icterus. Pulmonary:     Effort: Pulmonary effort is normal.  Neurological:     General: No focal deficit present.     Mental Status: She is alert.  Psychiatric:        Mood and Affect: Mood normal.   SH: no tobacco       Assessment & Plan:

## 2020-10-24 ENCOUNTER — Ambulatory Visit: Payer: Managed Care, Other (non HMO)

## 2020-11-17 ENCOUNTER — Ambulatory Visit: Payer: Managed Care, Other (non HMO) | Admitting: Nurse Practitioner

## 2020-11-17 ENCOUNTER — Encounter: Payer: Self-pay | Admitting: Nurse Practitioner

## 2020-11-17 ENCOUNTER — Other Ambulatory Visit: Payer: Self-pay

## 2020-11-17 VITALS — BP 115/81 | HR 99 | Temp 98.2°F | Ht 65.0 in | Wt 162.0 lb

## 2020-11-17 DIAGNOSIS — R509 Fever, unspecified: Secondary | ICD-10-CM

## 2020-11-17 LAB — POC INFLUENZA A&B (BINAX/QUICKVUE)
Influenza A, POC: NEGATIVE
Influenza B, POC: NEGATIVE
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
## 2020-12-09 ENCOUNTER — Other Ambulatory Visit: Payer: Self-pay | Admitting: Nurse Practitioner

## 2020-12-09 DIAGNOSIS — M62838 Other muscle spasm: Secondary | ICD-10-CM

## 2020-12-09 DIAGNOSIS — G8929 Other chronic pain: Secondary | ICD-10-CM

## 2020-12-09 MED ORDER — ALBUTEROL SULFATE HFA 108 (90 BASE) MCG/ACT IN AERS: 2.0000 | INHALATION_SPRAY | Freq: Four times a day (QID) | RESPIRATORY_TRACT | 2 refills | Status: DC | PRN

## 2020-12-09 MED ORDER — GABAPENTIN 300 MG PO CAPS: 300.0000 mg | ORAL_CAPSULE | Freq: Three times a day (TID) | ORAL | 3 refills | Status: DC

## 2020-12-09 MED ORDER — TRAMADOL HCL 50 MG PO TABS: 50.0000 mg | ORAL_TABLET | Freq: Four times a day (QID) | ORAL | 0 refills | Status: DC | PRN

## 2021-01-03 ENCOUNTER — Telehealth: Payer: Self-pay

## 2021-01-03 NOTE — Telephone Encounter (Signed)
Called patient to get her scheduled for flu and covid shots, no answer and voicemail is full.

## 2021-01-05 ENCOUNTER — Ambulatory Visit (INDEPENDENT_AMBULATORY_CARE_PROVIDER_SITE_OTHER): Payer: Managed Care, Other (non HMO)

## 2021-01-05 ENCOUNTER — Other Ambulatory Visit: Payer: Self-pay

## 2021-01-05 DIAGNOSIS — Z23 Encounter for immunization: Secondary | ICD-10-CM | POA: Diagnosis not present

## 2021-01-05 NOTE — Progress Notes (Signed)
Patient here for flu vaccine, tolerated well.  Opal Dinning D Mariamawit Depaoli, RN  

## 2021-01-05 NOTE — Progress Notes (Signed)
   Covid-19 Vaccination Clinic  Name:  Jaycelynn Knickerbocker    MRN: [id number] DOB: 20-Sep-1975  01/05/2021  Ms. Mccutcheon declined observation, states she will call if she experiences any adverse effects. Educated on importance of observation period. She declined information on the vaccine.   Ms. Rogue was instructed to call 911 with any severe reactions post vaccine: Difficulty breathing  Swelling of face and throat  A fast heartbeat  A bad rash all over body  Dizziness and weakness   Immunizations Administered     Name Date Dose VIS Date Route   Pfizer Covid-19 Vaccine Bivalent Booster 01/05/2021  9:21 AM 0.3 mL 09/28/2020 Intramuscular   Manufacturer: [employer], [employer]   Lot: [id number]   NDC: [address]-8      Sandie Ano, RN

## 2021-01-09 ENCOUNTER — Other Ambulatory Visit: Payer: Self-pay | Admitting: Nurse Practitioner

## 2021-01-09 ENCOUNTER — Encounter: Payer: Self-pay | Admitting: Internal Medicine

## 2021-01-09 DIAGNOSIS — M791 Myalgia, unspecified site: Secondary | ICD-10-CM | POA: Insufficient documentation

## 2021-01-09 DIAGNOSIS — G5600 Carpal tunnel syndrome, unspecified upper limb: Secondary | ICD-10-CM | POA: Insufficient documentation

## 2021-01-09 DIAGNOSIS — G8929 Other chronic pain: Secondary | ICD-10-CM

## 2021-01-09 DIAGNOSIS — M1991 Primary osteoarthritis, unspecified site: Secondary | ICD-10-CM | POA: Insufficient documentation

## 2021-01-09 DIAGNOSIS — E669 Obesity, unspecified: Secondary | ICD-10-CM | POA: Insufficient documentation

## 2021-01-09 DIAGNOSIS — M545 Low back pain, unspecified: Secondary | ICD-10-CM

## 2021-01-09 MED ORDER — TRAMADOL HCL 50 MG PO TABS: 50.0000 mg | ORAL_TABLET | Freq: Four times a day (QID) | ORAL | 0 refills | Status: DC | PRN

## 2021-01-09 MED ORDER — CIPROFLOXACIN HCL 500 MG PO TABS: 500.0000 mg | ORAL_TABLET | Freq: Two times a day (BID) | ORAL | 0 refills | Status: DC

## 2021-01-09 MED ORDER — CIPROFLOXACIN HCL 500 MG PO TABS: 500.0000 mg | ORAL_TABLET | Freq: Two times a day (BID) | ORAL | 0 refills | Status: AC

## 2021-01-11 LAB — T-HELPER CELLS (CD4) COUNT (NOT AT ARMC)
Absolute CD4: 893 cells/uL (ref 490–1740)
CD4 T Helper %: 41 % (ref 30–61)
Total lymphocyte count: 2205 cells/uL (ref 850–3900)

## 2021-02-17 ENCOUNTER — Ambulatory Visit: Payer: Managed Care, Other (non HMO) | Admitting: Nurse Practitioner

## 2021-02-17 ENCOUNTER — Other Ambulatory Visit: Payer: Self-pay

## 2021-02-17 ENCOUNTER — Encounter: Payer: Self-pay | Admitting: Nurse Practitioner

## 2021-02-17 VITALS — BP 119/87 | HR 104 | Resp 16 | Wt 169.0 lb

## 2021-02-17 DIAGNOSIS — Z111 Encounter for screening for respiratory tuberculosis: Secondary | ICD-10-CM | POA: Diagnosis not present

## 2021-02-17 DIAGNOSIS — M545 Low back pain, unspecified: Secondary | ICD-10-CM | POA: Diagnosis not present

## 2021-02-17 DIAGNOSIS — G8929 Other chronic pain: Secondary | ICD-10-CM | POA: Diagnosis not present

## 2021-02-17 MED ORDER — KETOROLAC TROMETHAMINE 30 MG/ML IJ SOLN
15.0000 mg | Freq: Once | INTRAMUSCULAR | Status: AC
  Administered 2021-02-17: 15 mg via INTRAMUSCULAR

## 2021-02-17 MED ORDER — PREDNISONE 20 MG PO TABS: 20.0000 mg | ORAL_TABLET | Freq: Every day | ORAL | 0 refills | Status: AC

## 2021-02-17 MED ORDER — LIDOCAINE 4 % EX PTCH: 1.0000 | MEDICATED_PATCH | CUTANEOUS | 0 refills | Status: AC

## 2021-02-17 MED ORDER — METHOCARBAMOL 500 MG PO TABS: 500.0000 mg | ORAL_TABLET | Freq: Four times a day (QID) | ORAL | 0 refills | Status: DC | PRN

## 2021-02-17 NOTE — Patient Instructions (Signed)
You were seen today in the [facility] for back pain. Labs were collected, results will be available via MyChart or, if abnormal, you will be contacted by clinic staff. You were prescribed medications, please take as directed. Please follow up as needed

## 2021-02-17 NOTE — Addendum Note (Signed)
Addended by: Bo Merino on: 02/17/2021 02:00 PM   Modules accepted: Orders
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
## 2021-02-21 LAB — QUANTIFERON-TB GOLD PLUS
QuantiFERON Mitogen Value: >10 IU/mL
QuantiFERON Nil Value: 0.09 IU/mL
QuantiFERON TB1 Ag Value: 0.1 IU/mL
QuantiFERON TB2 Ag Value: 0.18 IU/mL
QuantiFERON-TB Gold Plus: NEGATIVE

## 2021-02-22 DIAGNOSIS — M25559 Pain in unspecified hip: Secondary | ICD-10-CM

## 2021-02-22 DIAGNOSIS — G8929 Other chronic pain: Secondary | ICD-10-CM

## 2021-02-24 ENCOUNTER — Ambulatory Visit (HOSPITAL_COMMUNITY)
Admission: RE | Admit: 2021-02-24 | Discharge: 2021-02-24 | Disposition: A | Discharge status: Home / Self Care | Payer: Managed Care, Other (non HMO) | Source: Ambulatory Visit | Attending: Nurse Practitioner | Admitting: Nurse Practitioner

## 2021-02-24 ENCOUNTER — Other Ambulatory Visit: Payer: Self-pay | Admitting: Nurse Practitioner

## 2021-02-24 ENCOUNTER — Other Ambulatory Visit: Payer: Self-pay

## 2021-02-24 DIAGNOSIS — Z111 Encounter for screening for respiratory tuberculosis: Secondary | ICD-10-CM | POA: Insufficient documentation

## 2021-03-07 ENCOUNTER — Ambulatory Visit (HOSPITAL_COMMUNITY)
Admission: EM | Admit: 2021-03-07 | Discharge: 2021-03-07 | Disposition: A | Discharge status: Home / Self Care | Payer: Managed Care, Other (non HMO) | Attending: Family Medicine | Admitting: Family Medicine

## 2021-03-07 ENCOUNTER — Encounter (HOSPITAL_COMMUNITY): Payer: Self-pay

## 2021-03-07 ENCOUNTER — Other Ambulatory Visit: Payer: Self-pay

## 2021-03-07 DIAGNOSIS — M545 Low back pain, unspecified: Secondary | ICD-10-CM

## 2021-03-07 MED ORDER — DEXAMETHASONE SODIUM PHOSPHATE 10 MG/ML IJ SOLN
INTRAMUSCULAR | Status: AC
  Filled 2021-03-07: qty 1

## 2021-03-07 MED ORDER — KETOROLAC TROMETHAMINE 60 MG/2ML IM SOLN
INTRAMUSCULAR | Status: AC
  Filled 2021-03-07: qty 2

## 2021-03-07 MED ORDER — DEXAMETHASONE SODIUM PHOSPHATE 10 MG/ML IJ SOLN
10.0000 mg | Freq: Once | INTRAMUSCULAR | Status: AC
  Administered 2021-03-07: 10 mg via INTRAMUSCULAR

## 2021-03-07 MED ORDER — DIAZEPAM 5 MG PO TABS: 5.0000 mg | ORAL_TABLET | Freq: Two times a day (BID) | ORAL | 0 refills | Status: DC | PRN

## 2021-03-07 MED ORDER — KETOROLAC TROMETHAMINE 60 MG/2ML IM SOLN
60.0000 mg | Freq: Once | INTRAMUSCULAR | Status: AC
  Administered 2021-03-07: 60 mg via INTRAMUSCULAR

## 2021-03-07 NOTE — ED Triage Notes (Signed)
Pt presents with right side back pain X 1 month.

## 2021-03-07 NOTE — Discharge Instructions (Signed)
Meds ordered this encounter  Medications   ketorolac (TORADOL) injection 60 mg   dexamethasone (DECADRON) injection 10 mg   diazepam (VALIUM) 5 MG tablet    Sig: Take 1 tablet (5 mg total) by mouth every 12 (twelve) hours as needed for muscle spasms.    Dispense:  10 tablet    Refill:  0
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
## 2021-03-13 ENCOUNTER — Other Ambulatory Visit: Payer: Self-pay

## 2021-03-13 ENCOUNTER — Ambulatory Visit (HOSPITAL_COMMUNITY)
Admission: RE | Admit: 2021-03-13 | Discharge: 2021-03-13 | Disposition: A | Discharge status: Home / Self Care | Payer: Managed Care, Other (non HMO) | Source: Ambulatory Visit | Attending: Nurse Practitioner | Admitting: Nurse Practitioner

## 2021-03-13 DIAGNOSIS — M25559 Pain in unspecified hip: Secondary | ICD-10-CM | POA: Diagnosis present

## 2021-03-13 DIAGNOSIS — M5441 Lumbago with sciatica, right side: Secondary | ICD-10-CM | POA: Diagnosis present

## 2021-03-13 DIAGNOSIS — M5442 Lumbago with sciatica, left side: Secondary | ICD-10-CM | POA: Diagnosis present

## 2021-03-13 DIAGNOSIS — G8929 Other chronic pain: Secondary | ICD-10-CM | POA: Diagnosis present

## 2021-03-17 ENCOUNTER — Emergency Department (HOSPITAL_BASED_OUTPATIENT_CLINIC_OR_DEPARTMENT_OTHER): Payer: Managed Care, Other (non HMO)

## 2021-03-17 ENCOUNTER — Other Ambulatory Visit: Payer: Self-pay

## 2021-03-17 ENCOUNTER — Emergency Department (HOSPITAL_BASED_OUTPATIENT_CLINIC_OR_DEPARTMENT_OTHER)
Admission: EM | Admit: 2021-03-17 | Discharge: 2021-03-17 | Disposition: A | Discharge status: Home / Self Care | Payer: Managed Care, Other (non HMO) | Attending: Emergency Medicine | Admitting: Emergency Medicine

## 2021-03-17 ENCOUNTER — Encounter (HOSPITAL_BASED_OUTPATIENT_CLINIC_OR_DEPARTMENT_OTHER): Payer: Self-pay | Admitting: *Deleted

## 2021-03-17 DIAGNOSIS — R1011 Right upper quadrant pain: Secondary | ICD-10-CM | POA: Diagnosis not present

## 2021-03-17 DIAGNOSIS — M549 Dorsalgia, unspecified: Secondary | ICD-10-CM | POA: Diagnosis not present

## 2021-03-17 DIAGNOSIS — Z91040 Latex allergy status: Secondary | ICD-10-CM | POA: Diagnosis not present

## 2021-03-17 DIAGNOSIS — B20 Human immunodeficiency virus [HIV] disease: Secondary | ICD-10-CM | POA: Diagnosis not present

## 2021-03-17 DIAGNOSIS — R109 Unspecified abdominal pain: Secondary | ICD-10-CM

## 2021-03-17 LAB — COMPREHENSIVE METABOLIC PANEL
ALT: 12 U/L (ref 0–44)
AST: 11 U/L — ABNORMAL LOW (ref 15–41)
Albumin: 3.9 g/dL (ref 3.5–5.0)
Alkaline Phosphatase: 49 U/L (ref 38–126)
Anion gap: 6 (ref 5–15)
BUN: 15 mg/dL (ref 6–20)
CO2: 31 mmol/L (ref 22–32)
Calcium: 9.1 mg/dL (ref 8.9–10.3)
Chloride: 100 mmol/L (ref 98–111)
Creatinine, Ser: 0.85 mg/dL (ref 0.44–1.00)
GFR, Estimated: >60 mL/min (ref >60)
Glucose, Bld: 98 mg/dL (ref 70–99)
Potassium: 3.9 mmol/L (ref 3.5–5.1)
Sodium: 137 mmol/L (ref 135–145)
Total Bilirubin: 0.4 mg/dL (ref 0.3–1.2)
Total Protein: 7 g/dL (ref 6.5–8.1)

## 2021-03-17 LAB — CBC WITH DIFFERENTIAL/PLATELET
Abs Immature Granulocytes: 0.11 10*3/uL — ABNORMAL HIGH (ref 0.00–0.07)
Basophils Absolute: 0 10*3/uL (ref 0.0–0.1)
Basophils Relative: 0 %
Eosinophils Absolute: 0.1 10*3/uL (ref 0.0–0.5)
Eosinophils Relative: 2 %
HCT: 35 % — ABNORMAL LOW (ref 36.0–46.0)
Hemoglobin: 11.9 g/dL — ABNORMAL LOW (ref 12.0–15.0)
Immature Granulocytes: 2 %
Lymphocytes Relative: 36 %
Lymphs Abs: 2.5 10*3/uL (ref 0.7–4.0)
MCH: 29 pg (ref 26.0–34.0)
MCHC: 34 g/dL (ref 30.0–36.0)
MCV: 85.2 fL (ref 80.0–100.0)
Monocytes Absolute: 0.5 10*3/uL (ref 0.1–1.0)
Monocytes Relative: 8 %
Neutro Abs: 3.7 10*3/uL (ref 1.7–7.7)
Neutrophils Relative %: 52 %
Platelets: 263 10*3/uL (ref 150–400)
RBC: 4.11 MIL/uL (ref 3.87–5.11)
RDW: 14.4 % (ref 11.5–15.5)
WBC: 6.9 10*3/uL (ref 4.0–10.5)
nRBC: 0 % (ref 0.0–0.2)

## 2021-03-17 LAB — URINALYSIS, ROUTINE W REFLEX MICROSCOPIC
Bilirubin Urine: NEGATIVE
Glucose, UA: NEGATIVE mg/dL
Ketones, ur: NEGATIVE mg/dL
Leukocytes,Ua: NEGATIVE
Nitrite: NEGATIVE
Protein, ur: NEGATIVE mg/dL
Specific Gravity, Urine: 1.007 (ref 1.005–1.030)
pH: 6.5 (ref 5.0–8.0)

## 2021-03-17 LAB — D-DIMER, QUANTITATIVE: D-Dimer, Quant: 0.41 ug/mL-FEU (ref 0.00–0.50)

## 2021-03-17 MED ORDER — METHOCARBAMOL 500 MG PO TABS
500.0000 mg | ORAL_TABLET | Freq: Four times a day (QID) | ORAL | 0 refills | Status: DC
Start: 2021-03-17 — End: 2021-04-18

## 2021-03-17 MED ORDER — OXYCODONE-ACETAMINOPHEN 5-325 MG PO TABS: 1.0000 | ORAL_TABLET | Freq: Four times a day (QID) | ORAL | 0 refills | Status: DC | PRN

## 2021-03-17 MED ORDER — LOPERAMIDE HCL 2 MG PO CAPS: 4.0000 mg | ORAL_CAPSULE | Freq: Once | ORAL | Status: DC

## 2021-03-17 MED ORDER — OXYCODONE-ACETAMINOPHEN 5-325 MG PO TABS
1.0000 | ORAL_TABLET | Freq: Once | ORAL | Status: AC
  Administered 2021-03-17: 1 via ORAL
  Filled 2021-03-17: qty 1

## 2021-03-17 NOTE — ED Notes (Signed)
EMT-P provided AVS using Teachback Method. Patient verbalizes understanding of Discharge Instructions. Opportunity for Questioning and Answers were provided by EMT-P. Patient Discharged from ED.  ? ?

## 2021-03-17 NOTE — ED Triage Notes (Signed)
Right sided back pain down entire right side of back not associated with any trauma x over a month.  Pt has been seen for this several times

## 2021-03-17 NOTE — Discharge Instructions (Addendum)
Please read and follow all provided instructions.  Your diagnoses today include:  1. Right flank pain     Tests performed today include: Complete blood cell count: Normal white blood cell count Complete metabolic panel: Normal liver and kidney function testing Urinalysis (urine test): No signs of infection D-dimer: Screening test for blood clot was negative Ultrasound of the gallbladder: Was negative for gallstones Vital signs. See below for your results today.   Medications prescribed:  Percocet (oxycodone/acetaminophen) - narcotic pain medication  DO NOT drive or perform any activities that require you to be awake and alert because this medicine can make you drowsy. BE VERY CAREFUL not to take multiple medicines containing Tylenol (also called acetaminophen). Doing so can lead to an overdose which can damage your liver and cause liver failure and possibly death.  Robaxin (methocarbamol) - muscle relaxer medication  DO NOT drive or perform any activities that require you to be awake and alert because this medicine can make you drowsy.   Take any prescribed medications only as directed.  Home care instructions:  Follow any educational materials contained in this packet.  BE VERY CAREFUL not to take multiple medicines containing Tylenol (also called acetaminophen). Doing so can lead to an overdose which can damage your liver and cause liver failure and possibly death.   Follow-up instructions: Please follow-up with your primary care provider in the next 3 days for further evaluation of your symptoms.   Return instructions:  Please return to the Emergency Department if you experience worsening symptoms.  Please return if you have any other emergent concerns.  Additional Information:  Your vital signs today were: BP (!) 133/93 (BP Location: Right Arm)    Pulse (!) 103    Temp 98 F (36.7 C) (Oral)    Resp 18    LMP 03/13/2021    SpO2 100%  If your blood pressure (BP) was  elevated above 135/85 this visit, please have this repeated by your doctor within one month. --------------

## 2021-03-17 NOTE — ED Provider Notes (Signed)
[facility] EMERGENCY DEPT Provider Note   CSN: [id number] Arrival date & time: 03/17/21  1634     History  Chief Complaint  Patient presents with   Back Pain    Tina Huber is a 46 y.o. female.  Patient with no past surgical history, history of sickle cell trait and HIV --presents to the emergency department for evaluation of back pain.  Patient reports a trip to [place] for 2 weeks in January.  Upon returning, she developed pain in her right side.  This was mainly flank pain that radiates to the abdomen.  It was worse with movement.  Patient had visits with primary care doctor.  She states that she was given a muscle relaxer and steroids.  She also had x-ray of her lumbar spine, hip, and chest x-ray.  These were unremarkable.  She followed up at an urgent care when symptoms did not improve.  She was given IM steroids and prescribed Valium.  She took this and continues to have pain.  Pain radiates into the right upper quadrant of the abdomen.  No associated fevers, vomiting, diarrhea.  She is not short of breath.  No lower extremity swelling or pain.  No history of blood clots.  No recent surgeries or hormone use.      Home Medications Prior to Admission medications   Medication Sig Start Date End Date Taking? Authorizing Provider  acetaminophen (TYLENOL) 325 MG tablet 2 tablets as needed    [provider]  albuterol (PROVENTIL) (2.5 MG/3ML) 0.083% nebulizer solution Take 3 mLs (2.5 mg total) by nebulization every 4 (four) hours as needed for wheezing or shortness of breath. 07/20/20 07/20/21  Barbette Merino, NP  albuterol (VENTOLIN HFA) 108 (90 Base) MCG/ACT inhaler Inhale 2 puffs into the lungs every 6 (six) hours as needed for wheezing or shortness of breath. 12/09/20   Barbette Merino, NP  celecoxib (CELEBREX) 200 MG capsule Take by mouth 2 (two) times daily. 06/10/20   [provider]  diazepam (VALIUM) 5 MG tablet Take 1 tablet (5 mg total) by mouth  every 12 (twelve) hours as needed for muscle spasms. 03/07/21   Mardella Layman, MD  folic acid (FOLVITE) 1 MG tablet Take 1 mg by mouth daily.    [provider]  gabapentin (NEURONTIN) 300 MG capsule Take 1 capsule (300 mg total) by mouth 3 (three) times daily. 12/09/20 12/09/21  Barbette Merino, NP  Lidocaine 4 % PTCH Apply 1 patch topically daily. 02/17/21   Passmore, Enid Derry I, NP  methocarbamol (ROBAXIN) 500 MG tablet Take 1 tablet (500 mg total) by mouth every 6 (six) hours as needed for muscle spasms. 02/17/21   Orion Crook I, NP  Multiple Vitamins-Minerals (MULTIVITAMIN WITH MINERALS) tablet Take 1 tablet by mouth daily.    [provider]  traMADol (ULTRAM) 50 MG tablet Take 1 tablet (50 mg total) by mouth every 6 (six) hours as needed. 01/09/21   Barbette Merino, NP  Vitamin D, Ergocalciferol, (DRISDOL) 50000 units CAPS capsule Take 1 capsule (50,000 Units total) by mouth every 7 (seven) days. 01/09/17   Bing Neighbors, FNP      Allergies    Latex, Quinine, and Quinine derivatives    Review of Systems   Review of Systems  Physical Exam Updated Vital Signs BP (!) 133/93 (BP Location: Right Arm)    Pulse (!) 103    Temp 98 F (36.7 C) (Oral)    Resp 18  LMP 03/13/2021    SpO2 100%  Physical Exam Vitals and nursing note reviewed.  Constitutional:      General: She is in acute distress.     Appearance: She is well-developed. She is not diaphoretic.     Comments: She appears uncomfortable, tearful at times.  HENT:     Head: Normocephalic and atraumatic.     Mouth/Throat:     Mouth: Mucous membranes are not dry.  Eyes:     Conjunctiva/sclera: Conjunctivae normal.  Neck:     Vascular: Normal carotid pulses. No JVD.     Trachea: Trachea normal. No tracheal deviation.  Cardiovascular:     Rate and Rhythm: Normal rate and regular rhythm.     Pulses: No decreased pulses.          Radial pulses are 2+ on the right side and 2+ on the left side.     Heart  sounds: Normal heart sounds, S1 normal and S2 normal. No murmur heard. Pulmonary:     Effort: Pulmonary effort is normal. No respiratory distress.     Breath sounds: No wheezing.  Chest:     Chest wall: No tenderness.  Abdominal:     General: Bowel sounds are normal.     Palpations: Abdomen is soft.     Tenderness: There is abdominal tenderness. There is no guarding or rebound.     Comments: Mild tenderness over the right upper quadrant and right flank into the right middle back  Musculoskeletal:        General: Normal range of motion.     Cervical back: Normal range of motion and neck supple. No muscular tenderness.     Right lower leg: No edema.     Left lower leg: No edema.  Skin:    General: Skin is warm and dry.     Coloration: Skin is not pale.  Neurological:     Mental Status: She is alert.    ED Results / Procedures / Treatments   Labs (all labs ordered are listed, but only abnormal results are displayed) Labs Reviewed  CBC WITH DIFFERENTIAL/PLATELET - Abnormal; Notable for the following components:      Result Value   Hemoglobin 11.9 (*)    HCT 35.0 (*)    Abs Immature Granulocytes 0.11 (*)    All other components within normal limits  COMPREHENSIVE METABOLIC PANEL - Abnormal; Notable for the following components:   AST 11 (*)    All other components within normal limits  URINALYSIS, ROUTINE W REFLEX MICROSCOPIC - Abnormal; Notable for the following components:   Color, Urine COLORLESS (*)    Hgb urine dipstick MODERATE (*)    All other components within normal limits  D-DIMER, QUANTITATIVE    EKG None  Radiology US Abdomen Limited RUQ (LIVER/GB)  Result Date: 03/17/2021 CLINICAL DATA:  Right-sided back pain x1 month. EXAM: ULTRASOUND ABDOMEN LIMITED RIGHT UPPER QUADRANT COMPARISON:  None. FINDINGS: Gallbladder: No gallstones or wall thickening visualized (1.8 mm). No sonographic Murphy sign noted by sonographer. Common bile duct: Diameter: 2.1 mm Liver: No  focal lesion identified. Within normal limits in parenchymal echogenicity. Portal vein is patent on color Doppler imaging with normal direction of blood flow towards the liver. Other: None. IMPRESSION: Normal right upper quadrant ultrasound. Electronically Signed   By: Aram Candela M.D.   On: 03/17/2021 20:15    Procedures Procedures    Medications Ordered in ED Medications  oxyCODONE-acetaminophen (PERCOCET/ROXICET) 5-325 MG per tablet 1 tablet (has  no administration in time range)    ED Course/ Medical Decision Making/ A&P    Patient seen and examined. History obtained directly from patient as well as previous PCP and urgent care notes.  Reviewed previous imaging results.   After discussion with patient, feel would be appropriate to broaden differential given limited improvement with other treatment allergies at this point.  May be musculoskeletal in nature or neuropathic, but would like to evaluate the patient's gallbladder and rule out the possibility of PE given her flight to [place] with symptoms starting shortly afterwards.  She is in agreement.  Labs/EKG: Ordered CBC, CMP, D-dimer, UA.  Patient is aware that if D-dimer is elevated, she will need CT of the chest.  Imaging: None ordered  Medications/Fluids: None ordered.  Most recent vital signs reviewed and are as follows: BP (!) 133/93 (BP Location: Right Arm)    Pulse (!) 103    Temp 98 F (36.7 C) (Oral)    Resp 18    LMP 03/13/2021    SpO2 100%   Initial impression: Right sided thoracic pain with radiation to the flank and upper quadrant of the abdomen.  10:25 PM Reassessment performed. Patient appears mildly uncomfortable.  Dose of oral Percocet ordered prior to discharge.  Patient has a ride home.  Labs and imaging personally reviewed and interpreted including: CBC with minimal anemia, normal white blood cell count; unremarkable CMP; UA with moderate hemoglobin on dipstick with 0-5 red cells; negative D-dimer.   Ultrasound result reviewed and negative.  Reviewed additional pertinent lab work and imaging with patient at bedside including: Reassuring work-up.  Most current vital signs reviewed and are as follows: BP 128/89    Pulse (!) 106    Temp 98 F (36.7 C) (Oral)    Resp 18    LMP 03/13/2021    SpO2 100%   Plan: Discharge to home  Home treatment: Prescription written for Percocet, Robaxin.  Patient encouraged to follow-up with her PCP early next week and sports medicine as previously referred.  Patient counseled on use of narcotic pain medications. Counseled not to combine these medications with others containing tylenol. Urged not to drink alcohol, drive, or perform any other activities that requires focus while taking these medications. The patient verbalizes understanding and agrees with the plan.  Return and follow-up instructions: Encouraged return to ED with uncontrolled pain, persistent vomiting, difficulty breathing. Encouraged patient to follow-up with their provider in 3 days. Patient verbalized understanding and agreed with plan.                             Medical Decision Making Amount and/or Complexity of Data Reviewed Labs: ordered. Radiology: ordered.  Risk Prescription drug management.   Patient with right-sided flank and back pain ongoing for over a month now.  She has had negative x-rays and poor results to treatment for radiculopathy and lower back strain to this point.  Expanded work-up tonight evaluating gallbladder, possibility of PE.  Fortunately work-up was reassuring.  Patient continues to have some pain.  It is subacute at this point.  She will benefit from continued work-up and evaluation.  Low concern for neurosurgical emergency.  Low concern for other serious intra-abdominal etiology.  White blood cell count is normal.  UA without signs of compelling infection.  We will continue symptomatic treatment.        Final Clinical Impression(s) / ED  Diagnoses Final diagnoses:  Right flank pain  Rx / DC Orders ED Discharge Orders          Ordered    oxyCODONE-acetaminophen (PERCOCET/ROXICET) 5-325 MG tablet  Every 6 hours PRN        03/17/21 2203    methocarbamol (ROBAXIN) 500 MG tablet  4 times daily        03/17/21 2203              Renne CriglerGeiple, Monserrat Vidaurri, PA-C 03/17/21 2229    Edwin DadaGray, Alicia P, DO 03/19/21 1050

## 2021-03-29 ENCOUNTER — Other Ambulatory Visit: Payer: Self-pay

## 2021-03-29 ENCOUNTER — Encounter: Payer: Self-pay | Admitting: Nurse Practitioner

## 2021-03-29 ENCOUNTER — Ambulatory Visit: Payer: Managed Care, Other (non HMO) | Admitting: Nurse Practitioner

## 2021-03-29 VITALS — BP 130/91 | HR 100 | Temp 98.3°F | Ht 65.0 in | Wt 170.0 lb

## 2021-03-29 DIAGNOSIS — M5441 Lumbago with sciatica, right side: Secondary | ICD-10-CM

## 2021-03-29 DIAGNOSIS — G8929 Other chronic pain: Secondary | ICD-10-CM

## 2021-03-29 MED ORDER — OXYCODONE-ACETAMINOPHEN 5-325 MG PO TABS: 1.0000 | ORAL_TABLET | Freq: Four times a day (QID) | ORAL | 0 refills | Status: DC | PRN

## 2021-03-29 NOTE — Patient Instructions (Signed)
Radicular Pain ?Radicular pain is a type of pain that spreads from your back or neck along a spinal nerve. Spinal nerves are nerves that leave the spinal cord and go to the muscles. Radicular pain is sometimes called radiculopathy, radiculitis, or a pinched nerve. When you have this type of pain, you may also have weakness, numbness, or tingling in the area of your body that is supplied by the nerve. The pain may feel sharp and burning. Depending on which spinal nerve is affected, the pain may occur in the: ?Neck area (cervical radicular pain). You may also feel pain, numbness, weakness, or tingling in the arms. ?Mid-spine area (thoracic radicular pain). You would feel this pain in the back and chest. This type is rare. ?Lower back area (lumbar radicular pain). You would feel this pain as low back pain. You may feel pain, numbness, weakness, or tingling in the buttocks or legs. Sciatica is a type of lumbar radicular pain that shoots down the back of the leg. ?Radicular pain occurs when one of the spinal nerves becomes irritated or squeezed (compressed). It is often caused by something pushing on a spinal nerve, such as one of the bones of the spine (vertebrae) or one of the round cushions between vertebrae (intervertebral disks). This can result from: ?An injury. ?Wear and tear or aging of a disk. ?The growth of a bone spur that pushes on the nerve. ?Radicular pain often goes away when you follow instructions from your health care provider for relieving pain at home. ?How is this treated? ?Treatment may depend on the cause of the condition and may include: ?Working with a physical therapist. ?Taking pain medicine. ?Applying heat or ice or both to the affected areas. ?Doing stretches to improve flexibility. ?Having surgery. This may be needed if other treatments do not help. Different types of surgery may be done depending on the cause of this condition. ?Follow these instructions at home: ?Managing pain ?  ?If  directed, put ice on the affected area. To do this: ?Put ice in a plastic bag. ?Place a towel between your skin and the bag. ?Leave the ice on for 20 minutes, 2-3 times a day. ?Remove the ice if your skin turns bright red. This is very important. If you cannot feel pain, heat, or cold, you have a greater risk of damage to the area. ?If directed, apply heat to the affected area as often as told by your health care provider. Use the heat source that your health care provider recommends, such as a moist heat pack or a heating pad. ?Place a towel between your skin and the heat source. ?Leave the heat on for 20-30 minutes. ?Remove the heat if your skin turns bright red. This is especially important if you are unable to feel pain, heat, or cold. You have a greater risk of getting burned. ?Activity ?Do not sit or rest in bed for long periods of time. ?Try to stay as active as possible. Ask your health care provider what type of exercise or activity is best for you. ?Avoid activities that make your pain worse, such as bending and lifting. ?You may have to avoid lifting. Ask your health care provider how much you can safely lift. ?Practice using proper technique when lifting items. Proper lifting technique involves bending your knees and rising up. ?Do strength and range-of-motion exercises only as told by your health care provider or physical therapist. ?General instructions ?Take over-the-counter and prescription medicines only as told by your health care   provider. ?Pay attention to any changes in your symptoms. ?Keep all follow-up visits. This is important. ?Contact a health care provider if: ?Your pain and other symptoms get worse. ?Your pain medicine is not helping. ?Your pain has not improved after a few weeks of home care. ?You have a fever. ?Get help right away if: ?You have severe pain, weakness, or numbness. ?You have difficulty with bladder or bowel control. ?Summary ?Radicular pain is a type of pain that spreads  from your back or neck along a spinal nerve. ?When you have radicular pain, you may also have weakness, numbness, or tingling in the area of your body that is supplied by the nerve. ?The pain may feel sharp or burning. ?Radicular pain may be treated with ice, heat, medicines, or physical therapy. ?This information is not intended to replace advice given to you by your health care provider. Make sure you discuss any questions you have with your health care provider. ?Document Revised: 07/21/2020 Document Reviewed: 07/21/2020 ?Elsevier Patient Education ? 2022 Elsevier Inc. ? ?
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
## 2021-03-30 ENCOUNTER — Other Ambulatory Visit: Payer: Self-pay

## 2021-03-30 DIAGNOSIS — B20 Human immunodeficiency virus [HIV] disease: Secondary | ICD-10-CM

## 2021-04-03 ENCOUNTER — Other Ambulatory Visit: Payer: Managed Care, Other (non HMO)

## 2021-04-07 ENCOUNTER — Telehealth: Payer: Self-pay

## 2021-04-07 NOTE — Telephone Encounter (Signed)
needs prior auth for MRI. [employer] called and said they don't have anything ?

## 2021-04-10 ENCOUNTER — Other Ambulatory Visit: Payer: Managed Care, Other (non HMO)

## 2021-04-11 ENCOUNTER — Other Ambulatory Visit: Payer: Self-pay

## 2021-04-11 ENCOUNTER — Other Ambulatory Visit: Payer: Managed Care, Other (non HMO)

## 2021-04-11 DIAGNOSIS — B20 Human immunodeficiency virus [HIV] disease: Secondary | ICD-10-CM

## 2021-04-11 NOTE — Telephone Encounter (Signed)
Prior authorization was done and we are waiting on an approval from her insurance company. Patient is aware and was told that we will contact her once her appt is scheduled if approved. ?

## 2021-04-13 ENCOUNTER — Other Ambulatory Visit: Payer: Self-pay | Admitting: Nurse Practitioner

## 2021-04-13 NOTE — Progress Notes (Signed)
   [facility] [address], [place]  [address] Phone:  [phone]   Fax:  [phone] 

## 2021-04-13 NOTE — Telephone Encounter (Signed)
Patient states that she would like to do Physical Therapy to see if that would help.  ?

## 2021-04-14 LAB — T-HELPER CELLS (CD4) COUNT (NOT AT ARMC)
Absolute CD4: 773 cells/uL (ref 490–1740)
CD4 T Helper %: 29 % — ABNORMAL LOW (ref 30–61)
Total lymphocyte count: 2668 cells/uL (ref 850–3900)

## 2021-04-14 LAB — HIV-1 RNA QUANT-NO REFLEX-BLD
HIV 1 RNA Quant: NOT DETECTED Copies/mL
HIV-1 RNA Quant, Log: NOT DETECTED Log cps/mL

## 2021-04-18 ENCOUNTER — Other Ambulatory Visit: Payer: Self-pay

## 2021-04-18 ENCOUNTER — Ambulatory Visit: Payer: Managed Care, Other (non HMO) | Admitting: Internal Medicine

## 2021-04-18 ENCOUNTER — Encounter: Payer: Self-pay | Admitting: Internal Medicine

## 2021-04-18 VITALS — BP 128/90 | HR 102 | Temp 97.9°F | Wt 170.0 lb

## 2021-04-18 DIAGNOSIS — Z21 Asymptomatic human immunodeficiency virus [HIV] infection status: Secondary | ICD-10-CM | POA: Diagnosis not present

## 2021-04-18 DIAGNOSIS — G8929 Other chronic pain: Secondary | ICD-10-CM | POA: Diagnosis not present

## 2021-04-18 DIAGNOSIS — M545 Low back pain, unspecified: Secondary | ICD-10-CM

## 2021-04-18 NOTE — Assessment & Plan Note (Signed)
She is working with physical therapy and will continue.  ?

## 2021-04-18 NOTE — Progress Notes (Signed)
? ?  Subjective:  ? ? Patient ID: Tina Huber, female    DOB: 1975-12-31, 46 y.o.   MRN: [id number] ? ?HPI ?She is here for follow up of HIV ?She is a long term non-progressor and no new issues.  CD4 773 and the viral load is not detected again.  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   ?She has had some back pain and is in physical therapy for this.  She may need an MRI if that is not effective.  No issues otherwise.  ? ?Review of Systems  ?Gastrointestinal:  Negative for nausea.  ?Skin:  Negative for rash.  ? ?   ?Objective:  ? Physical Exam ?Constitutional:   ?   Appearance: Normal appearance.  ?Eyes:  ?   General: No scleral icterus. ?Pulmonary:  ?   Effort: Pulmonary effort is normal.  ?Neurological:  ?   General: No focal deficit present.  ?Psychiatric:     ?   Mood and Affect: Mood normal.  ? ?SH: no tobacco ? ? ? ?   ?Assessment & Plan:  ? ? ?

## 2021-04-18 NOTE — Assessment & Plan Note (Signed)
She is a long term non-progressor with no new issues.  Viral load remains suppressed and CD4 remains in the normal range.   ?Will continue to monitor every 6 months.  ?
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
## 2021-05-10 ENCOUNTER — Ambulatory Visit: Payer: Managed Care, Other (non HMO) | Admitting: Physical Therapy
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
## 2021-05-17 ENCOUNTER — Ambulatory Visit: Payer: Managed Care, Other (non HMO) | Attending: Nurse Practitioner

## 2021-05-17 DIAGNOSIS — M5441 Lumbago with sciatica, right side: Secondary | ICD-10-CM | POA: Insufficient documentation

## 2021-05-17 DIAGNOSIS — R2689 Other abnormalities of gait and mobility: Secondary | ICD-10-CM | POA: Insufficient documentation

## 2021-05-17 DIAGNOSIS — G8929 Other chronic pain: Secondary | ICD-10-CM | POA: Insufficient documentation

## 2021-05-17 DIAGNOSIS — M6281 Muscle weakness (generalized): Secondary | ICD-10-CM | POA: Insufficient documentation

## 2021-05-24 ENCOUNTER — Ambulatory Visit: Payer: Managed Care, Other (non HMO)

## 2021-05-24 DIAGNOSIS — G8929 Other chronic pain: Secondary | ICD-10-CM

## 2021-05-24 DIAGNOSIS — M6281 Muscle weakness (generalized): Secondary | ICD-10-CM

## 2021-05-24 DIAGNOSIS — R2689 Other abnormalities of gait and mobility: Secondary | ICD-10-CM

## 2021-05-24 DIAGNOSIS — M5441 Lumbago with sciatica, right side: Secondary | ICD-10-CM | POA: Diagnosis not present
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
## 2021-05-26 ENCOUNTER — Other Ambulatory Visit: Payer: Self-pay | Admitting: Nurse Practitioner

## 2021-05-26 ENCOUNTER — Ambulatory Visit: Payer: Managed Care, Other (non HMO)

## 2021-05-29 ENCOUNTER — Ambulatory Visit: Payer: Managed Care, Other (non HMO) | Admitting: Physical Therapy
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
## 2021-06-05 ENCOUNTER — Ambulatory Visit: Payer: Managed Care, Other (non HMO) | Admitting: Physical Therapy
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
## 2021-06-06 ENCOUNTER — Ambulatory Visit: Payer: Managed Care, Other (non HMO) | Attending: Nurse Practitioner | Admitting: Physical Therapy

## 2021-06-06 ENCOUNTER — Encounter: Payer: Self-pay | Admitting: Physical Therapy

## 2021-06-06 DIAGNOSIS — G8929 Other chronic pain: Secondary | ICD-10-CM | POA: Diagnosis present

## 2021-06-06 DIAGNOSIS — M5441 Lumbago with sciatica, right side: Secondary | ICD-10-CM | POA: Insufficient documentation

## 2021-06-06 DIAGNOSIS — M6281 Muscle weakness (generalized): Secondary | ICD-10-CM | POA: Insufficient documentation

## 2021-06-06 DIAGNOSIS — R2689 Other abnormalities of gait and mobility: Secondary | ICD-10-CM | POA: Insufficient documentation

## 2021-06-12 ENCOUNTER — Ambulatory Visit: Payer: Managed Care, Other (non HMO) | Admitting: Physical Therapy
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
## 2021-06-14 ENCOUNTER — Ambulatory Visit: Payer: Managed Care, Other (non HMO) | Admitting: Physical Therapy

## 2021-06-14 ENCOUNTER — Ambulatory Visit: Payer: Managed Care, Other (non HMO) | Admitting: Nurse Practitioner

## 2021-06-29 ENCOUNTER — Encounter: Payer: Self-pay | Admitting: Nurse Practitioner

## 2021-06-29 ENCOUNTER — Ambulatory Visit: Payer: Managed Care, Other (non HMO) | Admitting: Nurse Practitioner

## 2021-06-29 VITALS — BP 140/106 | HR 116 | Resp 18 | Ht 59.0 in | Wt 171.6 lb

## 2021-06-29 DIAGNOSIS — E669 Obesity, unspecified: Secondary | ICD-10-CM

## 2021-06-29 DIAGNOSIS — G8929 Other chronic pain: Secondary | ICD-10-CM | POA: Diagnosis not present

## 2021-06-29 DIAGNOSIS — M545 Low back pain, unspecified: Secondary | ICD-10-CM | POA: Diagnosis not present

## 2021-06-29 DIAGNOSIS — Z0000 Encounter for general adult medical examination without abnormal findings: Secondary | ICD-10-CM

## 2021-06-29 LAB — POCT URINALYSIS DIP (CLINITEK)
Bilirubin, UA: NEGATIVE
Blood, UA: NEGATIVE
Glucose, UA: NEGATIVE mg/dL
Ketones, POC UA: NEGATIVE mg/dL
Leukocytes, UA: NEGATIVE
Nitrite, UA: NEGATIVE
POC PROTEIN,UA: NEGATIVE
Spec Grav, UA: 1.01 (ref 1.010–1.025)
Urobilinogen, UA: 0.2 E.U./dL
pH, UA: 5.5 (ref 5.0–8.0)

## 2021-06-29 NOTE — Patient Instructions (Addendum)
1. Chronic bilateral low back pain without sciatica  - Ambulatory referral to Physical Medicine Rehab - POCT URINALYSIS DIP (CLINITEK)  2. Routine health maintenance  - Lipid Panel - CBC - Comprehensive metabolic panel - POCT URINALYSIS DIP (CLINITEK)  3. Obesity (BMI 30.0-34.9)  - Amb Ref to Medical Weight Management     Follow up:  Follow up in 6 months or sooner if needed    Acute Back Pain, Adult Acute back pain is sudden and usually short-lived. It is often caused by an injury to the muscles and tissues in the back. The injury may result from: A muscle, tendon, or ligament getting overstretched or torn. Ligaments are tissues that connect bones to each other. Lifting something improperly can cause a back strain. Wear and tear (degeneration) of the spinal disks. Spinal disks are circular tissue that provide cushioning between the bones of the spine (vertebrae). Twisting motions, such as while playing sports or doing yard work. A hit to the back. Arthritis. You may have a physical exam, lab tests, and imaging tests to find the cause of your pain. Acute back pain usually goes away with rest and home care. Follow these instructions at home: Managing pain, stiffness, and swelling Take over-the-counter and prescription medicines only as told by your health care provider. Treatment may include medicines for pain and inflammation that are taken by mouth or applied to the skin, or muscle relaxants. Your health care provider may recommend applying ice during the first 24-48 hours after your pain starts. To do this: Put ice in a plastic bag. Place a towel between your skin and the bag. Leave the ice on for 20 minutes, 2-3 times a day. Remove the ice if your skin turns bright red. This is very important. If you cannot feel pain, heat, or cold, you have a greater risk of damage to the area. If directed, apply heat to the affected area as often as told by your health care provider. Use  the heat source that your health care provider recommends, such as a moist heat pack or a heating pad. Place a towel between your skin and the heat source. Leave the heat on for 20-30 minutes. Remove the heat if your skin turns bright red. This is especially important if you are unable to feel pain, heat, or cold. You have a greater risk of getting burned. Activity  Do not stay in bed. Staying in bed for more than 1-2 days can delay your recovery. Sit up and stand up straight. Avoid leaning forward when you sit or hunching over when you stand. If you work at a desk, sit close to it so you do not need to lean over. Keep your chin tucked in. Keep your neck drawn back, and keep your elbows bent at a 90-degree angle (right angle). Sit high and close to the steering wheel when you drive. Add lower back (lumbar) support to your car seat, if needed. Take short walks on even surfaces as soon as you are able. Try to increase the length of time you walk each day. Do not sit, drive, or stand in one place for more than 30 minutes at a time. Sitting or standing for long periods of time can put stress on your back. Do not drive or use heavy machinery while taking prescription pain medicine. Use proper lifting techniques. When you bend and lift, use positions that put less stress on your back: Urbank your knees. Keep the load close to your body. Avoid  twisting. Exercise regularly as told by your health care provider. Exercising helps your back heal faster and helps prevent back injuries by keeping muscles strong and flexible. Work with a physical therapist to make a safe exercise program, as recommended by your health care provider. Do any exercises as told by your physical therapist. Lifestyle Maintain a healthy weight. Extra weight puts stress on your back and makes it difficult to have good posture. Avoid activities or situations that make you feel anxious or stressed. Stress and anxiety increase muscle  tension and can make back pain worse. Learn ways to manage anxiety and stress, such as through exercise. General instructions Sleep on a firm mattress in a comfortable position. Try lying on your side with your knees slightly bent. If you lie on your back, put a pillow under your knees. Keep your head and neck in a straight line with your spine (neutral position) when using electronic equipment like smartphones or pads. To do this: Raise your smartphone or pad to look at it instead of bending your head or neck to look down. Put the smartphone or pad at the level of your face while looking at the screen. Follow your treatment plan as told by your health care provider. This may include: Cognitive or behavioral therapy. Acupuncture or massage therapy. Meditation or yoga. Contact a health care provider if: You have pain that is not relieved with rest or medicine. You have increasing pain going down into your legs or buttocks. Your pain does not improve after 2 weeks. You have pain at night. You lose weight without trying. You have a fever or chills. You develop nausea or vomiting. You develop abdominal pain. Get help right away if: You develop new bowel or bladder control problems. You have unusual weakness or numbness in your arms or legs. You feel faint. These symptoms may represent a serious problem that is an emergency. Do not wait to see if the symptoms will go away. Get medical help right away. Call your local emergency services (911 in the [place] Do not drive yourself to the hospital. Summary Acute back pain is sudden and usually short-lived. Use proper lifting techniques. When you bend and lift, use positions that put less stress on your back. Take over-the-counter and prescription medicines only as told by your health care provider, and apply heat or ice as told. This information is not intended to replace advice given to you by your health care provider. Make sure you discuss any  questions you have with your health care provider. Document Revised: 04/08/2020 Document Reviewed: 04/08/2020 Elsevier Patient Education  2023 [employer]
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
## 2021-06-29 NOTE — Assessment & Plan Note (Signed)
-   Ambulatory referral to Physical Medicine Rehab - POCT URINALYSIS DIP (CLINITEK)  2. Routine health maintenance  - Lipid Panel - CBC - Comprehensive metabolic panel - POCT URINALYSIS DIP (CLINITEK)  3. Obesity (BMI 30.0-34.9)  - Amb Ref to Medical Weight Management     Follow up:  Follow up in 6 months or sooner if needed

## 2021-06-30 ENCOUNTER — Other Ambulatory Visit: Payer: Self-pay | Admitting: Nurse Practitioner

## 2021-06-30 DIAGNOSIS — E7800 Pure hypercholesterolemia, unspecified: Secondary | ICD-10-CM

## 2021-06-30 LAB — COMPREHENSIVE METABOLIC PANEL
ALT: 34 IU/L — ABNORMAL HIGH (ref 0–32)
AST: 32 IU/L (ref 0–40)
Albumin/Globulin Ratio: 1.3 (ref 1.2–2.2)
Albumin: 4.7 g/dL (ref 3.8–4.8)
Alkaline Phosphatase: 82 IU/L (ref 44–121)
BUN/Creatinine Ratio: 10 (ref 9–23)
BUN: 8 mg/dL (ref 6–24)
Bilirubin Total: 0.5 mg/dL (ref 0.0–1.2)
CO2: 22 mmol/L (ref 20–29)
Calcium: 9.4 mg/dL (ref 8.7–10.2)
Chloride: 97 mmol/L (ref 96–106)
Creatinine, Ser: 0.81 mg/dL (ref 0.57–1.00)
Globulin, Total: 3.7 g/dL (ref 1.5–4.5)
Glucose: 126 mg/dL — ABNORMAL HIGH (ref 70–99)
Potassium: 4.1 mmol/L (ref 3.5–5.2)
Sodium: 135 mmol/L (ref 134–144)
Total Protein: 8.4 g/dL (ref 6.0–8.5)
eGFR: 92 mL/min/{1.73_m2} (ref >59)

## 2021-06-30 LAB — LIPID PANEL
Chol/HDL Ratio: 3.3 ratio (ref 0.0–4.4)
Cholesterol, Total: 203 mg/dL — ABNORMAL HIGH (ref 100–199)
HDL: 61 mg/dL (ref >39)
LDL Chol Calc (NIH): 115 mg/dL — ABNORMAL HIGH (ref 0–99)
Triglycerides: 153 mg/dL — ABNORMAL HIGH (ref 0–149)
VLDL Cholesterol Cal: 27 mg/dL (ref 5–40)

## 2021-06-30 LAB — CBC
Hematocrit: 39 % (ref 34.0–46.6)
Hemoglobin: 13.7 g/dL (ref 11.1–15.9)
MCH: 29.7 pg (ref 26.6–33.0)
MCHC: 35.1 g/dL (ref 31.5–35.7)
MCV: 85 fL (ref 79–97)
Platelets: 281 10*3/uL (ref 150–450)
RBC: 4.61 x10E6/uL (ref 3.77–5.28)
RDW: 13.5 % (ref 11.7–15.4)
WBC: 4.6 10*3/uL (ref 3.4–10.8)

## 2021-06-30 MED ORDER — ATORVASTATIN CALCIUM 10 MG PO TABS: 10.0000 mg | ORAL_TABLET | Freq: Every day | ORAL | 11 refills | Status: DC

## 2021-07-03 ENCOUNTER — Other Ambulatory Visit: Payer: Self-pay

## 2021-07-03 DIAGNOSIS — E7800 Pure hypercholesterolemia, unspecified: Secondary | ICD-10-CM

## 2021-07-03 DIAGNOSIS — E669 Obesity, unspecified: Secondary | ICD-10-CM

## 2021-07-06 ENCOUNTER — Other Ambulatory Visit: Payer: Self-pay | Admitting: Nurse Practitioner

## 2021-07-06 ENCOUNTER — Encounter: Payer: Self-pay | Admitting: Physical Medicine & Rehabilitation

## 2021-07-06 DIAGNOSIS — E669 Obesity, unspecified: Secondary | ICD-10-CM

## 2021-07-11 ENCOUNTER — Ambulatory Visit: Payer: Managed Care, Other (non HMO) | Attending: Nurse Practitioner

## 2021-07-11 ENCOUNTER — Telehealth: Payer: Self-pay

## 2021-07-11 DIAGNOSIS — M6281 Muscle weakness (generalized): Secondary | ICD-10-CM | POA: Insufficient documentation

## 2021-07-11 DIAGNOSIS — R2689 Other abnormalities of gait and mobility: Secondary | ICD-10-CM | POA: Insufficient documentation

## 2021-07-11 DIAGNOSIS — M5441 Lumbago with sciatica, right side: Secondary | ICD-10-CM | POA: Insufficient documentation

## 2021-07-11 DIAGNOSIS — G8929 Other chronic pain: Secondary | ICD-10-CM | POA: Insufficient documentation
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
## 2021-07-13 ENCOUNTER — Other Ambulatory Visit: Payer: Self-pay | Admitting: Family Medicine

## 2021-07-13 ENCOUNTER — Ambulatory Visit: Payer: Managed Care, Other (non HMO) | Admitting: Nurse Practitioner

## 2021-07-13 ENCOUNTER — Other Ambulatory Visit: Payer: Self-pay

## 2021-07-13 ENCOUNTER — Ambulatory Visit: Payer: Managed Care, Other (non HMO)

## 2021-07-13 DIAGNOSIS — M5441 Lumbago with sciatica, right side: Secondary | ICD-10-CM | POA: Diagnosis present

## 2021-07-13 DIAGNOSIS — R2689 Other abnormalities of gait and mobility: Secondary | ICD-10-CM

## 2021-07-13 DIAGNOSIS — M6281 Muscle weakness (generalized): Secondary | ICD-10-CM

## 2021-07-13 DIAGNOSIS — G8929 Other chronic pain: Secondary | ICD-10-CM

## 2021-07-13 MED ORDER — OXYCODONE-ACETAMINOPHEN 5-325 MG PO TABS
1.0000 | ORAL_TABLET | Freq: Four times a day (QID) | ORAL | 0 refills | Status: DC | PRN
Start: 2021-07-13 — End: 2021-08-24
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
## 2021-07-20 ENCOUNTER — Ambulatory Visit: Payer: Managed Care, Other (non HMO)

## 2021-07-20 DIAGNOSIS — M5441 Lumbago with sciatica, right side: Secondary | ICD-10-CM | POA: Diagnosis not present

## 2021-07-20 DIAGNOSIS — M6281 Muscle weakness (generalized): Secondary | ICD-10-CM

## 2021-07-20 DIAGNOSIS — G8929 Other chronic pain: Secondary | ICD-10-CM

## 2021-07-20 DIAGNOSIS — R2689 Other abnormalities of gait and mobility: Secondary | ICD-10-CM

## 2021-07-27 ENCOUNTER — Inpatient Hospital Stay (HOSPITAL_COMMUNITY): Payer: Managed Care, Other (non HMO)

## 2021-07-27 ENCOUNTER — Encounter (HOSPITAL_COMMUNITY): Payer: Self-pay | Admitting: Emergency Medicine

## 2021-07-27 ENCOUNTER — Inpatient Hospital Stay (HOSPITAL_COMMUNITY)
Admission: EM | Admit: 2021-07-27 | Discharge: 2021-07-31 | DRG: 872 | Disposition: A | Discharge status: Home / Self Care | Payer: Managed Care, Other (non HMO) | Attending: Internal Medicine | Admitting: Internal Medicine

## 2021-07-27 ENCOUNTER — Other Ambulatory Visit: Payer: Self-pay

## 2021-07-27 ENCOUNTER — Ambulatory Visit: Payer: Managed Care, Other (non HMO)

## 2021-07-27 ENCOUNTER — Emergency Department (HOSPITAL_COMMUNITY): Payer: Managed Care, Other (non HMO)

## 2021-07-27 DIAGNOSIS — E669 Obesity, unspecified: Secondary | ICD-10-CM | POA: Diagnosis present

## 2021-07-27 DIAGNOSIS — Z79899 Other long term (current) drug therapy: Secondary | ICD-10-CM

## 2021-07-27 DIAGNOSIS — R109 Unspecified abdominal pain: Secondary | ICD-10-CM | POA: Diagnosis not present

## 2021-07-27 DIAGNOSIS — F4310 Post-traumatic stress disorder, unspecified: Secondary | ICD-10-CM | POA: Diagnosis present

## 2021-07-27 DIAGNOSIS — R509 Fever, unspecified: Secondary | ICD-10-CM | POA: Diagnosis present

## 2021-07-27 DIAGNOSIS — A419 Sepsis, unspecified organism: Secondary | ICD-10-CM | POA: Diagnosis present

## 2021-07-27 DIAGNOSIS — R9431 Abnormal electrocardiogram [ECG] [EKG]: Secondary | ICD-10-CM | POA: Diagnosis not present

## 2021-07-27 DIAGNOSIS — Z21 Asymptomatic human immunodeficiency virus [HIV] infection status: Secondary | ICD-10-CM | POA: Diagnosis present

## 2021-07-27 DIAGNOSIS — D571 Sickle-cell disease without crisis: Secondary | ICD-10-CM | POA: Diagnosis present

## 2021-07-27 DIAGNOSIS — G894 Chronic pain syndrome: Secondary | ICD-10-CM | POA: Diagnosis present

## 2021-07-27 DIAGNOSIS — Z833 Family history of diabetes mellitus: Secondary | ICD-10-CM | POA: Diagnosis not present

## 2021-07-27 DIAGNOSIS — R339 Retention of urine, unspecified: Secondary | ICD-10-CM | POA: Diagnosis not present

## 2021-07-27 DIAGNOSIS — Z6830 Body mass index (BMI) 30.0-30.9, adult: Secondary | ICD-10-CM | POA: Diagnosis not present

## 2021-07-27 DIAGNOSIS — M069 Rheumatoid arthritis, unspecified: Secondary | ICD-10-CM | POA: Diagnosis present

## 2021-07-27 DIAGNOSIS — B338 Other specified viral diseases: Secondary | ICD-10-CM | POA: Diagnosis present

## 2021-07-27 DIAGNOSIS — Z823 Family history of stroke: Secondary | ICD-10-CM | POA: Diagnosis not present

## 2021-07-27 DIAGNOSIS — K5900 Constipation, unspecified: Secondary | ICD-10-CM | POA: Diagnosis present

## 2021-07-27 DIAGNOSIS — R6520 Severe sepsis without septic shock: Secondary | ICD-10-CM | POA: Diagnosis present

## 2021-07-27 DIAGNOSIS — Z20822 Contact with and (suspected) exposure to covid-19: Secondary | ICD-10-CM | POA: Diagnosis present

## 2021-07-27 DIAGNOSIS — B20 Human immunodeficiency virus [HIV] disease: Secondary | ICD-10-CM | POA: Diagnosis not present

## 2021-07-27 LAB — COMPREHENSIVE METABOLIC PANEL
ALT: 62 U/L — ABNORMAL HIGH (ref 0–44)
AST: 64 U/L — ABNORMAL HIGH (ref 15–41)
Albumin: 3.8 g/dL (ref 3.5–5.0)
Alkaline Phosphatase: 89 U/L (ref 38–126)
Anion gap: 15 (ref 5–15)
BUN: 6 mg/dL (ref 6–20)
CO2: 25 mmol/L (ref 22–32)
Calcium: 9.3 mg/dL (ref 8.9–10.3)
Chloride: 96 mmol/L — ABNORMAL LOW (ref 98–111)
Creatinine, Ser: 0.86 mg/dL (ref 0.44–1.00)
GFR, Estimated: >60 mL/min (ref >60)
Glucose, Bld: 186 mg/dL — ABNORMAL HIGH (ref 70–99)
Potassium: 3.9 mmol/L (ref 3.5–5.1)
Sodium: 136 mmol/L (ref 135–145)
Total Bilirubin: 0.5 mg/dL (ref 0.3–1.2)
Total Protein: 7.8 g/dL (ref 6.5–8.1)

## 2021-07-27 LAB — CBC WITH DIFFERENTIAL/PLATELET
Abs Immature Granulocytes: 0.07 10*3/uL (ref 0.00–0.07)
Basophils Absolute: 0 10*3/uL (ref 0.0–0.1)
Basophils Relative: 0 %
Eosinophils Absolute: 0 10*3/uL (ref 0.0–0.5)
Eosinophils Relative: 0 %
HCT: 37 % (ref 36.0–46.0)
Hemoglobin: 12.5 g/dL (ref 12.0–15.0)
Immature Granulocytes: 1 %
Lymphocytes Relative: 10 %
Lymphs Abs: 1.3 10*3/uL (ref 0.7–4.0)
MCH: 29.3 pg (ref 26.0–34.0)
MCHC: 33.8 g/dL (ref 30.0–36.0)
MCV: 86.7 fL (ref 80.0–100.0)
Monocytes Absolute: 0.4 10*3/uL (ref 0.1–1.0)
Monocytes Relative: 3 %
Neutro Abs: 11.7 10*3/uL — ABNORMAL HIGH (ref 1.7–7.7)
Neutrophils Relative %: 86 %
Platelets: 243 10*3/uL (ref 150–400)
RBC: 4.27 MIL/uL (ref 3.87–5.11)
RDW: 13.7 % (ref 11.5–15.5)
WBC: 13.5 10*3/uL — ABNORMAL HIGH (ref 4.0–10.5)
nRBC: 0 % (ref 0.0–0.2)

## 2021-07-27 LAB — URINALYSIS, ROUTINE W REFLEX MICROSCOPIC
Bilirubin Urine: NEGATIVE
Glucose, UA: NEGATIVE mg/dL
Hgb urine dipstick: NEGATIVE
Ketones, ur: NEGATIVE mg/dL
Leukocytes,Ua: NEGATIVE
Nitrite: NEGATIVE
Protein, ur: NEGATIVE mg/dL
Specific Gravity, Urine: 1.034 — ABNORMAL HIGH (ref 1.005–1.030)
pH: 7 (ref 5.0–8.0)

## 2021-07-27 LAB — APTT: aPTT: 28 seconds (ref 24–36)

## 2021-07-27 LAB — I-STAT BETA HCG BLOOD, ED (MC, WL, AP ONLY): I-stat hCG, quantitative: <5 m[IU]/mL (ref <5)

## 2021-07-27 LAB — LACTIC ACID, PLASMA
Lactic Acid, Venous: 3.6 mmol/L (ref 0.5–1.9)
Lactic Acid, Venous: 3.4 mmol/L (ref 0.5–1.9)
Lactic Acid, Venous: 1.5 mmol/L (ref 0.5–1.9)

## 2021-07-27 LAB — C-REACTIVE PROTEIN: CRP: 22.8 mg/dL — ABNORMAL HIGH (ref <1.0)

## 2021-07-27 LAB — PROCALCITONIN: Procalcitonin: 2.45 ng/mL

## 2021-07-27 LAB — SEDIMENTATION RATE: Sed Rate: 45 mm/hr — ABNORMAL HIGH (ref 0–22)

## 2021-07-27 LAB — RESP PANEL BY RT-PCR (FLU A&B, COVID) ARPGX2
Influenza A by PCR: NEGATIVE
Influenza B by PCR: NEGATIVE
SARS Coronavirus 2 by RT PCR: NEGATIVE

## 2021-07-27 LAB — PROTIME-INR
INR: 1.1 (ref 0.8–1.2)
Prothrombin Time: 14.4 seconds (ref 11.4–15.2)

## 2021-07-27 LAB — RETICULOCYTES
Immature Retic Fract: 17.6 % — ABNORMAL HIGH (ref 2.3–15.9)
RBC.: 4.37 MIL/uL (ref 3.87–5.11)
Retic Count, Absolute: 62.9 10*3/uL (ref 19.0–186.0)
Retic Ct Pct: 1.4 % (ref 0.4–3.1)

## 2021-07-27 LAB — CK: Total CK: 64 U/L (ref 38–234)

## 2021-07-27 LAB — LACTATE DEHYDROGENASE: LDH: 198 U/L — ABNORMAL HIGH (ref 98–192)

## 2021-07-27 MED ORDER — SODIUM CHLORIDE 0.9 % IV SOLN: 2.0000 g | Freq: Three times a day (TID) | INTRAVENOUS | Status: DC

## 2021-07-27 MED ORDER — CYCLOBENZAPRINE HCL 10 MG PO TABS
10.0000 mg | ORAL_TABLET | Freq: Every day | ORAL | Status: DC
  Administered 2021-07-27 – 2021-07-30 (×4): 10 mg via ORAL
  Filled 2021-07-27 (×4): qty 1

## 2021-07-27 MED ORDER — IOHEXOL 350 MG/ML SOLN
100.0000 mL | Freq: Once | INTRAVENOUS | Status: AC | PRN
  Administered 2021-07-27: 100 mL via INTRAVENOUS

## 2021-07-27 MED ORDER — HYDROMORPHONE HCL 1 MG/ML IJ SOLN
1.0000 mg | Freq: Once | INTRAMUSCULAR | Status: AC
  Administered 2021-07-27: 1 mg via INTRAVENOUS
  Filled 2021-07-27: qty 1

## 2021-07-27 MED ORDER — ALBUTEROL SULFATE HFA 108 (90 BASE) MCG/ACT IN AERS: 2.0000 | INHALATION_SPRAY | Freq: Four times a day (QID) | RESPIRATORY_TRACT | Status: DC | PRN

## 2021-07-27 MED ORDER — LACTATED RINGERS IV SOLN: INTRAVENOUS | Status: AC

## 2021-07-27 MED ORDER — SODIUM CHLORIDE 0.9 % IV SOLN
2.0000 g | Freq: Once | INTRAVENOUS | Status: AC
  Administered 2021-07-27: 2 g via INTRAVENOUS
  Filled 2021-07-27: qty 12.5

## 2021-07-27 MED ORDER — LIDOCAINE 5 % EX PTCH
1.0000 | MEDICATED_PATCH | CUTANEOUS | Status: DC
Start: 2021-07-27 — End: 2021-07-31
  Administered 2021-07-27 – 2021-07-31 (×3): 1 via TRANSDERMAL
  Filled 2021-07-27 (×4): qty 1

## 2021-07-27 MED ORDER — VANCOMYCIN HCL IN DEXTROSE 1-5 GM/200ML-% IV SOLN: 1000.0000 mg | Freq: Two times a day (BID) | INTRAVENOUS | Status: DC

## 2021-07-27 MED ORDER — METRONIDAZOLE 500 MG/100ML IV SOLN
500.0000 mg | Freq: Once | INTRAVENOUS | Status: AC
  Administered 2021-07-27: 500 mg via INTRAVENOUS
  Filled 2021-07-27: qty 100

## 2021-07-27 MED ORDER — ACETAMINOPHEN 325 MG PO TABS: 650.0000 mg | ORAL_TABLET | Freq: Four times a day (QID) | ORAL | Status: DC | PRN

## 2021-07-27 MED ORDER — LORAZEPAM 1 MG PO TABS
1.0000 mg | ORAL_TABLET | Freq: Once | ORAL | Status: AC
  Administered 2021-07-27: 1 mg via ORAL
  Filled 2021-07-27: qty 1

## 2021-07-27 MED ORDER — LACTATED RINGERS IV BOLUS (SEPSIS)
1000.0000 mL | Freq: Once | INTRAVENOUS | Status: AC
Start: 2021-07-27 — End: 2021-07-27
  Administered 2021-07-27: 1000 mL via INTRAVENOUS

## 2021-07-27 MED ORDER — ALBUTEROL SULFATE (2.5 MG/3ML) 0.083% IN NEBU: 2.5000 mg | INHALATION_SOLUTION | RESPIRATORY_TRACT | Status: DC | PRN

## 2021-07-27 MED ORDER — ATORVASTATIN CALCIUM 10 MG PO TABS
10.0000 mg | ORAL_TABLET | Freq: Every day | ORAL | Status: DC
  Administered 2021-07-27 – 2021-07-31 (×5): 10 mg via ORAL
  Filled 2021-07-27 (×5): qty 1

## 2021-07-27 MED ORDER — VANCOMYCIN HCL 1500 MG/300ML IV SOLN
1500.0000 mg | Freq: Once | INTRAVENOUS | Status: AC
  Administered 2021-07-27: 1500 mg via INTRAVENOUS
  Filled 2021-07-27: qty 300

## 2021-07-27 MED ORDER — GABAPENTIN 300 MG PO CAPS
300.0000 mg | ORAL_CAPSULE | Freq: Three times a day (TID) | ORAL | Status: DC
  Administered 2021-07-27 – 2021-07-31 (×13): 300 mg via ORAL
  Filled 2021-07-27 (×13): qty 1

## 2021-07-27 MED ORDER — GADOBUTROL 1 MMOL/ML IV SOLN
7.0000 mL | Freq: Once | INTRAVENOUS | Status: AC | PRN
  Administered 2021-07-27: 7 mL via INTRAVENOUS

## 2021-07-27 MED ORDER — ACETAMINOPHEN 325 MG PO TABS
650.0000 mg | ORAL_TABLET | Freq: Four times a day (QID) | ORAL | Status: DC | PRN
Start: 2021-07-27 — End: 2021-07-27

## 2021-07-27 MED ORDER — DICLOFENAC SODIUM 1 % EX GEL
2.0000 g | Freq: Two times a day (BID) | CUTANEOUS | Status: DC | PRN
Start: 2021-07-27 — End: 2021-07-31

## 2021-07-27 MED ORDER — IBUPROFEN 400 MG PO TABS
400.0000 mg | ORAL_TABLET | Freq: Once | ORAL | Status: AC
  Administered 2021-07-27: 400 mg via ORAL
  Filled 2021-07-27: qty 1

## 2021-07-27 MED ORDER — ACETAMINOPHEN 650 MG RE SUPP
650.0000 mg | Freq: Four times a day (QID) | RECTAL | Status: DC | PRN
Start: 2021-07-27 — End: 2021-07-31

## 2021-07-27 MED ORDER — VANCOMYCIN HCL IN DEXTROSE 1-5 GM/200ML-% IV SOLN: 1000.0000 mg | Freq: Once | INTRAVENOUS | Status: DC

## 2021-07-27 MED ORDER — IBUPROFEN 400 MG PO TABS
400.0000 mg | ORAL_TABLET | Freq: Four times a day (QID) | ORAL | Status: DC | PRN
  Administered 2021-07-27 – 2021-07-30 (×7): 400 mg via ORAL
  Filled 2021-07-27 (×7): qty 1

## 2021-07-27 MED ORDER — ACETAMINOPHEN 650 MG RE SUPP: 650.0000 mg | Freq: Four times a day (QID) | RECTAL | Status: DC | PRN

## 2021-07-27 MED ORDER — FOLIC ACID 1 MG PO TABS
1.0000 mg | ORAL_TABLET | Freq: Every day | ORAL | Status: DC
  Administered 2021-07-27 – 2021-07-31 (×5): 1 mg via ORAL
  Filled 2021-07-27 (×5): qty 1

## 2021-07-27 MED ORDER — LACTATED RINGERS IV BOLUS
1000.0000 mL | Freq: Once | INTRAVENOUS | Status: AC
  Administered 2021-07-27: 1000 mL via INTRAVENOUS

## 2021-07-27 MED ORDER — HYDROMORPHONE HCL 1 MG/ML IJ SOLN
0.5000 mg | INTRAMUSCULAR | Status: DC | PRN
  Administered 2021-07-27 – 2021-07-28 (×6): 0.5 mg via INTRAVENOUS
  Filled 2021-07-27 (×6): qty 0.5

## 2021-07-27 MED ORDER — ACETAMINOPHEN 325 MG PO TABS
650.0000 mg | ORAL_TABLET | Freq: Once | ORAL | Status: AC
  Administered 2021-07-27: 650 mg via ORAL
  Filled 2021-07-27: qty 2

## 2021-07-27 MED ORDER — HYDROMORPHONE HCL 1 MG/ML IJ SOLN
0.5000 mg | Freq: Once | INTRAMUSCULAR | Status: AC
  Administered 2021-07-27: 0.5 mg via INTRAVENOUS
  Filled 2021-07-27: qty 1

## 2021-07-27 MED ORDER — ENOXAPARIN SODIUM 40 MG/0.4ML IJ SOSY
40.0000 mg | PREFILLED_SYRINGE | INTRAMUSCULAR | Status: DC
  Administered 2021-07-27 – 2021-07-31 (×5): 40 mg via SUBCUTANEOUS
  Filled 2021-07-27 (×5): qty 0.4

## 2021-07-27 MED ORDER — ACETAMINOPHEN 500 MG PO TABS: 1000.0000 mg | ORAL_TABLET | Freq: Four times a day (QID) | ORAL | Status: DC | PRN

## 2021-07-27 MED ORDER — ONDANSETRON HCL 4 MG/2ML IJ SOLN
4.0000 mg | Freq: Once | INTRAMUSCULAR | Status: AC
  Administered 2021-07-27: 4 mg via INTRAVENOUS
  Filled 2021-07-27: qty 2

## 2021-07-27 MED ORDER — SODIUM CHLORIDE 0.9 % IV SOLN: INTRAVENOUS | Status: DC
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
## 2021-07-27 NOTE — ED Notes (Signed)
Xray and IV team at bedside

## 2021-07-27 NOTE — ED Notes (Signed)
Pt taken to CT.

## 2021-07-27 NOTE — Progress Notes (Signed)
Pharmacy Antibiotic Note  Tina Huber is a 46 y.o. female admitted on 07/27/2021 with fevers and abdominal pain, possible sepsis.  Pharmacy has been consulted for Vancomycin and Cefepime dosing.  Vancomycin 1500 mg IV given in ED at  0415  Plan: Vancomycin 1000 mg IV q12h Cefepime 2 g IV q8h  Height: 4\' 11"  (149.9 cm) Weight: 68 kg (150 lb) IBW/kg (Calculated) : 43.2  Temp (24hrs), Avg:100.8 F (38.2 C), Min:99.7 F (37.6 C), Max:101.4 F (38.6 C)  Recent Labs  Lab 07/27/21 0109 07/27/21 0206 07/27/21 0344  WBC 13.5*  --   --   CREATININE 0.86  --   --   LATICACIDVEN  --  3.6* 3.4*    Estimated Creatinine Clearance: 70 mL/min (by C-G formula based on SCr of 0.86 mg/dL).    Allergies  Allergen Reactions   Latex     Gloves- made hands itch and burn   Quinine     Other reaction(s): itching Other reaction(s): itching   Quinine Derivatives Itching    07/29/21 07/27/2021 6:23 AM

## 2021-07-27 NOTE — ED Notes (Signed)
Pt post void bladder scan - 580 - PA notified

## 2021-07-27 NOTE — ED Notes (Signed)
PA notified of temp - PA at bedside now

## 2021-07-27 NOTE — Sepsis Progress Note (Signed)
Elink following Code sepsis  

## 2021-07-27 NOTE — ED Notes (Signed)
Pt desatted to mid 80s - placed on 2L with improvement

## 2021-07-27 NOTE — ED Notes (Signed)
PA notified of critical lactic  

## 2021-07-27 NOTE — ED Triage Notes (Signed)
Patient arrived with EMS from home reports pain across her abdomen radiating to both legs and low back pain onset yesterday , she received Fentanyl 100 mcg IV by EMS prior to arrival . HR = 150+ at arrival .

## 2021-07-27 NOTE — Progress Notes (Signed)
  Carryover admission to the Day Admitter.  I discussed this case with the EDP, Loleta Dicker, PA.  Per these discussions:   This is a 46 year old female with untreated HIV (most recent CD4 count was 780 in March 2023), chronic low back pain, who is being admitted for severe sepsis of unclear source after presenting with abdominal pain, with preliminary evaluation notable for objective fever, with temperature 101.4, sinus tachycardia, leukocytosis, and elevated lactate of 3.6, subsequently decreasing to 3.4 following interval administration of 2 L LR bolus.  Source of potential underlying infection clear at this time, including CT abdomen/pelvis, which reportedly showed a distended bladder, but otherwise no evidence of acute intra-abdominal process.  Postvoid residual scan 600 cc, and a Foley catheter was placed in the ED this evening.  Blood cultures x2 collected and broad-spectrum IV antibiotics were initiated in the form of IV vancomycin, cefepime, IV Flagyl.  No evidence of hypotension, the patient has persistent tachycardia, reported to be sinus in nature.   In setting of report of chronic low back pain, patient denies any significant exacerbation thereof.  However, in the absence of alternate underlying infectious source, EDP is also ordered MRI of the lumbar spine, with result currently pending.  I have placed an order for inpatient admission to PCU.   I have placed some additional preliminary admit orders via the adult multi-morbid admission order set. I have continued existing continuous LR, and ordered repeat lactate to be checked at 8 AM.  I have continued previously initiated IV vancomycin and cefepime.     Newton Pigg, DO Hospitalist

## 2021-07-27 NOTE — ED Notes (Signed)
PA at bedside.

## 2021-07-27 NOTE — ED Notes (Signed)
Foley attempted x2 unsuccessful - MD notified - Per MD  Howerter send pt to MRI first and then can reattempt foley at a later time - Pt taken to MRI at this time

## 2021-07-27 NOTE — ED Provider Notes (Signed)
[facility] EMERGENCY DEPARTMENT Provider Note   CSN: [id number] Arrival date & time: 07/27/21  0021     History  Chief Complaint  Patient presents with   Abdominal Pain   Tina Huber is a 46 y.o. female With sickle cell trait and history of HIV is a chronic nonprogressive air not treated with ART who presents with concern for 3 days of progressively worsening abdominal pain now with fevers, chest pain, shortness of breath, and severe back pain.  Patient has history of chronic back pain.  Patient tearful, rolling around in the bed, complaining of shortness of breath at time of my initial evaluation.  Level 5 caveat due to acuity of patient's presentation upon arrival.  I personally reviewed this patient's chart.  In addition to the above listed she has history of chronic pain syndrome with back pain, obesity, PTSD, and rheumatoid arthritis. Last CD4 count 773 on 04/14/21.  HPI     Home Medications Prior to Admission medications   Medication Sig Start Date End Date Taking? Authorizing Provider  albuterol (PROVENTIL) (2.5 MG/3ML) 0.083% nebulizer solution Take 3 mLs (2.5 mg total) by nebulization every 4 (four) hours as needed for wheezing or shortness of breath. 07/20/20 07/20/21  Barbette Merino, NP  albuterol (VENTOLIN HFA) 108 (90 Base) MCG/ACT inhaler Inhale 2 puffs into the lungs every 6 (six) hours as needed for wheezing or shortness of breath. 12/09/20   Barbette Merino, NP  atorvastatin (LIPITOR) 10 MG tablet Take 1 tablet (10 mg total) by mouth daily. 06/30/21 06/30/22  Ivonne Andrew, NP  celecoxib (CELEBREX) 200 MG capsule Take by mouth 2 (two) times daily. 06/10/20   [provider]  cyclobenzaprine (FLEXERIL) 10 MG tablet Take 10 mg by mouth at bedtime. 04/12/21   [provider]  folic acid (FOLVITE) 1 MG tablet Take 1 mg by mouth daily.    [provider]  gabapentin (NEURONTIN) 300 MG capsule Take 1 capsule (300 mg total) by mouth  3 (three) times daily. 12/09/20 12/09/21  Barbette Merino, NP  Lidocaine 4 % PTCH Apply 1 patch topically daily. 02/17/21   Orion Crook I, NP  Multiple Vitamins-Minerals (MULTIVITAMIN WITH MINERALS) tablet Take 1 tablet by mouth daily.    [provider]  oxyCODONE-acetaminophen (PERCOCET/ROXICET) 5-325 MG tablet Take 1 tablet by mouth every 6 (six) hours as needed for severe pain. 07/13/21   Quentin Angst, MD  Vitamin D, Ergocalciferol, (DRISDOL) 50000 units CAPS capsule Take 1 capsule (50,000 Units total) by mouth every 7 (seven) days. 01/09/17   Bing Neighbors, FNP      Allergies    Latex, Quinine, and Quinine derivatives    Review of Systems   Review of Systems  Constitutional:  Positive for fatigue and fever.  Respiratory:  Positive for shortness of breath. Negative for apnea, choking and chest tightness.   Cardiovascular:  Positive for chest pain. Negative for palpitations and leg swelling.  Gastrointestinal:  Positive for abdominal pain, constipation and nausea. Negative for diarrhea and vomiting.  Genitourinary:  Positive for dysuria, frequency and urgency. Negative for decreased urine volume, vaginal bleeding, vaginal discharge and vaginal pain.  Musculoskeletal: Negative.   Skin: Negative.   Neurological: Negative.   Hematological: Negative.     Physical Exam Updated Vital Signs BP 112/72   Pulse (!) 131   Temp 99.7 F (37.6 C) (Oral)   Resp (!) 23   Ht 4\' 11"  (1.499 m)   Wt 68  kg   SpO2 100%   BMI 30.30 kg/m  Physical Exam Vitals and nursing note reviewed.  Constitutional:      Appearance: She is obese. She is ill-appearing. She is not toxic-appearing.  HENT:     Head: Normocephalic and atraumatic.     Nose: Nose normal.     Mouth/Throat:     Mouth: Mucous membranes are moist.     Pharynx: Oropharynx is clear. Uvula midline. No oropharyngeal exudate or posterior oropharyngeal erythema.     Tonsils: No tonsillar exudate.  Eyes:      General: Lids are normal. Vision grossly intact.        Right eye: No discharge.        Left eye: No discharge.     Extraocular Movements: Extraocular movements intact.     Conjunctiva/sclera: Conjunctivae normal.     Pupils: Pupils are equal, round, and reactive to light.  Neck:     Trachea: Trachea and phonation normal.  Cardiovascular:     Rate and Rhythm: Regular rhythm. Tachycardia present.     Pulses: Normal pulses.     Heart sounds: Normal heart sounds.  Pulmonary:     Effort: Pulmonary effort is normal. Tachypnea present. No bradypnea, accessory muscle usage, prolonged expiration or respiratory distress.     Breath sounds: Normal breath sounds. No wheezing or rales.  Chest:     Chest wall: No mass, lacerations, deformity, swelling, tenderness, crepitus or edema.  Abdominal:     General: Bowel sounds are normal. There is no distension.     Palpations: Abdomen is soft.     Tenderness: There is generalized abdominal tenderness and tenderness in the right lower quadrant, suprapubic area and left lower quadrant. There is guarding. There is no right CVA tenderness or left CVA tenderness.     Comments: Pain out of proportion to exam.   Musculoskeletal:        General: No deformity.     Cervical back: Normal range of motion and neck supple.     Right lower leg: No edema.     Left lower leg: No edema.  Lymphadenopathy:     Cervical: No cervical adenopathy.  Skin:    General: Skin is warm and dry.     Capillary Refill: Capillary refill takes less than 2 seconds.  Neurological:     General: No focal deficit present.     Mental Status: She is alert and oriented to person, place, and time. Mental status is at baseline.  Psychiatric:        Mood and Affect: Mood normal. Affect is tearful.    ED Results / Procedures / Treatments   Labs (all labs ordered are listed, but only abnormal results are displayed) Labs Reviewed  COMPREHENSIVE METABOLIC PANEL - Abnormal; Notable for the  following components:      Result Value   Chloride 96 (*)    Glucose, Bld 186 (*)    AST 64 (*)    ALT 62 (*)    All other components within normal limits  CBC WITH DIFFERENTIAL/PLATELET - Abnormal; Notable for the following components:   WBC 13.5 (*)    Neutro Abs 11.7 (*)    All other components within normal limits  RETICULOCYTES - Abnormal; Notable for the following components:   Immature Retic Fract 17.6 (*)    All other components within normal limits  URINALYSIS, ROUTINE W REFLEX MICROSCOPIC - Abnormal; Notable for the following components:   Color, Urine STRAW (*)  Specific Gravity, Urine 1.034 (*)    All other components within normal limits  LACTIC ACID, PLASMA - Abnormal; Notable for the following components:   Lactic Acid, Venous 3.6 (*)    All other components within normal limits  LACTIC ACID, PLASMA - Abnormal; Notable for the following components:   Lactic Acid, Venous 3.4 (*)    All other components within normal limits  RESP PANEL BY RT-PCR (FLU A&B, COVID) ARPGX2  CULTURE, BLOOD (ROUTINE X 2)  CULTURE, BLOOD (ROUTINE X 2)  URINE CULTURE  PROTIME-INR  APTT  LACTIC ACID, PLASMA  I-STAT BETA HCG BLOOD, ED ([facility], [facility], [facility] ONLY)    EKG None  Radiology MR Lumbar Spine W Wo Contrast  Result Date: 07/27/2021 CLINICAL DATA:  46 year old female with HIV.  Sepsis, low back pain. EXAM: MRI LUMBAR SPINE WITHOUT AND WITH CONTRAST TECHNIQUE: Multiplanar and multiecho pulse sequences of the lumbar spine were obtained without and with intravenous contrast. CONTRAST:  7mL GADAVIST GADOBUTROL 1 MMOL/ML IV SOLN COMPARISON:  CTA chest abdomen and pelvis 0252 hours today. FINDINGS: Segmentation:  Normal on the comparison today. Alignment: Relatively normal lumbar lordosis. No spondylolisthesis. Vertebrae: Visualized bone marrow signal is within normal limits. No marrow edema or evidence of acute osseous abnormality. Intact visible sacrum and SI joints. Conus medullaris and cauda  equina: Conus extends to the L1-L2 level. No lower spinal cord or conus signal abnormality. No abnormal intradural enhancement or dural thickening. Normal cauda equina nerve roots. Paraspinal and other soft tissues: Negative aside from distended urinary bladder (series 6, image 9). Disc levels: Normal for age intervertebral disc signal and morphology from T11-T12 through L5-S1. Intermittent lumbar facet hypertrophy, mild to moderate at L1-L2, L2-L3, L5-S1. No spinal or lateral recess stenosis. Mild bilateral L5 neural foraminal stenosis. IMPRESSION: 1. No acute or inflammatory process in the Lumbar spine. Mild for age spinal degeneration. No spinal stenosis. 2. Distended urinary bladder similar to the CTA earlier today. Query urinary retention. Electronically Signed   By: Odessa Fleming M.D.   On: 07/27/2021 07:12   CT Angio Chest/Abd/Pel for Dissection W and/or Wo Contrast  Result Date: 07/27/2021 CLINICAL DATA:  Fever, chest pain, abdominal pain out of proportion to examination. Sickle cell disease, un treated HIV infection EXAM: CT ANGIOGRAPHY CHEST, ABDOMEN AND PELVIS TECHNIQUE: Non-contrast CT of the chest was initially obtained. Multidetector CT imaging through the chest, abdomen and pelvis was performed using the standard protocol during bolus administration of intravenous contrast. Multiplanar reconstructed images and MIPs were obtained and reviewed to evaluate the vascular anatomy. RADIATION DOSE REDUCTION: This exam was performed according to the departmental dose-optimization program which includes automated exposure control, adjustment of the mA and/or kV according to patient size and/or use of iterative reconstruction technique. CONTRAST:  OMNIPAQUE IOHEXOL 350 MG/ML SOLN COMPARISON:  CT abdomen pelvis 02/20/2015 FINDINGS: CTA CHEST FINDINGS Cardiovascular: Preferential opacification of the thoracic aorta. No evidence of thoracic aortic aneurysm or dissection. Normal heart size. No pericardial  effusion. Mediastinum/Nodes: No enlarged mediastinal, hilar, or axillary lymph nodes. Thyroid gland, trachea, and esophagus demonstrate no significant findings. Lungs/Pleura: Lungs are clear. No pleural effusion or pneumothorax. Musculoskeletal: No chest wall abnormality. No acute or significant osseous findings. Review of the MIP images confirms the above findings. CTA ABDOMEN AND PELVIS FINDINGS VASCULAR Aorta: Normal caliber aorta without aneurysm, dissection, vasculitis or significant stenosis. Celiac: Patent without evidence of aneurysm, dissection, vasculitis or significant stenosis. SMA: Patent without evidence of aneurysm, dissection, vasculitis or significant stenosis. Renals: Both  renal arteries are patent without evidence of aneurysm, dissection, vasculitis, fibromuscular dysplasia or significant stenosis. IMA: Patent without evidence of aneurysm, dissection, vasculitis or significant stenosis. Inflow: Patent without evidence of aneurysm, dissection, vasculitis or significant stenosis. Veins: No obvious venous abnormality within the limitations of this arterial phase study. Review of the MIP images confirms the above findings. NON-VASCULAR Hepatobiliary: At least mild hepatic steatosis. No enhancing intrahepatic mass. No intra or extrahepatic biliary ductal dilation. Gallbladder unremarkable. Pancreas: Unremarkable Spleen: Unremarkable Adrenals/Urinary Tract: The adrenal glands are unremarkable. The kidneys are normal. The bladder is mildly distended, but is otherwise unremarkable. Stomach/Bowel: Stomach is within normal limits. Appendix appears normal. No evidence of bowel wall thickening, distention, or inflammatory changes. Lymphatic: No pathologic adenopathy within the abdomen and pelvis Reproductive: Uterus and bilateral adnexa are unremarkable. Other: Tiny fat containing umbilical hernia Musculoskeletal: No acute bone abnormality. No lytic or blastic bone lesion. Review of the MIP images confirms  the above findings. IMPRESSION: 1. No evidence of thoracoabdominal aortic aneurysm or dissection. 2. No acute intrathoracic or intra-abdominal pathology identified. 3. Mild hepatic steatosis. 4. Mild bladder distension, possibly related to voluntary retention or bladder outlet obstruction. Electronically Signed   By: Helyn Numbers M.D.   On: 07/27/2021 03:13   DG Chest Port 1 View  Result Date: 07/27/2021 CLINICAL DATA:  Questionable sepsis. EXAM: PORTABLE CHEST 1 VIEW COMPARISON:  PA Lat 02/24/2021. FINDINGS: The lungs are expiratory. The heart size is normal for expiration and no vascular congestion is seen. There is streaky infrahilar opacity in the left lower lobe which could be due to low lung volumes, pneumonia or aspiration. The remaining hypoinflated lungs appear generally clear. No pleural effusion is seen. There is a stable mediastinal configuration. No acute osseous abnormality is seen. Slight thoracic dextroscoliosis. IMPRESSION: Expiratory exam. Asymmetric increased left infrahilar opacity could be due to atelectasis, pneumonia or aspiration. Follow-up study recommended in full inspiration. Electronically Signed   By: Almira Bar M.D.   On: 07/27/2021 02:24    Procedures Procedures    Medications Ordered in ED Medications  lactated ringers infusion (has no administration in time range)  acetaminophen (TYLENOL) tablet 650 mg (has no administration in time range)    Or  acetaminophen (TYLENOL) suppository 650 mg (has no administration in time range)  vancomycin (VANCOCIN) IVPB 1000 mg/200 mL premix (has no administration in time range)  ceFEPIme (MAXIPIME) 2 g in sodium chloride 0.9 % 100 mL IVPB (has no administration in time range)  lactated ringers bolus 1,000 mL (0 mLs Intravenous Stopped 07/27/21 0400)  ceFEPIme (MAXIPIME) 2 g in sodium chloride 0.9 % 100 mL IVPB (0 g Intravenous Stopped 07/27/21 0400)  metroNIDAZOLE (FLAGYL) IVPB 500 mg (0 mg Intravenous Stopped 07/27/21 0406)   HYDROmorphone (DILAUDID) injection 1 mg (1 mg Intravenous Given 07/27/21 0204)  ondansetron (ZOFRAN) injection 4 mg (4 mg Intravenous Given 07/27/21 0204)  vancomycin (VANCOREADY) IVPB 1500 mg/300 mL (0 mg Intravenous Stopped 07/27/21 0627)  acetaminophen (TYLENOL) tablet 650 mg (650 mg Oral Given 07/27/21 0217)  iohexol (OMNIPAQUE) 350 MG/ML injection 100 mL (100 mLs Intravenous Contrast Given 07/27/21 0300)  ibuprofen (ADVIL) tablet 400 mg (400 mg Oral Given 07/27/21 0414)  lactated ringers bolus 1,000 mL (0 mLs Intravenous Stopped 07/27/21 0557)  HYDROmorphone (DILAUDID) injection 0.5 mg (0.5 mg Intravenous Given 07/27/21 0412)  LORazepam (ATIVAN) tablet 1 mg (1 mg Oral Given 07/27/21 0631)  gadobutrol (GADAVIST) 1 MMOL/ML injection 7 mL (7 mLs Intravenous Contrast Given 07/27/21 0658)  ED Course/ Medical Decision Making/ A&P Clinical Course as of 07/27/21 0718  Thu Jul 27, 2021  0406 Patient urinated in the ED without difficulty, urine clear and pale yellow at the bedside, RN to document post-void bladder residual to evaluate for retention. [RS]  0408 Reevaluated with improvement but persistence in her pain. Will redose and await result of post-void residual. Ibuprofen ordered for persistent fever to 101.2 F despite tylenol 2 hours ago.  [RS]  0502 Per RN postvoid residual was 580 cc.  Unfortunately presentation is concerning for likely bladder outlet obstruction.  Will place Foley catheter at this time. [RS]  0524 Temp improved to 98 F orally, as collected by this provider.  [RS]  [phone] Consult to Dr. Arlean Hopping, hospitalist, who is agreeable to admitting this patient to his service. I appreciate his collaboration in the care of this complex patient.  [RS]    Clinical Course User Index [RS] Paris Lore, PA-C                           Medical Decision Making 46 year old female with HIV, RA, and sickle cell trait  who presents with severe abdominal pain.   Tachycardic and febrile on  intake, cardiac exam is reassuring - tachycardia with regular rhythm, abdominal exam with exquisite generalized TTP, out of proportion to exam. Neurologically intact.   Patient does meet sepsis criteria therefore we will proceed with broad-spectrum antibiotics and fluids at this time.  We will hold off on 30 cc/kg bolus given patient's Elevated blood pressure and pending lactic acid.   Amount and/or Complexity of Data Reviewed Labs: ordered.    Details: CBC with leukocytosis of 13,000 without anemia, CMP with AST/ALT 64/62.  Pregnancy test is negative.  LActic 3.6, patient recieving LR boluses Radiology: ordered.    Details: Chest x-ray with possible left infrahilar opacity.  Given degree of patient's discomfort in context of untreated HIV will proceed with CT imaging of the chest and abdomen for further characterization of her presentation. CTA CAP negative for dissection or acute intrathoracic etiology for patient's pain; bladder distention, ? outlet obstruction. ECG/medicine tests: ordered.  Risk OTC drugs. Prescription drug management. Decision regarding hospitalization.   Patient will require admission to the hospital for management of sepsis of unknown etiology in context of untreated HIV, with acute urinary retention with post residual of 600.  Challenge placing Foley catheter per RN, inpatient service to consult urology.  Patient was admitted to the hospital medicine service as above.  MRI lumbar spine Without acute inflammatory process in the lumbar spine or spinal stenosis.  Marayah voiced understanding of her medical evaluation and treatment plan. Each of their questions answered to their expressed satisfaction.  This chart was dictated using voice recognition software, Dragon. Despite the best efforts of this provider to proofread and correct errors, errors may still occur which can change documentation meaning.    Final Clinical Impression(s) / ED Diagnoses Final diagnoses:   Sepsis, due to unspecified organism, unspecified whether acute organ dysfunction present [facility])  Urinary retention    Rx / DC Orders ED Discharge Orders     None         Sherrilee Gilles 07/27/21 0718    Mesner, Barbara Cower, MD 07/27/21 [phone]

## 2021-07-27 NOTE — ED Notes (Signed)
PA notified that pt is hard stick - phlebotomy asked to obtain labs and IV team consulted

## 2021-07-28 ENCOUNTER — Inpatient Hospital Stay (HOSPITAL_COMMUNITY): Payer: Managed Care, Other (non HMO)

## 2021-07-28 DIAGNOSIS — R109 Unspecified abdominal pain: Secondary | ICD-10-CM | POA: Diagnosis not present

## 2021-07-28 DIAGNOSIS — B20 Human immunodeficiency virus [HIV] disease: Secondary | ICD-10-CM

## 2021-07-28 DIAGNOSIS — R6520 Severe sepsis without septic shock: Secondary | ICD-10-CM | POA: Diagnosis not present

## 2021-07-28 DIAGNOSIS — A419 Sepsis, unspecified organism: Secondary | ICD-10-CM | POA: Diagnosis not present

## 2021-07-28 LAB — PROCALCITONIN: Procalcitonin: 2.58 ng/mL

## 2021-07-28 LAB — CBC
HCT: 31.3 % — ABNORMAL LOW (ref 36.0–46.0)
Hemoglobin: 10.8 g/dL — ABNORMAL LOW (ref 12.0–15.0)
MCH: 29.6 pg (ref 26.0–34.0)
MCHC: 34.5 g/dL (ref 30.0–36.0)
MCV: 85.8 fL (ref 80.0–100.0)
Platelets: 211 10*3/uL (ref 150–400)
RBC: 3.65 MIL/uL — ABNORMAL LOW (ref 3.87–5.11)
RDW: 13.9 % (ref 11.5–15.5)
WBC: 15.8 10*3/uL — ABNORMAL HIGH (ref 4.0–10.5)
nRBC: 0 % (ref 0.0–0.2)

## 2021-07-28 LAB — URINE CULTURE

## 2021-07-28 LAB — HEPATIC FUNCTION PANEL
ALT: 47 U/L — ABNORMAL HIGH (ref 0–44)
AST: 38 U/L (ref 15–41)
Albumin: 2.9 g/dL — ABNORMAL LOW (ref 3.5–5.0)
Alkaline Phosphatase: 88 U/L (ref 38–126)
Bilirubin, Direct: 0.2 mg/dL (ref 0.0–0.2)
Indirect Bilirubin: 0.7 mg/dL (ref 0.3–0.9)
Total Bilirubin: 0.9 mg/dL (ref 0.3–1.2)
Total Protein: 6.9 g/dL (ref 6.5–8.1)

## 2021-07-28 MED ORDER — BISACODYL 10 MG RE SUPP
10.0000 mg | Freq: Once | RECTAL | Status: AC
  Administered 2021-07-28: 10 mg via RECTAL
  Filled 2021-07-28: qty 1

## 2021-07-28 MED ORDER — HYDROMORPHONE HCL 1 MG/ML IJ SOLN
0.5000 mg | INTRAMUSCULAR | Status: DC | PRN
  Administered 2021-07-28 – 2021-07-31 (×14): 0.5 mg via INTRAVENOUS
  Filled 2021-07-28 (×15): qty 0.5

## 2021-07-28 MED ORDER — POLYETHYLENE GLYCOL 3350 17 G PO PACK
17.0000 g | PACK | Freq: Every day | ORAL | Status: DC
  Administered 2021-07-28 – 2021-07-29 (×2): 17 g via ORAL
  Filled 2021-07-28 (×2): qty 1

## 2021-07-28 NOTE — Progress Notes (Signed)
[facility]   Reason for visit: Follow up on abdominal pain  Interval History: still with the same abdominal pain; no fever.  WBC stable at 15.8.      Physical Exam: Constitutional:  Vitals:   07/28/21 0600 07/28/21 0700  BP: 100/72 110/70  Pulse: (!) 116 (!) 125  Resp:  18  Temp:  98.5 F (36.9 C)  SpO2: 100% 100%   patient appears in NAD Respiratory: Normal respiratory effort  Review of Systems: Constitutional: negative for fevers and chills  Lab Results  Component Value Date   WBC 15.8 (H) 07/28/2021   HGB 10.8 (L) 07/28/2021   HCT 31.3 (L) 07/28/2021   MCV 85.8 07/28/2021   PLT 211 07/28/2021    Lab Results  Component Value Date   CREATININE 0.86 07/27/2021   BUN 6 07/27/2021   NA 136 07/27/2021   K 3.9 07/27/2021   CL 96 (L) 07/27/2021   CO2 25 07/27/2021    Lab Results  Component Value Date   ALT 47 (H) 07/28/2021   AST 38 07/28/2021   ALKPHOS 88 07/28/2021     Microbiology: Recent Results (from the past 240 hour(s))  Resp Panel by RT-PCR (Flu A&B, Covid) Anterior Nasal Swab     Status: None   Collection Time: 07/27/21  1:51 AM   Specimen: Anterior Nasal Swab  Result Value Ref Range Status   SARS Coronavirus 2 by RT PCR NEGATIVE NEGATIVE Final    Comment: (NOTE) SARS-CoV-2 target nucleic acids are NOT DETECTED.  The SARS-CoV-2 RNA is generally detectable in upper respiratory specimens during the acute phase of infection. The lowest concentration of SARS-CoV-2 viral copies this assay can detect is 138 copies/mL. A negative result does not preclude SARS-Cov-2 infection and should not be used as the sole basis for treatment or other patient management decisions. A negative result may occur with  improper specimen collection/handling, submission of specimen other than nasopharyngeal swab, presence of viral mutation(s) within the areas targeted by this assay, and inadequate number of viral copies(<138 copies/mL). A  negative result must be combined with clinical observations, patient history, and epidemiological information. The expected result is Negative.  Fact Sheet for Patients:  BloggerCourse.com  Fact Sheet for Healthcare Providers:  SeriousBroker.it  This test is no t yet approved or cleared by the [place] FDA and  has been authorized for detection and/or diagnosis of SARS-CoV-2 by FDA under an Emergency Use Authorization (EUA). This EUA will remain  in effect (meaning this test can be used) for the duration of the COVID-19 declaration under Section 564(b)(1) of the Act, 21 U.S.C.section 360bbb-3(b)(1), unless the authorization is terminated  or revoked sooner.       Influenza A by PCR NEGATIVE NEGATIVE Final   Influenza B by PCR NEGATIVE NEGATIVE Final    Comment: (NOTE) The Xpert Xpress SARS-CoV-2/FLU/RSV plus assay is intended as an aid in the diagnosis of influenza from Nasopharyngeal swab specimens and should not be used as a sole basis for treatment. Nasal washings and aspirates are unacceptable for Xpert Xpress SARS-CoV-2/FLU/RSV testing.  Fact Sheet for Patients: BloggerCourse.com  Fact Sheet for Healthcare Providers: SeriousBroker.it  This test is not yet approved or cleared by the [place] FDA and has been authorized for detection and/or diagnosis of SARS-CoV-2 by FDA under an Emergency Use Authorization (EUA). This EUA will remain in effect (meaning this test can be used) for the duration of the COVID-19 declaration under Section 564(b)(1) of  the Act, 21 U.S.C. section 360bbb-3(b)(1), unless the authorization is terminated or revoked.  Performed at [facility], [address], [place], [place] [address]   Urine Culture     Status: Abnormal   Collection Time: 07/27/21  3:58 AM   Specimen: In/Out Cath Urine  Result Value Ref Range Status   Specimen  Description IN/OUT CATH URINE  Final   Special Requests   Final    NONE Performed at [facility], [address], [place], [place] [address]    Culture MULTIPLE SPECIES PRESENT, SUGGEST RECOLLECTION (A)  Final   Report Status 07/28/2021 FINAL  Final    Impression/Plan:  1. Abdominal pain associated with some nasuea - no new concerns.  CT scan and work up has been unrevealing.  AST/ALT improving.  Most c/w a viral syndrome.   Plan as per Dr. Jerral Ralph with a bowel regimen and pain control.  2.  HIV - she is a long term non-progressor.  No antiretroviral treatment indicated for her.    3.  Fever - fever curve has trended down to normal.    She has follow up with me already scheduled.  Otherwise I will sign off.

## 2021-07-28 NOTE — Progress Notes (Signed)
HOSPITAL MEDICINE OVERNIGHT EVENT NOTE    Patient complaining of worsening abdominal pain according to nursing.  Patient reports pain is diffuse, 9 out of 10 in intensity and has been unrelenting despite doses of as needed Dilaudid.  Chart reviewed, patient presenting with fever.  Patient has a history of HIV not on antiretroviral therapy found to be exhibiting leukocytosis, tachycardia and elevated procalcitonin.  Patient has been seen by infectious disease who states that they feel this is viral in origin.  Patient has undergone extensive work-up thus far which has not identified a source of infection.  Due to patient's changing/worsening abdominal pain perforation is unlikely considering reassuring CT imaging from several days ago but will obtain repeat abdominal x-ray to evaluate for any evidence of free air.  Patient reports that laxatives administered earlier in the day have been unsuccessful.  Marinda Elk  MD [facility]   ADDENDUM (7/1 1am)  X-ray simply shows constipation.  Nursing giving patient warm prune juice now.   Deno Lunger [place] Goh

## 2021-07-28 NOTE — Progress Notes (Signed)
PROGRESS NOTE    Tina Huber  ZOX:[id number] DOB: 1976/01/12 DOA: 07/27/2021 PCP: Ivonne Andrew, NP    Brief Narrative:  46 year old with history of sickle cell trait, rheumatoid arthritis, chronic back pain, nonprogressive HIV presented with new onset abdominal pain and pelvic pain and fever at home.  Patient does have a history of HIV with no progression, no recent viral count under supervision by ID clinic, she recently suffered from back pain and radicular pain treated with steroids.  For last 2 weeks she has been suffering from constipation, severe ongoing abdominal pain, bilateral thigh pain. At the emergency room she was tachycardic and temperature 101.2, blood pressure stable.  Lactic acid 3.6.  WBC 13.5.  CT scan abdomen pelvis without any acute findings.  MRI of the lumbar spine without any acute findings.  Infection work-up so is started and patient admitted to the hospital given significant findings.  She had mild transaminitis but no other electrolyte abnormalities.   Assessment & Plan:   Fever of unknown origin: So far no clear source of infection.  Followed by ID.  Initially given antibiotics in the ER, however currently keeping off all the antibiotics. Blood cultures are pending negative so far. Fever curve is improving.  Afebrile overnight. Multiple investigations including MRI of the spine, CT scan abdomen pelvis without acute findings. Discussed with infectious disease, planning to keep off antibiotics and monitor.  Abdominal pain with constipation: Unknown whether radicular pain.  No evidence of peritonitis, no evidence of abdominal wall infection/swelling. Adequate pain medications.  Aggressive laxatives today to ensure a smooth bowel movements. Urinary retention was secondary to abdomen pain that has improved now.  Obesity: BMI more than 30.  Currently with acute illness.  Will benefit with weight loss and exercise.   DVT prophylaxis: enoxaparin (LOVENOX)  injection 40 mg Start: 07/27/21 1015 SCDs Start: 07/27/21 0547   Code Status: Full code Family Communication: None Disposition Plan: Status is: Inpatient Remains inpatient appropriate because: Significant abdominal pain, work-up for fever     Consultants:  Infectious disease  Procedures:  None  Antimicrobials:  None   Subjective: Patient seen and examined.  I was called to the bedside to talk to the patient as she is having diffuse abdominal pain, unable to have bowel movement for the last 7 days.  Denies any nausea but just does not feel well.  Telemetry shows sinus tachycardia.  Objective: Vitals:   07/28/21 0500 07/28/21 0600 07/28/21 0700 07/28/21 1100  BP:  100/72 110/70 111/75  Pulse:  (!) 116 (!) 125 (!) 115  Resp:   18 16  Temp:   98.5 F (36.9 C) 98.2 F (36.8 C)  TempSrc:   Oral Oral  SpO2:  100% 100% 99%  Weight: 68.1 kg     Height:        Intake/Output Summary (Last 24 hours) at 07/28/2021 1337 Last data filed at 07/28/2021 0955 Gross per 24 hour  Intake 1333.8 ml  Output 902 ml  Net 431.8 ml   Filed Weights   07/27/21 0212 07/28/21 0500  Weight: 68 kg 68.1 kg    Examination:  General exam: Appears anxious.  In mild distress. Respiratory system: Clear to auscultation. Respiratory effort normal.  No added sounds. Cardiovascular system: S1 & S2 heard, RRR.  Tachycardic. No pedal edema. Gastrointestinal system: Soft.  Diffusely tender to palpation.  No rigidity or guarding.   Central nervous system: Alert and oriented. No focal neurological deficits. Extremities: Symmetric 5 x 5 power. Skin:  No rashes, lesions or ulcers Psychiatry: Anxious.    Data Reviewed: I have personally reviewed following labs and imaging studies  CBC: Recent Labs  Lab 07/27/21 0109 07/28/21 0157  WBC 13.5* 15.8*  NEUTROABS 11.7*  --   HGB 12.5 10.8*  HCT 37.0 31.3*  MCV 86.7 85.8  PLT 243 211   Basic Metabolic Panel: Recent Labs  Lab 07/27/21 0109  NA 136   K 3.9  CL 96*  CO2 25  GLUCOSE 186*  BUN 6  CREATININE 0.86  CALCIUM 9.3   GFR: Estimated Creatinine Clearance: 70.1 mL/min (by C-G formula based on SCr of 0.86 mg/dL). Liver Function Tests: Recent Labs  Lab 07/27/21 0109 07/28/21 0157  AST 64* 38  ALT 62* 47*  ALKPHOS 89 88  BILITOT 0.5 0.9  PROT 7.8 6.9  ALBUMIN 3.8 2.9*   No results for input(s): "LIPASE", "AMYLASE" in the last 168 hours. No results for input(s): "AMMONIA" in the last 168 hours. Coagulation Profile: Recent Labs  Lab 07/27/21 0218  INR 1.1   Cardiac Enzymes: Recent Labs  Lab 07/27/21 0940  CKTOTAL 64   BNP (last 3 results) No results for input(s): "PROBNP" in the last 8760 hours. HbA1C: No results for input(s): "HGBA1C" in the last 72 hours. CBG: No results for input(s): "GLUCAP" in the last 168 hours. Lipid Profile: No results for input(s): "CHOL", "HDL", "LDLCALC", "TRIG", "CHOLHDL", "LDLDIRECT" in the last 72 hours. Thyroid Function Tests: No results for input(s): "TSH", "T4TOTAL", "FREET4", "T3FREE", "THYROIDAB" in the last 72 hours. Anemia Panel: Recent Labs    07/27/21 0109  RETICCTPCT 1.4   Sepsis Labs: Recent Labs  Lab 07/27/21 0206 07/27/21 0344 07/27/21 0759 07/27/21 0940 07/28/21 0157  PROCALCITON  --   --   --  2.45 2.58  LATICACIDVEN 3.6* 3.4* 1.5  --   --     Recent Results (from the past 240 hour(s))  Resp Panel by RT-PCR (Flu A&B, Covid) Anterior Nasal Swab     Status: None   Collection Time: 07/27/21  1:51 AM   Specimen: Anterior Nasal Swab  Result Value Ref Range Status   SARS Coronavirus 2 by RT PCR NEGATIVE NEGATIVE Final    Comment: (NOTE) SARS-CoV-2 target nucleic acids are NOT DETECTED.  The SARS-CoV-2 RNA is generally detectable in upper respiratory specimens during the acute phase of infection. The lowest concentration of SARS-CoV-2 viral copies this assay can detect is 138 copies/mL. A negative result does not preclude SARS-Cov-2 infection and  should not be used as the sole basis for treatment or other patient management decisions. A negative result may occur with  improper specimen collection/handling, submission of specimen other than nasopharyngeal swab, presence of viral mutation(s) within the areas targeted by this assay, and inadequate number of viral copies(<138 copies/mL). A negative result must be combined with clinical observations, patient history, and epidemiological information. The expected result is Negative.  Fact Sheet for Patients:  BloggerCourse.com  Fact Sheet for Healthcare Providers:  SeriousBroker.it  This test is no t yet approved or cleared by the [place] FDA and  has been authorized for detection and/or diagnosis of SARS-CoV-2 by FDA under an Emergency Use Authorization (EUA). This EUA will remain  in effect (meaning this test can be used) for the duration of the COVID-19 declaration under Section 564(b)(1) of the Act, 21 U.S.C.section 360bbb-3(b)(1), unless the authorization is terminated  or revoked sooner.       Influenza A by PCR NEGATIVE NEGATIVE Final  Influenza B by PCR NEGATIVE NEGATIVE Final    Comment: (NOTE) The Xpert Xpress SARS-CoV-2/FLU/RSV plus assay is intended as an aid in the diagnosis of influenza from Nasopharyngeal swab specimens and should not be used as a sole basis for treatment. Nasal washings and aspirates are unacceptable for Xpert Xpress SARS-CoV-2/FLU/RSV testing.  Fact Sheet for Patients: BloggerCourse.com  Fact Sheet for Healthcare Providers: SeriousBroker.it  This test is not yet approved or cleared by the [place] FDA and has been authorized for detection and/or diagnosis of SARS-CoV-2 by FDA under an Emergency Use Authorization (EUA). This EUA will remain in effect (meaning this test can be used) for the duration of the COVID-19 declaration  under Section 564(b)(1) of the Act, 21 U.S.C. section 360bbb-3(b)(1), unless the authorization is terminated or revoked.  Performed at [facility], [address], [place], [place] [address]   Urine Culture     Status: Abnormal   Collection Time: 07/27/21  3:58 AM   Specimen: In/Out Cath Urine  Result Value Ref Range Status   Specimen Description IN/OUT CATH URINE  Final   Special Requests   Final    NONE Performed at [facility], [address] [place], [place] [address]    Culture MULTIPLE SPECIES PRESENT, SUGGEST RECOLLECTION (A)  Final   Report Status 07/28/2021 FINAL  Final         Radiology Studies: MR Lumbar Spine W Wo Contrast  Result Date: 07/27/2021 CLINICAL DATA:  46 year old female with HIV.  Sepsis, low back pain. EXAM: MRI LUMBAR SPINE WITHOUT AND WITH CONTRAST TECHNIQUE: Multiplanar and multiecho pulse sequences of the lumbar spine were obtained without and with intravenous contrast. CONTRAST:  7mL GADAVIST GADOBUTROL 1 MMOL/ML IV SOLN COMPARISON:  CTA chest abdomen and pelvis 0252 hours today. FINDINGS: Segmentation:  Normal on the comparison today. Alignment: Relatively normal lumbar lordosis. No spondylolisthesis. Vertebrae: Visualized bone marrow signal is within normal limits. No marrow edema or evidence of acute osseous abnormality. Intact visible sacrum and SI joints. Conus medullaris and cauda equina: Conus extends to the L1-L2 level. No lower spinal cord or conus signal abnormality. No abnormal intradural enhancement or dural thickening. Normal cauda equina nerve roots. Paraspinal and other soft tissues: Negative aside from distended urinary bladder (series 6, image 9). Disc levels: Normal for age intervertebral disc signal and morphology from T11-T12 through L5-S1. Intermittent lumbar facet hypertrophy, mild to moderate at L1-L2, L2-L3, L5-S1. No spinal or lateral recess stenosis. Mild bilateral L5 neural foraminal stenosis. IMPRESSION: 1. No acute  or inflammatory process in the Lumbar spine. Mild for age spinal degeneration. No spinal stenosis. 2. Distended urinary bladder similar to the CTA earlier today. Query urinary retention. Electronically Signed   By: Odessa Fleming M.D.   On: 07/27/2021 07:12   CT Angio Chest/Abd/Pel for Dissection W and/or Wo Contrast  Result Date: 07/27/2021 CLINICAL DATA:  Fever, chest pain, abdominal pain out of proportion to examination. Sickle cell disease, un treated HIV infection EXAM: CT ANGIOGRAPHY CHEST, ABDOMEN AND PELVIS TECHNIQUE: Non-contrast CT of the chest was initially obtained. Multidetector CT imaging through the chest, abdomen and pelvis was performed using the standard protocol during bolus administration of intravenous contrast. Multiplanar reconstructed images and MIPs were obtained and reviewed to evaluate the vascular anatomy. RADIATION DOSE REDUCTION: This exam was performed according to the departmental dose-optimization program which includes automated exposure control, adjustment of the mA and/or kV according to patient size and/or use of iterative reconstruction technique. CONTRAST:  OMNIPAQUE IOHEXOL 350 MG/ML SOLN COMPARISON:  CT abdomen pelvis 02/20/2015 FINDINGS: CTA CHEST FINDINGS Cardiovascular: Preferential opacification of the thoracic aorta. No evidence of thoracic aortic aneurysm or dissection. Normal heart size. No pericardial effusion. Mediastinum/Nodes: No enlarged mediastinal, hilar, or axillary lymph nodes. Thyroid gland, trachea, and esophagus demonstrate no significant findings. Lungs/Pleura: Lungs are clear. No pleural effusion or pneumothorax. Musculoskeletal: No chest wall abnormality. No acute or significant osseous findings. Review of the MIP images confirms the above findings. CTA ABDOMEN AND PELVIS FINDINGS VASCULAR Aorta: Normal caliber aorta without aneurysm, dissection, vasculitis or significant stenosis. Celiac: Patent without evidence of aneurysm, dissection, vasculitis or  significant stenosis. SMA: Patent without evidence of aneurysm, dissection, vasculitis or significant stenosis. Renals: Both renal arteries are patent without evidence of aneurysm, dissection, vasculitis, fibromuscular dysplasia or significant stenosis. IMA: Patent without evidence of aneurysm, dissection, vasculitis or significant stenosis. Inflow: Patent without evidence of aneurysm, dissection, vasculitis or significant stenosis. Veins: No obvious venous abnormality within the limitations of this arterial phase study. Review of the MIP images confirms the above findings. NON-VASCULAR Hepatobiliary: At least mild hepatic steatosis. No enhancing intrahepatic mass. No intra or extrahepatic biliary ductal dilation. Gallbladder unremarkable. Pancreas: Unremarkable Spleen: Unremarkable Adrenals/Urinary Tract: The adrenal glands are unremarkable. The kidneys are normal. The bladder is mildly distended, but is otherwise unremarkable. Stomach/Bowel: Stomach is within normal limits. Appendix appears normal. No evidence of bowel wall thickening, distention, or inflammatory changes. Lymphatic: No pathologic adenopathy within the abdomen and pelvis Reproductive: Uterus and bilateral adnexa are unremarkable. Other: Tiny fat containing umbilical hernia Musculoskeletal: No acute bone abnormality. No lytic or blastic bone lesion. Review of the MIP images confirms the above findings. IMPRESSION: 1. No evidence of thoracoabdominal aortic aneurysm or dissection. 2. No acute intrathoracic or intra-abdominal pathology identified. 3. Mild hepatic steatosis. 4. Mild bladder distension, possibly related to voluntary retention or bladder outlet obstruction. Electronically Signed   By: Helyn Numbers M.D.   On: 07/27/2021 03:13   DG Chest Port 1 View  Result Date: 07/27/2021 CLINICAL DATA:  Questionable sepsis. EXAM: PORTABLE CHEST 1 VIEW COMPARISON:  PA Lat 02/24/2021. FINDINGS: The lungs are expiratory. The heart size is normal for  expiration and no vascular congestion is seen. There is streaky infrahilar opacity in the left lower lobe which could be due to low lung volumes, pneumonia or aspiration. The remaining hypoinflated lungs appear generally clear. No pleural effusion is seen. There is a stable mediastinal configuration. No acute osseous abnormality is seen. Slight thoracic dextroscoliosis. IMPRESSION: Expiratory exam. Asymmetric increased left infrahilar opacity could be due to atelectasis, pneumonia or aspiration. Follow-up study recommended in full inspiration. Electronically Signed   By: Almira Bar M.D.   On: 07/27/2021 02:24        Scheduled Meds:  atorvastatin  10 mg Oral Daily   cyclobenzaprine  10 mg Oral QHS   enoxaparin (LOVENOX) injection  40 mg Subcutaneous Q24H   folic acid  1 mg Oral Daily   gabapentin  300 mg Oral TID   lidocaine  1 patch Transdermal Q24H   polyethylene glycol  17 g Oral Daily   Continuous Infusions:   LOS: 1 day    Time spent: 35 minutes    Dorcas Carrow, MD [facility] Pager [phone]

## 2021-07-28 NOTE — Progress Notes (Signed)
  Transition of Care [facility]) Screening Note   Patient Details  Name: Tina Huber Date of Birth: [birth year]   Transition of Care [facility]) CM/SW Contact:    Eduard Roux, LCSW Phone Number: 07/28/2021, 2:10 PM    Transition of Care Department [facility]) has reviewed patient and no TOC needs have been identified at this time. We will continue to monitor patient advancement through interdisciplinary progression rounds. If new patient transition needs arise, please place a TOC consult.

## 2021-07-29 DIAGNOSIS — A419 Sepsis, unspecified organism: Secondary | ICD-10-CM | POA: Diagnosis not present

## 2021-07-29 DIAGNOSIS — R6520 Severe sepsis without septic shock: Secondary | ICD-10-CM | POA: Diagnosis not present

## 2021-07-29 LAB — PROCALCITONIN: Procalcitonin: 1.63 ng/mL

## 2021-07-29 LAB — TSH: TSH: 0.696 u[IU]/mL (ref 0.350–4.500)

## 2021-07-29 MED ORDER — FLEET ENEMA 7-19 GM/118ML RE ENEM
1.0000 | ENEMA | Freq: Once | RECTAL | Status: AC
  Administered 2021-07-29: 1 via RECTAL
  Filled 2021-07-29: qty 1

## 2021-07-29 MED ORDER — POLYETHYLENE GLYCOL 3350 17 G PO PACK
17.0000 g | PACK | Freq: Two times a day (BID) | ORAL | Status: DC
  Administered 2021-07-29 – 2021-07-31 (×4): 17 g via ORAL
  Filled 2021-07-29 (×4): qty 1

## 2021-07-29 MED ORDER — SENNA 8.6 MG PO TABS
1.0000 | ORAL_TABLET | Freq: Two times a day (BID) | ORAL | Status: DC
  Administered 2021-07-29 – 2021-07-31 (×5): 8.6 mg via ORAL
  Filled 2021-07-29 (×5): qty 1

## 2021-07-29 NOTE — Progress Notes (Signed)
PROGRESS NOTE  Kacy Hegna  ZOX:[id number] DOB: 1975/08/14 DOA: 07/27/2021 PCP: Ivonne Andrew, NP   Brief Narrative: Patient is a 46 year old female with history of sickle cell trait, rheumatoid arthritis, chronic back pain, nonprogressive HIV who presented with abdominal pain, pelvic pain, fever, constipation.  On presentation she was tachycardic, febrile but blood pressure was stable.  Lab work showed elevated lactic acid of 3.6, leukocytosis.  CT imaging did not show any acute intra-abdominal/pelvic findings.  Infectious work-up was started, ID consulted without finding of any acute infectious process, ID signed off.  Still having abdominal discomfort, trying enema today for bowel movement.  Assessment & Plan:  Principal Problem:   Severe sepsis (HCC) Active Problems:   Fever  Fever of unknown origin: No clear source.  Elevated procalcitonin.  Followed by ID.  Initially given antibiotics in the ER, now off.  Suspected viral illness.  Blood cultures have not shown any growth.  Currently febrile. CT imagings did not show any acute findings, MRI of the spine normal. ID signed off.  Still has leukocytosis.  Lactate is normalized.  Improvement in the liver enzymes  Abdominal discomfort/constipation: This is most likely from constipation.  Abdomen is distended.  No gross tenderness.  Good bowel sounds.  Abdominal CT did not show any acute findings.  X-ray of the abdomen showed constipation without any focal obstruction or any lesions.  We will try enema today.  Continue aggressive bowel regimen  Obesity: BMI 30.3  Sinus tachycardia: Continue to monitor. We will check TSH  Nonprogressive HIV: Currently off medications.  We recommend follow-up with ID as an outpatient            DVT prophylaxis:enoxaparin (LOVENOX) injection 40 mg Start: 07/27/21 1015 SCDs Start: 07/27/21 0547     Code Status: Full Code  Family Communication: None at bedside  Patient  status:Inpatient  Patient is from :Home  Anticipated discharge WJ:XBJY  Estimated DC date: After resolution of abdomen pain, constipation   Consultants: ID  Procedures: None  Antimicrobials:  Anti-infectives (From admission, onward)    Start     Dose/Rate Route Frequency Ordered Stop   07/27/21 1800  vancomycin (VANCOCIN) IVPB 1000 mg/200 mL premix  Status:  Discontinued        1,000 mg 200 mL/hr over 60 Minutes Intravenous Every 12 hours 07/27/21 0626 07/27/21 0927   07/27/21 1200  ceFEPIme (MAXIPIME) 2 g in sodium chloride 0.9 % 100 mL IVPB  Status:  Discontinued        2 g 200 mL/hr over 30 Minutes Intravenous Every 8 hours 07/27/21 0626 07/27/21 0927   07/27/21 0230  vancomycin (VANCOREADY) IVPB 1500 mg/300 mL        1,500 mg 150 mL/hr over 120 Minutes Intravenous  Once 07/27/21 0156 07/27/21 0627   07/27/21 0200  ceFEPIme (MAXIPIME) 2 g in sodium chloride 0.9 % 100 mL IVPB        2 g 200 mL/hr over 30 Minutes Intravenous  Once 07/27/21 0151 07/27/21 0400   07/27/21 0200  metroNIDAZOLE (FLAGYL) IVPB 500 mg        500 mg 100 mL/hr over 60 Minutes Intravenous  Once 07/27/21 0151 07/27/21 0406   07/27/21 0200  vancomycin (VANCOCIN) IVPB 1000 mg/200 mL premix  Status:  Discontinued        1,000 mg 200 mL/hr over 60 Minutes Intravenous  Once 07/27/21 0151 07/27/21 0156       Subjective:  Patient seen and examined at bedside this morning.  Hemodynamically stable.  In sinus tachycardia, complains of abdominal discomfort.  Lack of bowel movement.  Last bowel movement was on Wednesday.   Objective: Vitals:   07/28/21 2052 07/29/21 0035 07/29/21 0414 07/29/21 0818  BP: 124/89 117/79 105/75 124/90  Pulse: (!) 127 (!) 118 (!) 113 (!) 112  Resp: 20 19 20 17   Temp: 99.1 F (37.3 C) 98.7 F (37.1 C) 98.4 F (36.9 C) 98.5 F (36.9 C)  TempSrc: Oral Oral Oral Oral  SpO2: 97% 100% 97% 96%  Weight:      Height:        Intake/Output Summary (Last 24 hours) at 07/29/2021  1141 Last data filed at 07/28/2021 2000 Gross per 24 hour  Intake 1470 ml  Output 3400 ml  Net -1930 ml   Filed Weights   07/27/21 0212 07/28/21 0500  Weight: 68 kg 68.1 kg    Examination:  General exam: Overall comfortable, not in distress obese HEENT: PERRL Respiratory system:  no wheezes or crackles  Cardiovascular system: S1 & S2 heard, RRR.  Gastrointestinal system: Abdomen is distended, soft and nontender.  Good bowel sounds Central nervous system: Alert and oriented Extremities: No edema, no clubbing ,no cyanosis Skin: No rashes, no ulcers,no icterus     Data Reviewed: I have personally reviewed following labs and imaging studies  CBC: Recent Labs  Lab 07/27/21 0109 07/28/21 0157  WBC 13.5* 15.8*  NEUTROABS 11.7*  --   HGB 12.5 10.8*  HCT 37.0 31.3*  MCV 86.7 85.8  PLT 243 211   Basic Metabolic Panel: Recent Labs  Lab 07/27/21 0109  NA 136  K 3.9  CL 96*  CO2 25  GLUCOSE 186*  BUN 6  CREATININE 0.86  CALCIUM 9.3     Recent Results (from the past 240 hour(s))  Resp Panel by RT-PCR (Flu A&B, Covid) Anterior Nasal Swab     Status: None   Collection Time: 07/27/21  1:51 AM   Specimen: Anterior Nasal Swab  Result Value Ref Range Status   SARS Coronavirus 2 by RT PCR NEGATIVE NEGATIVE Final    Comment: (NOTE) SARS-CoV-2 target nucleic acids are NOT DETECTED.  The SARS-CoV-2 RNA is generally detectable in upper respiratory specimens during the acute phase of infection. The lowest concentration of SARS-CoV-2 viral copies this assay can detect is 138 copies/mL. A negative result does not preclude SARS-Cov-2 infection and should not be used as the sole basis for treatment or other patient management decisions. A negative result may occur with  improper specimen collection/handling, submission of specimen other than nasopharyngeal swab, presence of viral mutation(s) within the areas targeted by this assay, and inadequate number of viral copies(<138  copies/mL). A negative result must be combined with clinical observations, patient history, and epidemiological information. The expected result is Negative.  Fact Sheet for Patients:  07/29/21  Fact Sheet for Healthcare Providers:  BloggerCourse.com  This test is no t yet approved or cleared by the [place] FDA and  has been authorized for detection and/or diagnosis of SARS-CoV-2 by FDA under an Emergency Use Authorization (EUA). This EUA will remain  in effect (meaning this test can be used) for the duration of the COVID-19 declaration under Section 564(b)(1) of the Act, 21 U.S.C.section 360bbb-3(b)(1), unless the authorization is terminated  or revoked sooner.       Influenza A by PCR NEGATIVE NEGATIVE Final   Influenza B by PCR NEGATIVE NEGATIVE Final    Comment: (NOTE) The Xpert Xpress SARS-CoV-2/FLU/RSV plus assay is  intended as an aid in the diagnosis of influenza from Nasopharyngeal swab specimens and should not be used as a sole basis for treatment. Nasal washings and aspirates are unacceptable for Xpert Xpress SARS-CoV-2/FLU/RSV testing.  Fact Sheet for Patients: BloggerCourse.com  Fact Sheet for Healthcare Providers: SeriousBroker.it  This test is not yet approved or cleared by the [place] FDA and has been authorized for detection and/or diagnosis of SARS-CoV-2 by FDA under an Emergency Use Authorization (EUA). This EUA will remain in effect (meaning this test can be used) for the duration of the COVID-19 declaration under Section 564(b)(1) of the Act, 21 U.S.C. section 360bbb-3(b)(1), unless the authorization is terminated or revoked.  Performed at [facility], [address] [place], [place] [address]   Blood Culture (routine x 2)     Status: None (Preliminary result)   Collection Time: 07/27/21  2:06 AM   Specimen: BLOOD  Result  Value Ref Range Status   Specimen Description BLOOD RIGHT ANTECUBITAL  Final   Special Requests   Final    BOTTLES DRAWN AEROBIC AND ANAEROBIC Blood Culture adequate volume   Culture   Final    NO GROWTH 2 DAYS Performed at [facility], [address] [place], [place] [address]    Report Status PENDING  Incomplete  Blood Culture (routine x 2)     Status: None (Preliminary result)   Collection Time: 07/27/21  2:18 AM   Specimen: BLOOD LEFT FOREARM  Result Value Ref Range Status   Specimen Description BLOOD LEFT FOREARM  Final   Special Requests   Final    BOTTLES DRAWN AEROBIC AND ANAEROBIC Blood Culture adequate volume   Culture   Final    NO GROWTH 2 DAYS Performed at [facility], [address], [place], [place] [address]    Report Status PENDING  Incomplete  Urine Culture     Status: Abnormal   Collection Time: 07/27/21  3:58 AM   Specimen: In/Out Cath Urine  Result Value Ref Range Status   Specimen Description IN/OUT CATH URINE  Final   Special Requests   Final    NONE Performed at [facility], [address] [place], [place] [address]    Culture MULTIPLE SPECIES PRESENT, SUGGEST RECOLLECTION (A)  Final   Report Status 07/28/2021 FINAL  Final     Radiology Studies: DG Abd 2 Views  Result Date: 07/28/2021 CLINICAL DATA:  Right-sided abdominal pain EXAM: ABDOMEN - 2 VIEW COMPARISON:  None Available. FINDINGS: Scattered large and small bowel gas is noted. Retained fecal material in the right colon is noted consistent with some mild constipation. No obstructive changes are seen. No free air is noted. No bony abnormality is seen. IMPRESSION: Mild right-sided colonic constipation. No other focal abnormality is seen. Electronically Signed   By: Alcide Clever M.D.   On: 07/28/2021 22:12    Scheduled Meds:  atorvastatin  10 mg Oral Daily   cyclobenzaprine  10 mg Oral QHS   enoxaparin (LOVENOX) injection  40 mg Subcutaneous Q24H   folic acid  1 mg Oral Daily    gabapentin  300 mg Oral TID   lidocaine  1 patch Transdermal Q24H   polyethylene glycol  17 g Oral Daily   sodium phosphate  1 enema Rectal Once   Continuous Infusions:   LOS: 2 days   Burnadette Pop, MD [facility] P7/01/2021, 11:41 AM

## 2021-07-30 ENCOUNTER — Inpatient Hospital Stay (HOSPITAL_COMMUNITY): Payer: Managed Care, Other (non HMO)

## 2021-07-30 DIAGNOSIS — R9431 Abnormal electrocardiogram [ECG] [EKG]: Secondary | ICD-10-CM

## 2021-07-30 DIAGNOSIS — R6520 Severe sepsis without septic shock: Secondary | ICD-10-CM | POA: Diagnosis not present

## 2021-07-30 DIAGNOSIS — A419 Sepsis, unspecified organism: Secondary | ICD-10-CM | POA: Diagnosis not present

## 2021-07-30 LAB — BASIC METABOLIC PANEL
Anion gap: 8 (ref 5–15)
BUN: <5 mg/dL — ABNORMAL LOW (ref 6–20)
CO2: 26 mmol/L (ref 22–32)
Calcium: 9 mg/dL (ref 8.9–10.3)
Chloride: 101 mmol/L (ref 98–111)
Creatinine, Ser: 0.8 mg/dL (ref 0.44–1.00)
GFR, Estimated: >60 mL/min (ref >60)
Glucose, Bld: 161 mg/dL — ABNORMAL HIGH (ref 70–99)
Potassium: 3.9 mmol/L (ref 3.5–5.1)
Sodium: 135 mmol/L (ref 135–145)

## 2021-07-30 LAB — CBC
HCT: 31.4 % — ABNORMAL LOW (ref 36.0–46.0)
Hemoglobin: 10.5 g/dL — ABNORMAL LOW (ref 12.0–15.0)
MCH: 28.5 pg (ref 26.0–34.0)
MCHC: 33.4 g/dL (ref 30.0–36.0)
MCV: 85.3 fL (ref 80.0–100.0)
Platelets: 259 10*3/uL (ref 150–400)
RBC: 3.68 MIL/uL — ABNORMAL LOW (ref 3.87–5.11)
RDW: 13.6 % (ref 11.5–15.5)
WBC: 7.9 10*3/uL (ref 4.0–10.5)
nRBC: 0 % (ref 0.0–0.2)

## 2021-07-30 LAB — ECHOCARDIOGRAM COMPLETE
Area-P 1/2: 6.63 cm2
Calc EF: 54.8 %
Height: 59 in
P 1/2 time: 402 msec
S' Lateral: 2.5 cm
Single Plane A2C EF: 54.7 %
Single Plane A4C EF: 54.4 %
Weight: 2402.13 oz

## 2021-07-30 MED ORDER — IOHEXOL 9 MG/ML PO SOLN
ORAL | Status: AC
  Administered 2021-07-30: 500 mL
  Filled 2021-07-30: qty 1000

## 2021-07-30 MED ORDER — IOHEXOL 300 MG/ML  SOLN
100.0000 mL | Freq: Once | INTRAMUSCULAR | Status: AC | PRN
  Administered 2021-07-30: 100 mL via INTRAVENOUS

## 2021-07-30 MED ORDER — ONDANSETRON HCL 4 MG/2ML IJ SOLN
4.0000 mg | Freq: Four times a day (QID) | INTRAMUSCULAR | Status: DC | PRN
  Administered 2021-07-30: 4 mg via INTRAVENOUS
  Filled 2021-07-30: qty 2

## 2021-07-30 NOTE — Progress Notes (Signed)
  Echocardiogram 2D Echocardiogram has been performed.  Tina Huber 07/30/2021, 2:55 PM

## 2021-07-30 NOTE — Progress Notes (Addendum)
PROGRESS NOTE  Tina Huber  DVV:[id number] DOB: 1975-10-01 DOA: 07/27/2021 PCP: Ivonne Andrew, NP   Brief Narrative: Patient is a 46 year old female with history of sickle cell trait, rheumatoid arthritis, chronic back pain, nonprogressive HIV who presented with abdominal pain, pelvic pain, fever, constipation.  On presentation she was tachycardic, febrile but blood pressure was stable.  Lab work showed elevated lactic acid of 3.6, leukocytosis.  CT imaging did not show any acute intra-abdominal/pelvic findings.  Infectious work-up was started, ID consulted without finding of any acute infectious process, ID signed off.  Still having abdominal discomfort on the right upper quadrant ,planning for CT abdomen/pelvis  Assessment & Plan:  Principal Problem:   Severe sepsis (HCC) Active Problems:   Fever  Fever of unknown origin: No clear source.  Elevated procalcitonin.  Followed by ID.  Initially given antibiotics in the ER, now off.  Suspected viral illness.  Blood cultures have not shown any growth.  Currently afebrile. CT imagings on admission did not show any acute findings, MRI of the spine normal. ID signed off.  Leukocytosis resolved lactate is normalized.  Improvement in the liver enzymes.  Abdominal discomfort/constipation:  Abdomen is distended. Good bowel sounds.  Abdominal CT on admission did not show any acute findings.  X-ray of the abdomen showed constipation without any focal obstruction or any lesions.  Continue aggressive bowel regimen.  She had a bowel movement yesterday which helped regarding her abdominal pain to some extent but she still complains of discomfort on the right upper quadrant.  There is tenderness on the right upper quadrant.  We will do a CT abdomen/pelvis with contrast  Obesity: BMI 30.3  Sinus tachycardia: Unclear etiology.  She says her heart runs fast since she took COVID-vaccine.  TSH normal.  Will check echo  Nonprogressive HIV: Currently off  medications.  We recommend follow-up with ID as an outpatient            DVT prophylaxis:enoxaparin (LOVENOX) injection 40 mg Start: 07/27/21 1015 SCDs Start: 07/27/21 0547     Code Status: Full Code  Family Communication: None at bedside  Patient status:Inpatient  Patient is from :Home  Anticipated discharge GY:IRSW  Estimated DC date: After resolution of abdomen pain.  Pending CT, echocardiogram   Consultants: ID  Procedures: None  Antimicrobials:  Anti-infectives (From admission, onward)    Start     Dose/Rate Route Frequency Ordered Stop   07/27/21 1800  vancomycin (VANCOCIN) IVPB 1000 mg/200 mL premix  Status:  Discontinued        1,000 mg 200 mL/hr over 60 Minutes Intravenous Every 12 hours 07/27/21 0626 07/27/21 0927   07/27/21 1200  ceFEPIme (MAXIPIME) 2 g in sodium chloride 0.9 % 100 mL IVPB  Status:  Discontinued        2 g 200 mL/hr over 30 Minutes Intravenous Every 8 hours 07/27/21 0626 07/27/21 0927   07/27/21 0230  vancomycin (VANCOREADY) IVPB 1500 mg/300 mL        1,500 mg 150 mL/hr over 120 Minutes Intravenous  Once 07/27/21 0156 07/27/21 0627   07/27/21 0200  ceFEPIme (MAXIPIME) 2 g in sodium chloride 0.9 % 100 mL IVPB        2 g 200 mL/hr over 30 Minutes Intravenous  Once 07/27/21 0151 07/27/21 0400   07/27/21 0200  metroNIDAZOLE (FLAGYL) IVPB 500 mg        500 mg 100 mL/hr over 60 Minutes Intravenous  Once 07/27/21 0151 07/27/21 0406   07/27/21 0200  vancomycin (VANCOCIN) IVPB 1000 mg/200 mL premix  Status:  Discontinued        1,000 mg 200 mL/hr over 60 Minutes Intravenous  Once 07/27/21 0151 07/27/21 0156       Subjective:  Patient seen and examined at the bedside this morning.  She feels more comfortable than yesterday but she still have discomfort on the right upper quadrant.  Had a bowel movement yesterday  Objective: Vitals:   07/30/21 0037 07/30/21 0400 07/30/21 0815 07/30/21 1155  BP: 125/82 122/81 (!) 123/91 (!) 110/93   Pulse: (!) 120 (!) 114 (!) 106 (!) 114  Resp: 18 18 17 19   Temp: 98.8 F (37.1 C) 98.4 F (36.9 C) 98 F (36.7 C) 98.7 F (37.1 C)  TempSrc: Oral Oral Oral Oral  SpO2: 98% 95% 97% 98%  Weight:  68.1 kg    Height:        Intake/Output Summary (Last 24 hours) at 07/30/2021 1418 Last data filed at 07/30/2021 1300 Gross per 24 hour  Intake 480 ml  Output 650 ml  Net -170 ml   Filed Weights   07/27/21 0212 07/28/21 0500 07/30/21 0400  Weight: 68 kg 68.1 kg 68.1 kg    Examination:  General exam: Overall comfortable, not in distress ,obese HEENT: PERRL Respiratory system:  no wheezes or crackles  Cardiovascular system: S1 & S2 heard, RRR.  Gastrointestinal system: Abdomen is distended, soft and tender on right upper quadrant. good bowel sounds Central nervous system: Alert and oriented Extremities: No edema, no clubbing ,no cyanosis Skin: No rashes, no ulcers,no icterus     Data Reviewed: I have personally reviewed following labs and imaging studies  CBC: Recent Labs  Lab 07/27/21 0109 07/28/21 0157 07/30/21 0327  WBC 13.5* 15.8* 7.9  NEUTROABS 11.7*  --   --   HGB 12.5 10.8* 10.5*  HCT 37.0 31.3* 31.4*  MCV 86.7 85.8 85.3  PLT 243 211 259   Basic Metabolic Panel: Recent Labs  Lab 07/27/21 0109 07/30/21 0327  NA 136 135  K 3.9 3.9  CL 96* 101  CO2 25 26  GLUCOSE 186* 161*  BUN 6 <5*  CREATININE 0.86 0.80  CALCIUM 9.3 9.0     Recent Results (from the past 240 hour(s))  Resp Panel by RT-PCR (Flu A&B, Covid) Anterior Nasal Swab     Status: None   Collection Time: 07/27/21  1:51 AM   Specimen: Anterior Nasal Swab  Result Value Ref Range Status   SARS Coronavirus 2 by RT PCR NEGATIVE NEGATIVE Final    Comment: (NOTE) SARS-CoV-2 target nucleic acids are NOT DETECTED.  The SARS-CoV-2 RNA is generally detectable in upper respiratory specimens during the acute phase of infection. The lowest concentration of SARS-CoV-2 viral copies this assay can detect  is 138 copies/mL. A negative result does not preclude SARS-Cov-2 infection and should not be used as the sole basis for treatment or other patient management decisions. A negative result may occur with  improper specimen collection/handling, submission of specimen other than nasopharyngeal swab, presence of viral mutation(s) within the areas targeted by this assay, and inadequate number of viral copies(<138 copies/mL). A negative result must be combined with clinical observations, patient history, and epidemiological information. The expected result is Negative.  Fact Sheet for Patients:  BloggerCourse.com  Fact Sheet for Healthcare Providers:  [employer]ious[employer]ker.it  This test is no t yet approved or cleared by the [place] FDA and  has been authorized for detection and/or diagnosis of SARS-CoV-2 by  FDA under an Emergency Use Authorization (EUA). This EUA will remain  in effect (meaning this test can be used) for the duration of the COVID-19 declaration under Section 564(b)(1) of the Act, 21 U.S.C.section 360bbb-3(b)(1), unless the authorization is terminated  or revoked sooner.       Influenza A by PCR NEGATIVE NEGATIVE Final   Influenza B by PCR NEGATIVE NEGATIVE Final    Comment: (NOTE) The Xpert Xpress SARS-CoV-2/FLU/RSV plus assay is intended as an aid in the diagnosis of influenza from Nasopharyngeal swab specimens and should not be used as a sole basis for treatment. Nasal washings and aspirates are unacceptable for Xpert Xpress SARS-CoV-2/FLU/RSV testing.  Fact Sheet for Patients: BloggerCourse.com  Fact Sheet for Healthcare Providers: SeriousBroker.it  This test is not yet approved or cleared by the [place] FDA and has been authorized for detection and/or diagnosis of SARS-CoV-2 by FDA under an Emergency Use Authorization (EUA). This EUA will remain in effect  (meaning this test can be used) for the duration of the COVID-19 declaration under Section 564(b)(1) of the Act, 21 U.S.C. section 360bbb-3(b)(1), unless the authorization is terminated or revoked.  Performed at [facility], [address] [place], [place] [address]   Blood Culture (routine x 2)     Status: None (Preliminary result)   Collection Time: 07/27/21  2:06 AM   Specimen: BLOOD  Result Value Ref Range Status   Specimen Description BLOOD RIGHT ANTECUBITAL  Final   Special Requests   Final    BOTTLES DRAWN AEROBIC AND ANAEROBIC Blood Culture adequate volume   Culture   Final    NO GROWTH 3 DAYS Performed at [facility], [address] [place], [place] [address]    Report Status PENDING  Incomplete  Blood Culture (routine x 2)     Status: None (Preliminary result)   Collection Time: 07/27/21  2:18 AM   Specimen: BLOOD LEFT FOREARM  Result Value Ref Range Status   Specimen Description BLOOD LEFT FOREARM  Final   Special Requests   Final    BOTTLES DRAWN AEROBIC AND ANAEROBIC Blood Culture adequate volume   Culture   Final    NO GROWTH 3 DAYS Performed at [facility], [address], [place], [place] [address]    Report Status PENDING  Incomplete  Urine Culture     Status: Abnormal   Collection Time: 07/27/21  3:58 AM   Specimen: In/Out Cath Urine  Result Value Ref Range Status   Specimen Description IN/OUT CATH URINE  Final   Special Requests   Final    NONE Performed at [facility], [address], [place], [place] [address]    Culture MULTIPLE SPECIES PRESENT, SUGGEST RECOLLECTION (A)  Final   Report Status 07/28/2021 FINAL  Final     Radiology Studies: DG Abd 2 Views  Result Date: 07/28/2021 CLINICAL DATA:  Right-sided abdominal pain EXAM: ABDOMEN - 2 VIEW COMPARISON:  None Available. FINDINGS: Scattered large and small bowel gas is noted. Retained fecal material in the right colon is noted consistent with some mild constipation.  No obstructive changes are seen. No free air is noted. No bony abnormality is seen. IMPRESSION: Mild right-sided colonic constipation. No other focal abnormality is seen. Electronically Signed   By: Alcide Clever M.D.   On: 07/28/2021 22:12    Scheduled Meds:  atorvastatin  10 mg Oral Daily   cyclobenzaprine  10 mg Oral QHS   enoxaparin (LOVENOX)  injection  40 mg Subcutaneous Q24H   folic acid  1 mg Oral Daily   gabapentin  300 mg Oral TID   lidocaine  1 patch Transdermal Q24H   polyethylene glycol  17 g Oral BID   senna  1 tablet Oral BID   Continuous Infusions:   LOS: 3 days   Burnadette Pop, MD [facility] P7/03/2021, 2:18 PM

## 2021-07-31 DIAGNOSIS — A419 Sepsis, unspecified organism: Secondary | ICD-10-CM | POA: Diagnosis not present

## 2021-07-31 DIAGNOSIS — R6520 Severe sepsis without septic shock: Secondary | ICD-10-CM | POA: Diagnosis not present

## 2021-07-31 MED ORDER — POLYETHYLENE GLYCOL 3350 17 G PO PACK: 17.0000 g | PACK | Freq: Every day | ORAL | 0 refills | Status: DC

## 2021-07-31 MED ORDER — SENNA 8.6 MG PO TABS: 1.0000 | ORAL_TABLET | Freq: Two times a day (BID) | ORAL | 0 refills | Status: DC

## 2021-07-31 MED ORDER — METOPROLOL TARTRATE 25 MG PO TABS: 12.5000 mg | ORAL_TABLET | Freq: Two times a day (BID) | ORAL | 1 refills | Status: DC

## 2021-07-31 MED ORDER — METOPROLOL TARTRATE 12.5 MG HALF TABLET
12.5000 mg | ORAL_TABLET | Freq: Two times a day (BID) | ORAL | Status: DC
  Administered 2021-07-31: 12.5 mg via ORAL
  Filled 2021-07-31: qty 1

## 2021-07-31 MED ORDER — ONDANSETRON HCL 4 MG PO TABS: 4.0000 mg | ORAL_TABLET | Freq: Every day | ORAL | 0 refills | Status: DC | PRN

## 2021-07-31 NOTE — Discharge Summary (Signed)
Physician Discharge Summary  Tina Huber UJW:[id number] DOB: 03-Aug-1975 DOA: 07/27/2021  PCP: Ivonne Andrew, NP  Admit date: 07/27/2021 Discharge date: 07/31/2021  Admitted From: Home Disposition:  Home  Discharge Condition:Stable CODE STATUS:FULL Diet recommendation: Heart Healthy   Brief/Interim Summary:  Patient is a 46 year old female with history of sickle cell trait, rheumatoid arthritis, chronic back pain, nonprogressive HIV who presented with abdominal pain, pelvic pain, fever, constipation.  On presentation she was tachycardic, febrile but blood pressure was stable.  Lab work showed elevated lactic acid of 3.6, leukocytosis.  CT imaging did not show any acute intra-abdominal/pelvic findings.  Infectious work-up was started, ID consulted without finding of any acute infectious process, ID signed off. Marland Kitchen  Hospital course remarkable for persistent abdominal pain, relieved with bowel movement, imagings did not show any acute findings.  Medically stable for discharge home today.  Following problems were addressed during her hospitalization:   Fever of unknown origin: No clear source.  Elevated procalcitonin. Followed by ID.  Initially given antibiotics in the ER, now off.  Suspected viral illness.  Blood cultures have not shown any growth.  Currently afebrile. CT imagings on admission did not show any acute findings, MRI of the spine normal. ID signed off.  Leukocytosis resolved, lactate is normalized.  Improvement in the liver enzymes.   Abdominal discomfort/constipation: Good bowel sounds.  Abdominal CT on admission did not show any acute findings.  X-ray of the abdomen showed constipation without any focal obstruction or any lesions.  Repeat CT abdomen/pelvis with contrast did not show any acute findings.   Continue aggressive bowel regimen.     Obesity: BMI 30.3   Sinus tachycardia: Unclear etiology.  She says her heart runs fast since she took COVID-vaccine.  TSH normal.  Echo  did not show any abnormality.  Started on low-dose metoprolol.   Nonprogressive HIV: Currently off medications.  We recommend follow-up with ID as an outpatient  Discharge Diagnoses:  Principal Problem:   Severe sepsis [facility]) Active Problems:   Fever    Discharge Instructions  Discharge Instructions     Diet general   Complete by: As directed    Discharge instructions   Complete by: As directed    1)Please take prescribed medications as instructed 2)Follow up with your PCP in a week   Increase activity slowly   Complete by: As directed       Allergies as of 07/31/2021       Reactions   Latex    Gloves- made hands itch and burn   Quinine    Other reaction(s): itching Other reaction(s): itching   Quinine Derivatives Itching        Medication List     TAKE these medications    acetaminophen 500 MG tablet Commonly known as: TYLENOL Take 1,000 mg by mouth every 6 (six) hours as needed for mild pain.   albuterol (2.5 MG/3ML) 0.083% nebulizer solution Commonly known as: PROVENTIL Take 3 mLs (2.5 mg total) by nebulization every 4 (four) hours as needed for wheezing or shortness of breath.   albuterol 108 (90 Base) MCG/ACT inhaler Commonly known as: VENTOLIN HFA Inhale 2 puffs into the lungs every 6 (six) hours as needed for wheezing or shortness of breath.   atorvastatin 10 MG tablet Commonly known as: Lipitor Take 1 tablet (10 mg total) by mouth daily.   celecoxib 200 MG capsule Commonly known as: CELEBREX Take 200 mg by mouth 2 (two) times daily.   cholecalciferol 25 MCG (1000  UNIT) tablet Commonly known as: VITAMIN D3 Take 1,000 Units by mouth daily.   cyclobenzaprine 10 MG tablet Commonly known as: FLEXERIL Take 10 mg by mouth at bedtime.   folic acid 1 MG tablet Commonly known as: FOLVITE Take 1 mg by mouth daily.   gabapentin 300 MG capsule Commonly known as: NEURONTIN Take 1 capsule (300 mg total) by mouth 3 (three) times daily.   ibuprofen  200 MG tablet Commonly known as: ADVIL Take 400 mg by mouth every 6 (six) hours as needed for mild pain.   lidocaine 4 % Apply 1 patch topically daily.   metoprolol tartrate 25 MG tablet Commonly known as: LOPRESSOR Take 0.5 tablets (12.5 mg total) by mouth 2 (two) times daily.   multivitamin with minerals tablet Take 1 tablet by mouth daily.   ondansetron 4 MG tablet Commonly known as: Zofran Take 1 tablet (4 mg total) by mouth daily as needed for nausea or vomiting.   oxyCODONE-acetaminophen 5-325 MG tablet Commonly known as: PERCOCET/ROXICET Take 1 tablet by mouth every 6 (six) hours as needed for severe pain.   polyethylene glycol 17 g packet Commonly known as: MIRALAX / GLYCOLAX Take 17 g by mouth daily.   senna 8.6 MG Tabs tablet Commonly known as: SENOKOT Take 1 tablet (8.6 mg total) by mouth 2 (two) times daily.   Voltaren Arthritis Pain 1 % Gel Generic drug: diclofenac Sodium Apply 2 g topically 2 (two) times daily as needed (pain).        Follow-up Information     Ivonne Andrew, NP. Schedule an appointment as soon as possible for a visit in 1 week(s).   Specialty: Pulmonary Disease Contact information: [address], [address] [place] [address] [phone]                Allergies  Allergen Reactions   Latex     Gloves- made hands itch and burn   Quinine     Other reaction(s): itching Other reaction(s): itching   Quinine Derivatives Itching    Consultations: ID   Procedures/Studies: CT ABDOMEN PELVIS W CONTRAST  Result Date: 07/30/2021 CLINICAL DATA:  Severe right-sided abdominal pain. Sickle cell disease. HIV. EXAM: CT ABDOMEN AND PELVIS WITH CONTRAST TECHNIQUE: Multidetector CT imaging of the abdomen and pelvis was performed using the standard protocol following bolus administration of intravenous contrast. RADIATION DOSE REDUCTION: This exam was performed according to the departmental dose-optimization program which includes  automated exposure control, adjustment of the mA and/or kV according to patient size and/or use of iterative reconstruction technique. CONTRAST:  OMNIPAQUE IOHEXOL 300 MG/ML  SOLN COMPARISON:  CTA on 07/27/2021 FINDINGS: Lower Chest: Mild right lower lobe atelectasis. Hepatobiliary: No hepatic masses identified. Gallbladder is unremarkable. No evidence of biliary ductal dilatation. Pancreas:  No mass or inflammatory changes. Spleen: Within normal limits in size and appearance. Adrenals/Urinary Tract: No masses identified. No evidence of ureteral calculi or hydronephrosis. Stomach/Bowel: No evidence of obstruction, inflammatory process or abnormal fluid collections. Normal appendix visualized. Vascular/Lymphatic: No pathologically enlarged lymph nodes. No acute vascular findings. Reproductive:  No mass or other significant abnormality. Other:  None. Musculoskeletal:  No suspicious bone lesions identified. IMPRESSION: No acute findings within the abdomen or pelvis. Mild right lower lobe atelectasis. Electronically Signed   By: Danae Orleans M.D.   On: 07/30/2021 18:43   ECHOCARDIOGRAM COMPLETE  Result Date: 07/30/2021    ECHOCARDIOGRAM REPORT   Patient Name:   Tina Huber Date of Exam: 07/30/2021 Medical  Rec #:  [id number]    Height:       59.0 in Accession #:    [id number]   Weight:       150.1 lb Date of Birth:  Oct 31, 1975    BSA:          1.633 m Patient Age:    45 years     BP:           110/93 mmHg Patient Gender: F            HR:           123 bpm. Exam Location:  Inpatient Procedure: 2D Echo, 3D Echo, Cardiac Doppler and Color Doppler Indications:    R94.31 Abnormal EKG  History:        Patient has no prior history of Echocardiogram examinations.                 Abnormal ECG, Arrythmias:Tachycardia; Signs/Symptoms:Bacteremia.  Sonographer:    Sheralyn Boatman RDCS Referring Phys: Burnadette Pop  Sonographer Comments: Patient is morbidly obese. Image acquisition challenging due to patient body habitus.  IMPRESSIONS  1. Left ventricular ejection fraction, by estimation, is 60 to 65%. The left ventricle has normal function. The left ventricle has no regional wall motion abnormalities. Left ventricular diastolic parameters are indeterminate. Elevated left atrial pressure.  2. Right ventricular systolic function is normal. The right ventricular size is normal.  3. The mitral valve is normal in structure. Trivial mitral valve regurgitation. No evidence of mitral stenosis.  4. The aortic valve is tricuspid. Aortic valve regurgitation is mild. Aortic valve sclerosis is present, with no evidence of aortic valve stenosis.  5. The inferior vena cava is normal in size with greater than 50% respiratory variability, suggesting right atrial pressure of 3 mmHg. FINDINGS  Left Ventricle: Left ventricular ejection fraction, by estimation, is 60 to 65%. The left ventricle has normal function. The left ventricle has no regional wall motion abnormalities. The left ventricular internal cavity size was normal in size. There is  no left ventricular hypertrophy. Left ventricular diastolic parameters are indeterminate. Elevated left atrial pressure. Right Ventricle: The right ventricular size is normal. Right ventricular systolic function is normal. Left Atrium: Left atrial size was normal in size. Right Atrium: Right atrial size was normal in size. Pericardium: There is no evidence of pericardial effusion. Mitral Valve: The mitral valve is normal in structure. Trivial mitral valve regurgitation. No evidence of mitral valve stenosis. Tricuspid Valve: The tricuspid valve is normal in structure. Tricuspid valve regurgitation is trivial. No evidence of tricuspid stenosis. Aortic Valve: The aortic valve is tricuspid. Aortic valve regurgitation is mild. Aortic regurgitation PHT measures 402 msec. Aortic valve sclerosis is present, with no evidence of aortic valve stenosis. Pulmonic Valve: The pulmonic valve was normal in structure. Pulmonic  valve regurgitation is trivial. No evidence of pulmonic stenosis. Aorta: The aortic root is normal in size and structure. Venous: The inferior vena cava is normal in size with greater than 50% respiratory variability, suggesting right atrial pressure of 3 mmHg. IAS/Shunts: No atrial level shunt detected by color flow Doppler.  LEFT VENTRICLE PLAX 2D LVIDd:         3.70 cm     Diastology LVIDs:         2.50 cm     LV e' medial:    3.42 cm/s LV PW:         1.00 cm     LV E/e' medial:  30.3 LV IVS:  1.10 cm     LV e' lateral:   7.77 cm/s LVOT diam:     2.00 cm     LV E/e' lateral: 13.3 LV SV:         63 LV SV Index:   38 LVOT Area:     3.14 cm                             3D Volume EF: LV Volumes (MOD)           3D EF:        51 % LV vol d, MOD A2C: 61.6 ml LV EDV:       83 ml LV vol d, MOD A4C: 51.7 ml LV ESV:       40 ml LV vol s, MOD A2C: 27.9 ml LV SV:        43 ml LV vol s, MOD A4C: 23.6 ml LV SV MOD A2C:     33.7 ml LV SV MOD A4C:     51.7 ml LV SV MOD BP:      31.4 ml RIGHT VENTRICLE             IVC RV S prime:     19.90 cm/s  IVC diam: 1.40 cm TAPSE (M-mode): 2.7 cm LEFT ATRIUM             Index        RIGHT ATRIUM          Index LA diam:        2.90 cm 1.78 cm/m   RA Area:     9.61 cm LA Vol (A2C):   18.8 ml 11.51 ml/m  RA Volume:   17.70 ml 10.84 ml/m LA Vol (A4C):   23.5 ml 14.39 ml/m LA Biplane Vol: 23.0 ml 14.09 ml/m  AORTIC VALVE             PULMONIC VALVE LVOT Vmax:   106.00 cm/s PR End Diast Vel: 1.69 msec LVOT Vmean:  70.200 cm/s LVOT VTI:    0.199 m AI PHT:      402 msec  AORTA Ao Root diam: 2.90 cm Ao Asc diam:  3.40 cm MITRAL VALVE MV Area (PHT): 6.63 cm     SHUNTS MV Decel Time: 115 msec     Systemic VTI:  0.20 m MV E velocity: 103.50 cm/s  Systemic Diam: 2.00 cm Olga Millers MD Electronically signed by Olga Millers MD Signature Date/Time: 07/30/2021/3:00:39 PM    Final    DG Abd 2 Views  Result Date: 07/28/2021 CLINICAL DATA:  Right-sided abdominal pain EXAM: ABDOMEN - 2 VIEW  COMPARISON:  None Available. FINDINGS: Scattered large and small bowel gas is noted. Retained fecal material in the right colon is noted consistent with some mild constipation. No obstructive changes are seen. No free air is noted. No bony abnormality is seen. IMPRESSION: Mild right-sided colonic constipation. No other focal abnormality is seen. Electronically Signed   By: Alcide Clever M.D.   On: 07/28/2021 22:12   MR Lumbar Spine W Wo Contrast  Result Date: 07/27/2021 CLINICAL DATA:  46 year old female with HIV.  Sepsis, low back pain. EXAM: MRI LUMBAR SPINE WITHOUT AND WITH CONTRAST TECHNIQUE: Multiplanar and multiecho pulse sequences of the lumbar spine were obtained without and with intravenous contrast. CONTRAST:  7mL GADAVIST GADOBUTROL 1 MMOL/ML IV SOLN COMPARISON:  CTA chest abdomen and pelvis 0252 hours today. FINDINGS: Segmentation:  Normal on the comparison today. Alignment: Relatively normal lumbar lordosis. No spondylolisthesis. Vertebrae: Visualized bone marrow signal is within normal limits. No marrow edema or evidence of acute osseous abnormality. Intact visible sacrum and SI joints. Conus medullaris and cauda equina: Conus extends to the L1-L2 level. No lower spinal cord or conus signal abnormality. No abnormal intradural enhancement or dural thickening. Normal cauda equina nerve roots. Paraspinal and other soft tissues: Negative aside from distended urinary bladder (series 6, image 9). Disc levels: Normal for age intervertebral disc signal and morphology from T11-T12 through L5-S1. Intermittent lumbar facet hypertrophy, mild to moderate at L1-L2, L2-L3, L5-S1. No spinal or lateral recess stenosis. Mild bilateral L5 neural foraminal stenosis. IMPRESSION: 1. No acute or inflammatory process in the Lumbar spine. Mild for age spinal degeneration. No spinal stenosis. 2. Distended urinary bladder similar to the CTA earlier today. Query urinary retention. Electronically Signed   By: Odessa Fleming M.D.   On:  07/27/2021 07:12   CT Angio Chest/Abd/Pel for Dissection W and/or Wo Contrast  Result Date: 07/27/2021 CLINICAL DATA:  Fever, chest pain, abdominal pain out of proportion to examination. Sickle cell disease, un treated HIV infection EXAM: CT ANGIOGRAPHY CHEST, ABDOMEN AND PELVIS TECHNIQUE: Non-contrast CT of the chest was initially obtained. Multidetector CT imaging through the chest, abdomen and pelvis was performed using the standard protocol during bolus administration of intravenous contrast. Multiplanar reconstructed images and MIPs were obtained and reviewed to evaluate the vascular anatomy. RADIATION DOSE REDUCTION: This exam was performed according to the departmental dose-optimization program which includes automated exposure control, adjustment of the mA and/or kV according to patient size and/or use of iterative reconstruction technique. CONTRAST:  OMNIPAQUE IOHEXOL 350 MG/ML SOLN COMPARISON:  CT abdomen pelvis 02/20/2015 FINDINGS: CTA CHEST FINDINGS Cardiovascular: Preferential opacification of the thoracic aorta. No evidence of thoracic aortic aneurysm or dissection. Normal heart size. No pericardial effusion. Mediastinum/Nodes: No enlarged mediastinal, hilar, or axillary lymph nodes. Thyroid gland, trachea, and esophagus demonstrate no significant findings. Lungs/Pleura: Lungs are clear. No pleural effusion or pneumothorax. Musculoskeletal: No chest wall abnormality. No acute or significant osseous findings. Review of the MIP images confirms the above findings. CTA ABDOMEN AND PELVIS FINDINGS VASCULAR Aorta: Normal caliber aorta without aneurysm, dissection, vasculitis or significant stenosis. Celiac: Patent without evidence of aneurysm, dissection, vasculitis or significant stenosis. SMA: Patent without evidence of aneurysm, dissection, vasculitis or significant stenosis. Renals: Both renal arteries are patent without evidence of aneurysm, dissection, vasculitis, fibromuscular dysplasia or  significant stenosis. IMA: Patent without evidence of aneurysm, dissection, vasculitis or significant stenosis. Inflow: Patent without evidence of aneurysm, dissection, vasculitis or significant stenosis. Veins: No obvious venous abnormality within the limitations of this arterial phase study. Review of the MIP images confirms the above findings. NON-VASCULAR Hepatobiliary: At least mild hepatic steatosis. No enhancing intrahepatic mass. No intra or extrahepatic biliary ductal dilation. Gallbladder unremarkable. Pancreas: Unremarkable Spleen: Unremarkable Adrenals/Urinary Tract: The adrenal glands are unremarkable. The kidneys are normal. The bladder is mildly distended, but is otherwise unremarkable. Stomach/Bowel: Stomach is within normal limits. Appendix appears normal. No evidence of bowel wall thickening, distention, or inflammatory changes. Lymphatic: No pathologic adenopathy within the abdomen and pelvis Reproductive: Uterus and bilateral adnexa are unremarkable. Other: Tiny fat containing umbilical hernia Musculoskeletal: No acute bone abnormality. No lytic or blastic bone lesion. Review of the MIP images confirms the above findings. IMPRESSION: 1. No evidence of thoracoabdominal aortic aneurysm or dissection. 2. No acute intrathoracic or intra-abdominal pathology identified. 3. Mild hepatic steatosis.  4. Mild bladder distension, possibly related to voluntary retention or bladder outlet obstruction. Electronically Signed   By: Helyn Numbers M.D.   On: 07/27/2021 03:13   DG Chest Port 1 View  Result Date: 07/27/2021 CLINICAL DATA:  Questionable sepsis. EXAM: PORTABLE CHEST 1 VIEW COMPARISON:  PA Lat 02/24/2021. FINDINGS: The lungs are expiratory. The heart size is normal for expiration and no vascular congestion is seen. There is streaky infrahilar opacity in the left lower lobe which could be due to low lung volumes, pneumonia or aspiration. The remaining hypoinflated lungs appear generally clear. No  pleural effusion is seen. There is a stable mediastinal configuration. No acute osseous abnormality is seen. Slight thoracic dextroscoliosis. IMPRESSION: Expiratory exam. Asymmetric increased left infrahilar opacity could be due to atelectasis, pneumonia or aspiration. Follow-up study recommended in full inspiration. Electronically Signed   By: Almira Bar M.D.   On: 07/27/2021 02:24      Subjective: Patient seen and examined at the bedside this morning.  Hemodynamically stable for discharge today.  Discharge Exam: Vitals:   07/31/21 0759 07/31/21 0900  BP: 104/75 117/87  Pulse: 81 (!) 110  Resp: 18   Temp: 98.3 F (36.8 C)   SpO2: 97% 98%   Vitals:   07/31/21 0033 07/31/21 0419 07/31/21 0759 07/31/21 0900  BP: (!) 137/92 105/60 104/75 117/87  Pulse: (!) 123 (!) 110 81 (!) 110  Resp:  18 18   Temp: 98.5 F (36.9 C) 98.6 F (37 C) 98.3 F (36.8 C)   TempSrc: Oral Oral Oral   SpO2: 99% 94% 97% 98%  Weight: 79.9 kg     Height:        General: Pt is alert, awake, not in acute distress Cardiovascular: RRR, S1/S2 +, no rubs, no gallops Respiratory: CTA bilaterally, no wheezing, no rhonchi Abdominal: Soft, NT, ND, bowel sounds + Extremities: no edema, no cyanosis    The results of significant diagnostics from this hospitalization (including imaging, microbiology, ancillary and laboratory) are listed below for reference.     Microbiology: Recent Results (from the past 240 hour(s))  Resp Panel by RT-PCR (Flu A&B, Covid) Anterior Nasal Swab     Status: None   Collection Time: 07/27/21  1:51 AM   Specimen: Anterior Nasal Swab  Result Value Ref Range Status   SARS Coronavirus 2 by RT PCR NEGATIVE NEGATIVE Final    Comment: (NOTE) SARS-CoV-2 target nucleic acids are NOT DETECTED.  The SARS-CoV-2 RNA is generally detectable in upper respiratory specimens during the acute phase of infection. The lowest concentration of SARS-CoV-2 viral copies this assay can detect is 138  copies/mL. A negative result does not preclude SARS-Cov-2 infection and should not be used as the sole basis for treatment or other patient management decisions. A negative result may occur with  improper specimen collection/handling, submission of specimen other than nasopharyngeal swab, presence of viral mutation(s) within the areas targeted by this assay, and inadequate number of viral copies(<138 copies/mL). A negative result must be combined with clinical observations, patient history, and epidemiological information. The expected result is Negative.  Fact Sheet for Patients:  BloggerCourse.com  Fact Sheet for Healthcare Providers:  SeriousBroker.it  This test is no t yet approved or cleared by the [place] FDA and  has been authorized for detection and/or diagnosis of SARS-CoV-2 by FDA under an Emergency Use Authorization (EUA). This EUA will remain  in effect (meaning this test can be used) for the duration of the COVID-19 declaration under Section  564(b)(1) of the Act, 21 U.S.C.section 360bbb-3(b)(1), unless the authorization is terminated  or revoked sooner.       Influenza A by PCR NEGATIVE NEGATIVE Final   Influenza B by PCR NEGATIVE NEGATIVE Final    Comment: (NOTE) The Xpert Xpress SARS-CoV-2/FLU/RSV plus assay is intended as an aid in the diagnosis of influenza from Nasopharyngeal swab specimens and should not be used as a sole basis for treatment. Nasal washings and aspirates are unacceptable for Xpert Xpress SARS-CoV-2/FLU/RSV testing.  Fact Sheet for Patients: BloggerCourse.com  Fact Sheet for Healthcare Providers: SeriousBroker.it  This test is not yet approved or cleared by the [place] FDA and has been authorized for detection and/or diagnosis of SARS-CoV-2 by FDA under an Emergency Use Authorization (EUA). This EUA will remain in effect (meaning  this test can be used) for the duration of the COVID-19 declaration under Section 564(b)(1) of the Act, 21 U.S.C. section 360bbb-3(b)(1), unless the authorization is terminated or revoked.  Performed at [facility], [address], [place], [place] [address]   Blood Culture (routine x 2)     Status: None (Preliminary result)   Collection Time: 07/27/21  2:06 AM   Specimen: BLOOD  Result Value Ref Range Status   Specimen Description BLOOD RIGHT ANTECUBITAL  Final   Special Requests   Final    BOTTLES DRAWN AEROBIC AND ANAEROBIC Blood Culture adequate volume   Culture   Final    NO GROWTH 3 DAYS Performed at [facility], [address] [place], [place] [address]    Report Status PENDING  Incomplete  Blood Culture (routine x 2)     Status: None (Preliminary result)   Collection Time: 07/27/21  2:18 AM   Specimen: BLOOD LEFT FOREARM  Result Value Ref Range Status   Specimen Description BLOOD LEFT FOREARM  Final   Special Requests   Final    BOTTLES DRAWN AEROBIC AND ANAEROBIC Blood Culture adequate volume   Culture   Final    NO GROWTH 3 DAYS Performed at [facility], [address] [place], [place] [address]    Report Status PENDING  Incomplete  Urine Culture     Status: Abnormal   Collection Time: 07/27/21  3:58 AM   Specimen: In/Out Cath Urine  Result Value Ref Range Status   Specimen Description IN/OUT CATH URINE  Final   Special Requests   Final    NONE Performed at [facility], [address], [place], [place] 07/03/1975    Culture MULTIPLE SPECIES PRESENT, SUGGEST RECOLLECTION (A)  Final   Report Status 07/28/2021 FINAL  Final     Labs: BNP (last 3 results) No results for input(s): "BNP" in the last 8760 hours. Basic Metabolic Panel: Recent Labs  Lab 07/27/21 0109 07/30/21 0327  NA 136 135  K 3.9 3.9  CL 96* 101  CO2 25 26  GLUCOSE 186* 161*  BUN 6 <5*  CREATININE 0.86 0.80  CALCIUM 9.3 9.0   Liver Function Tests: Recent  Labs  Lab 07/27/21 0109 07/28/21 0157  AST 64* 38  ALT 62* 47*  ALKPHOS 89 88  BILITOT 0.5 0.9  PROT 7.8 6.9  ALBUMIN 3.8 2.9*   No results for input(s): "LIPASE", "AMYLASE" in the last 168 hours. No results for input(s): "AMMONIA" in the last 168 hours. CBC: Recent Labs  Lab 07/27/21 0109 07/28/21 0157 07/30/21 0327  WBC 13.5* 15.8* 7.9  NEUTROABS 11.7*  --   --  HGB 12.5 10.8* 10.5*  HCT 37.0 31.3* 31.4*  MCV 86.7 85.8 85.3  PLT 243 211 259   Cardiac Enzymes: Recent Labs  Lab 07/27/21 0940  CKTOTAL 64   BNP: Invalid input(s): "POCBNP" CBG: No results for input(s): "GLUCAP" in the last 168 hours. D-Dimer No results for input(s): "DDIMER" in the last 72 hours. Hgb A1c No results for input(s): "HGBA1C" in the last 72 hours. Lipid Profile No results for input(s): "CHOL", "HDL", "LDLCALC", "TRIG", "CHOLHDL", "LDLDIRECT" in the last 72 hours. Thyroid function studies Recent Labs    07/29/21 1146  TSH 0.696   Anemia work up No results for input(s): "VITAMINB12", "FOLATE", "FERRITIN", "TIBC", "IRON", "RETICCTPCT" in the last 72 hours. Urinalysis    Component Value Date/Time   COLORURINE STRAW (A) 07/27/2021 0358   APPEARANCEUR CLEAR 07/27/2021 0358   LABSPEC 1.034 (H) 07/27/2021 0358   PHURINE 7.0 07/27/2021 0358   GLUCOSEU NEGATIVE 07/27/2021 0358   HGBUR NEGATIVE 07/27/2021 0358   BILIRUBINUR NEGATIVE 07/27/2021 0358   BILIRUBINUR negative 06/29/2021 1332   BILIRUBINUR neg 07/24/2019 0839   KETONESUR NEGATIVE 07/27/2021 0358   PROTEINUR NEGATIVE 07/27/2021 0358   UROBILINOGEN 0.2 06/29/2021 1332   UROBILINOGEN 0.2 01/09/2017 1154   NITRITE NEGATIVE 07/27/2021 0358   LEUKOCYTESUR NEGATIVE 07/27/2021 0358   Sepsis Labs Recent Labs  Lab 07/27/21 0109 07/28/21 0157 07/30/21 0327  WBC 13.5* 15.8* 7.9   Microbiology Recent Results (from the past 240 hour(s))  Resp Panel by RT-PCR (Flu A&B, Covid) Anterior Nasal Swab     Status: None   Collection  Time: 07/27/21  1:51 AM   Specimen: Anterior Nasal Swab  Result Value Ref Range Status   SARS Coronavirus 2 by RT PCR NEGATIVE NEGATIVE Final    Comment: (NOTE) SARS-CoV-2 target nucleic acids are NOT DETECTED.  The SARS-CoV-2 RNA is generally detectable in upper respiratory specimens during the acute phase of infection. The lowest concentration of SARS-CoV-2 viral copies this assay can detect is 138 copies/mL. A negative result does not preclude SARS-Cov-2 infection and should not be used as the sole basis for treatment or other patient management decisions. A negative result may occur with  improper specimen collection/handling, submission of specimen other than nasopharyngeal swab, presence of viral mutation(s) within the areas targeted by this assay, and inadequate number of viral copies(<138 copies/mL). A negative result must be combined with clinical observations, patient history, and epidemiological information. The expected result is Negative.  Fact Sheet for Patients:  BloggerCourse.com  Fact Sheet for Healthcare Providers:  SeriousBroker.it  This test is no t yet approved or cleared by the [place] FDA and  has been authorized for detection and/or diagnosis of SARS-CoV-2 by FDA under an Emergency Use Authorization (EUA). This EUA will remain  in effect (meaning this test can be used) for the duration of the COVID-19 declaration under Section 564(b)(1) of the Act, 21 U.S.C.section 360bbb-3(b)(1), unless the authorization is terminated  or revoked sooner.       Influenza A by PCR NEGATIVE NEGATIVE Final   Influenza B by PCR NEGATIVE NEGATIVE Final    Comment: (NOTE) The Xpert Xpress SARS-CoV-2/FLU/RSV plus assay is intended as an aid in the diagnosis of influenza from Nasopharyngeal swab specimens and should not be used as a sole basis for treatment. Nasal washings and aspirates are unacceptable for Xpert Xpress  SARS-CoV-2/FLU/RSV testing.  Fact Sheet for Patients: BloggerCourse.com  Fact Sheet for Healthcare Providers: SeriousBroker.it  This test is not yet approved or cleared by  the [employer] and has been authorized for detection and/or diagnosis of SARS-CoV-2 by FDA under an Emergency Use Authorization (EUA). This EUA will remain in effect (meaning this test can be used) for the duration of the COVID-19 declaration under Section 564(b)(1) of the Act, 21 U.S.C. section 360bbb-3(b)(1), unless the authorization is terminated or revoked.  Performed at [facility], [address], [place], [place] [address]   Blood Culture (routine x 2)     Status: None (Preliminary result)   Collection Time: 07/27/21  2:06 AM   Specimen: BLOOD  Result Value Ref Range Status   Specimen Description BLOOD RIGHT ANTECUBITAL  Final   Special Requests   Final    BOTTLES DRAWN AEROBIC AND ANAEROBIC Blood Culture adequate volume   Culture   Final    NO GROWTH 3 DAYS Performed at [facility], [address] [place], [place] [address]    Report Status PENDING  Incomplete  Blood Culture (routine x 2)     Status: None (Preliminary result)   Collection Time: 07/27/21  2:18 AM   Specimen: BLOOD LEFT FOREARM  Result Value Ref Range Status   Specimen Description BLOOD LEFT FOREARM  Final   Special Requests   Final    BOTTLES DRAWN AEROBIC AND ANAEROBIC Blood Culture adequate volume   Culture   Final    NO GROWTH 3 DAYS Performed at [facility], [address], [place], [place] [address]    Report Status PENDING  Incomplete  Urine Culture     Status: Abnormal   Collection Time: 07/27/21  3:58 AM   Specimen: In/Out Cath Urine  Result Value Ref Range Status   Specimen Description IN/OUT CATH URINE  Final   Special Requests   Final    NONE Performed at [facility], [address], [place], [place] 06/26/1975    Culture  MULTIPLE SPECIES PRESENT, SUGGEST RECOLLECTION (A)  Final   Report Status 07/28/2021 FINAL  Final    Please note: You were cared for by a hospitalist during your hospital stay. Once you are discharged, your primary care physician will handle any further medical issues. Please note that NO REFILLS for any discharge medications will be authorized once you are discharged, as it is imperative that you return to your primary care physician (or establish a relationship with a primary care physician if you do not have one) for your post hospital discharge needs so that they can reassess your need for medications and monitor your lab values.    Time coordinating discharge: 40 minutes  SIGNED:   Burnadette Pop, MD  [facility] 07/31/2021, 10:08 AM Pager [phone]  If 7PM-7AM, please contact night-coverage [email or web address] Password [email or web address]

## 2021-07-31 NOTE — TOC Transition Note (Signed)
Transition of Care [facility]) - CM/SW Discharge Note   Patient Details  Name: Tina Huber MRN: [id number] Date of Birth: 08/09/1975  [facility] CM/SW Contact:  Tom-Johnson, Hershal Coria, RN Phone Number: 07/31/2021, 10:30 AM   Clinical Narrative:     Patient is scheduled for discharge today. No TOC needs or recommendations noted. Denies any needs. Family to transport at discharge. No further TOC needs noted.    Final next level of care: Home/Self Care Barriers to Discharge: Barriers Resolved   Patient Goals and CMS Choice Patient states their goals for this hospitalization and ongoing recovery are:: To return home CMS Medicare.gov Compare Post Acute Care list provided to:: Patient Choice offered to / list presented to : NA  Discharge Placement                Patient to be transferred to facility by: Family      Discharge Plan and Services                DME Arranged: N/A DME Agency: NA       HH Arranged: NA HH Agency: NA        Social Determinants of Health (SDOH) Interventions     Readmission Risk Interventions     No data to display

## 2021-07-31 NOTE — Progress Notes (Signed)
Patient discharging home with assist from family. IV removed without complications. Tele removed and CCMD notified. Discharge instructions given and medication administration discussed. All questions answered.  Tina Huber

## 2021-08-01 LAB — CULTURE, BLOOD (ROUTINE X 2)
Culture: NO GROWTH
Culture: NO GROWTH
Special Requests: ADEQUATE
Special Requests: ADEQUATE

## 2021-08-02 ENCOUNTER — Ambulatory Visit: Payer: Managed Care, Other (non HMO) | Admitting: Nurse Practitioner

## 2021-08-03 ENCOUNTER — Telehealth: Payer: Self-pay

## 2021-08-03 NOTE — Telephone Encounter (Signed)
Transition Care Management Unsuccessful Follow-up Telephone Call  Date of discharge and from where:  [facility] 07/27/21-07/31/21  Attempts:  1st Attempt  Reason for unsuccessful TCM follow-up call:  Unable to leave message

## 2021-08-07 ENCOUNTER — Encounter
Payer: Managed Care, Other (non HMO) | Attending: Physical Medicine & Rehabilitation | Admitting: Physical Medicine & Rehabilitation

## 2021-08-07 ENCOUNTER — Encounter: Payer: Self-pay | Admitting: Physical Medicine & Rehabilitation

## 2021-08-07 VITALS — BP 114/82 | HR 87 | Ht 59.0 in | Wt 171.0 lb

## 2021-08-07 DIAGNOSIS — G894 Chronic pain syndrome: Secondary | ICD-10-CM | POA: Insufficient documentation

## 2021-08-07 DIAGNOSIS — Z5181 Encounter for therapeutic drug level monitoring: Secondary | ICD-10-CM | POA: Diagnosis not present

## 2021-08-07 DIAGNOSIS — Z79891 Long term (current) use of opiate analgesic: Secondary | ICD-10-CM | POA: Diagnosis not present

## 2021-08-07 DIAGNOSIS — G8929 Other chronic pain: Secondary | ICD-10-CM | POA: Diagnosis present

## 2021-08-07 DIAGNOSIS — M5441 Lumbago with sciatica, right side: Secondary | ICD-10-CM | POA: Diagnosis not present

## 2021-08-07 DIAGNOSIS — D573 Sickle-cell trait: Secondary | ICD-10-CM | POA: Diagnosis present

## 2021-08-07 DIAGNOSIS — M069 Rheumatoid arthritis, unspecified: Secondary | ICD-10-CM | POA: Insufficient documentation

## 2021-08-07 DIAGNOSIS — M47819 Spondylosis without myelopathy or radiculopathy, site unspecified: Secondary | ICD-10-CM | POA: Insufficient documentation
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
## 2021-08-10 ENCOUNTER — Other Ambulatory Visit: Payer: Self-pay | Admitting: *Deleted

## 2021-08-10 ENCOUNTER — Other Ambulatory Visit: Payer: Self-pay | Admitting: Family Medicine

## 2021-08-10 DIAGNOSIS — G8929 Other chronic pain: Secondary | ICD-10-CM

## 2021-08-10 LAB — TOXASSURE SELECT,+ANTIDEPR,UR

## 2021-08-10 MED ORDER — CYCLOBENZAPRINE HCL 10 MG PO TABS: 10.0000 mg | ORAL_TABLET | Freq: Every day | ORAL | 1 refills | Status: DC

## 2021-08-10 NOTE — Progress Notes (Signed)
Meds ordered this encounter  Medications   cyclobenzaprine (FLEXERIL) 10 MG tablet    Sig: Take 1 tablet (10 mg total) by mouth at bedtime.    Dispense:  30 tablet    Refill:  1    Order Specific Question:   Supervising Provider    Answer:   Quentin Angst [[id number]]   Nolon Nations  APRN, MSN, FNP-C Patient Care [facility] [address] [place], [place] [address] [phone]

## 2021-08-10 NOTE — Patient Outreach (Signed)
  Care Coordination [facility] Note Transition Care Management Unsuccessful Follow-up Telephone Call  Date of discharge and from where:  [phone]  Attempts:  2nd Attempt  Reason for unsuccessful TCM follow-up call:  Left voice message  Gean Maidens BSN RN [facility] [phone]

## 2021-08-14 ENCOUNTER — Telehealth: Payer: Self-pay | Admitting: *Deleted

## 2021-08-14 NOTE — Telephone Encounter (Signed)
Urine drug screen for this encounter is consistent for prescribed medication 

## 2021-08-16 ENCOUNTER — Ambulatory Visit: Payer: Self-pay | Admitting: *Deleted

## 2021-08-16 NOTE — Patient Outreach (Signed)
  Care Coordination TOC Note Transition Care Management Unsuccessful Follow-up Telephone Call  Date of discharge and from where:  07/31/21 from [facility]  Attempts:  3rd Attempt  Reason for unsuccessful TCM follow-up call:  Left voice message; PATIENT CALLED RIGHT BACK BUT STATED SHE WAS UNABLE TO TALK AT THIS TIME.  UNABLE TO COMPLETE TOC  Rhae Lerner RN, MSN RN Care Management Coordinator  [facility] [phone] [email or web address]

## 2021-08-17 ENCOUNTER — Telehealth (HOSPITAL_COMMUNITY): Payer: Self-pay | Admitting: Physical Medicine & Rehabilitation

## 2021-08-24 ENCOUNTER — Encounter: Payer: Self-pay | Admitting: Nurse Practitioner

## 2021-08-24 ENCOUNTER — Ambulatory Visit: Payer: Managed Care, Other (non HMO) | Admitting: Nurse Practitioner

## 2021-08-24 VITALS — BP 112/87 | HR 90 | Temp 97.6°F | Wt 170.4 lb

## 2021-08-24 DIAGNOSIS — L02412 Cutaneous abscess of left axilla: Secondary | ICD-10-CM | POA: Diagnosis not present

## 2021-08-24 MED ORDER — TRAMADOL HCL 50 MG PO TABS: 50.0000 mg | ORAL_TABLET | Freq: Four times a day (QID) | ORAL | 0 refills | Status: DC | PRN

## 2021-08-24 MED ORDER — PREDNISONE 20 MG PO TABS: 20.0000 mg | ORAL_TABLET | Freq: Every day | ORAL | 0 refills | Status: AC

## 2021-08-24 MED ORDER — CLINDAMYCIN PHOSPHATE 1 % EX GEL: Freq: Two times a day (BID) | CUTANEOUS | 0 refills | Status: DC

## 2021-08-24 MED ORDER — CEPHALEXIN 500 MG PO CAPS: 500.0000 mg | ORAL_CAPSULE | Freq: Three times a day (TID) | ORAL | 0 refills | Status: AC

## 2021-08-24 MED ORDER — TRAMADOL HCL 50 MG PO TABS: 50.0000 mg | ORAL_TABLET | Freq: Two times a day (BID) | ORAL | 0 refills | Status: DC | PRN

## 2021-08-24 NOTE — Telephone Encounter (Signed)
Patient came into the office stating the results of drug screen is in and she is asking about medication sent in. [facility].

## 2021-08-24 NOTE — Assessment & Plan Note (Signed)
-   cephALEXin (KEFLEX) 500 MG capsule; Take 1 capsule (500 mg total) by mouth 3 (three) times daily for 10 days.  Dispense: 30 capsule; Refill: 0 - clindamycin (CLINDAGEL) 1 % gel; Apply topically 2 (two) times daily.  Dispense: 30 g; Refill: 0 - predniSONE (DELTASONE) 20 MG tablet; Take 1 tablet (20 mg total) by mouth daily with breakfast for 5 days.  Dispense: 5 tablet; Refill: 0   Follow up:  Follow up in 3 months

## 2021-08-24 NOTE — Patient Instructions (Signed)
1. Cutaneous abscess of left axilla  - cephALEXin (KEFLEX) 500 MG capsule; Take 1 capsule (500 mg total) by mouth 3 (three) times daily for 10 days.  Dispense: 30 capsule; Refill: 0 - clindamycin (CLINDAGEL) 1 % gel; Apply topically 2 (two) times daily.  Dispense: 30 g; Refill: 0 - predniSONE (DELTASONE) 20 MG tablet; Take 1 tablet (20 mg total) by mouth daily with breakfast for 5 days.  Dispense: 5 tablet; Refill: 0   Follow up:  Follow up in 3 months
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
## 2021-09-12 LAB — HM PAP SMEAR

## 2021-09-18 ENCOUNTER — Encounter: Payer: Managed Care, Other (non HMO) | Attending: Nurse Practitioner | Admitting: Registered"

## 2021-09-18 DIAGNOSIS — E669 Obesity, unspecified: Secondary | ICD-10-CM | POA: Diagnosis present

## 2021-09-18 NOTE — Patient Instructions (Addendum)
Here are some food resources  Food Finder - A Simple Gesture [place] (asimplegesturegso.org)  Little Green Book and Little [employer] were created in 2015. They may have been updated in 2018, but if you use these resources you may want to call ahead to make sure the information is still correct. Pantries: little blue book.. do a search and look for wordpress link. The website is blocked on [employer] because it is a blog site. Meals: the-little-green-book-04-30-2015.pdf (asimplegesturegso.org)  Mobile: Out of the [employer] - Eliminate Childhood Hunger & Food Insecurity Out of the [employer] updates their calendar monthly with times and locations  For next visit bring a food diary including amounts and time you ate Include your daily stress on a scale of 1 - 10.  Pay attention to things that help you have more energy and make you fee better including what foods, what people, and what activities.
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
## 2021-09-19 ENCOUNTER — Other Ambulatory Visit: Payer: Managed Care, Other (non HMO)

## 2021-09-21 ENCOUNTER — Encounter: Payer: Self-pay | Admitting: Physical Medicine & Rehabilitation

## 2021-09-21 ENCOUNTER — Encounter
Payer: Managed Care, Other (non HMO) | Attending: Physical Medicine & Rehabilitation | Admitting: Physical Medicine & Rehabilitation

## 2021-09-21 VITALS — BP 113/79 | HR 99 | Ht 59.0 in | Wt 156.0 lb

## 2021-09-21 DIAGNOSIS — D573 Sickle-cell trait: Secondary | ICD-10-CM | POA: Diagnosis present

## 2021-09-21 DIAGNOSIS — M25512 Pain in left shoulder: Secondary | ICD-10-CM | POA: Insufficient documentation

## 2021-09-21 DIAGNOSIS — M5441 Lumbago with sciatica, right side: Secondary | ICD-10-CM | POA: Insufficient documentation

## 2021-09-21 DIAGNOSIS — G8929 Other chronic pain: Secondary | ICD-10-CM | POA: Insufficient documentation

## 2021-09-21 DIAGNOSIS — M069 Rheumatoid arthritis, unspecified: Secondary | ICD-10-CM | POA: Insufficient documentation

## 2021-09-21 MED ORDER — TRAMADOL HCL 50 MG PO TABS
50.0000 mg | ORAL_TABLET | Freq: Three times a day (TID) | ORAL | 0 refills | Status: DC | PRN
Start: 2021-09-23 — End: 2021-10-20
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
## 2021-10-06 DIAGNOSIS — M1991 Primary osteoarthritis, unspecified site: Secondary | ICD-10-CM | POA: Insufficient documentation

## 2021-10-06 DIAGNOSIS — Z1231 Encounter for screening mammogram for malignant neoplasm of breast: Secondary | ICD-10-CM | POA: Diagnosis not present

## 2021-10-11 ENCOUNTER — Other Ambulatory Visit: Payer: BC Managed Care – PPO

## 2021-10-11 ENCOUNTER — Other Ambulatory Visit: Payer: Self-pay

## 2021-10-11 ENCOUNTER — Encounter: Payer: Managed Care, Other (non HMO) | Admitting: Internal Medicine

## 2021-10-11 DIAGNOSIS — Z21 Asymptomatic human immunodeficiency virus [HIV] infection status: Secondary | ICD-10-CM

## 2021-10-12 LAB — T-HELPER CELLS (CD4) COUNT (NOT AT ARMC)
CD4 % Helper T Cell: 36 % (ref 33–65)
CD4 T Cell Abs: 782 /uL (ref 400–1790)

## 2021-10-17 LAB — HIV-1 RNA QUANT-NO REFLEX-BLD

## 2021-10-20 ENCOUNTER — Other Ambulatory Visit: Payer: Self-pay | Admitting: Physical Medicine & Rehabilitation

## 2021-10-23 ENCOUNTER — Telehealth: Payer: Self-pay

## 2021-10-23 NOTE — Telephone Encounter (Signed)
PA submitted to plan

## 2021-10-23 NOTE — Telephone Encounter (Addendum)
Approved 10/23/21-11/21/21

## 2021-10-26 ENCOUNTER — Ambulatory Visit: Payer: BC Managed Care – PPO | Admitting: Internal Medicine

## 2021-10-26 ENCOUNTER — Other Ambulatory Visit: Payer: Self-pay

## 2021-10-26 ENCOUNTER — Encounter: Payer: Self-pay | Admitting: Internal Medicine

## 2021-10-26 VITALS — BP 115/84 | HR 86 | Resp 16 | Ht 59.0 in | Wt 161.0 lb

## 2021-10-26 DIAGNOSIS — Z23 Encounter for immunization: Secondary | ICD-10-CM | POA: Diagnosis not present

## 2021-10-26 DIAGNOSIS — Z21 Asymptomatic human immunodeficiency virus [HIV] infection status: Secondary | ICD-10-CM

## 2021-10-26 DIAGNOSIS — Z113 Encounter for screening for infections with a predominantly sexual mode of transmission: Secondary | ICD-10-CM | POA: Diagnosis not present

## 2021-10-26 NOTE — Progress Notes (Signed)
   Subjective:    Patient ID: Tina Huber, female    DOB: Jul 12, 1975, 46 y.o.   MRN: [id number]  HPI Here for follow up of HIV She continues off of ARVs as a long term non-progressor and has had no new issues.  Labs prior to today done but viral load was canceled.  No changes otherwise.     Review of Systems  Constitutional:  Negative for fatigue.  Gastrointestinal:  Negative for diarrhea.  Skin:  Negative for rash.       Objective:   Physical Exam Eyes:     General: No scleral icterus. Pulmonary:     Effort: Pulmonary effort is normal.  Neurological:     Mental Status: She is alert.  Psychiatric:        Mood and Affect: Mood normal.           Assessment & Plan:

## 2021-10-26 NOTE — Assessment & Plan Note (Signed)
Discussed flu shot and given today 

## 2021-10-26 NOTE — Patient Instructions (Signed)
Vacuna antigripal (de virus vivos, intranasal): lo que debe saber Influenza (Flu) Vaccine (Live, Intranasal): What You Need to Know 1. Por qu vacunarse? La vacuna antigripal puede prevenir la gripe. La gripe es una enfermedad contagiosa que se disemina en los [place] cada ao, por lo general, Eusebio Me octubre y Ute. Cualquier persona puede contraer gripe, pero es ms peligrosa para Runner, broadcasting/film/video. Los bebs y los nios pequeos, los [employer] de 65 aos, las Financial planner, as [employer] personas que tienen ciertas enfermedades o cuyo sistema inmunitario est debilitado corren un riesgo mayor de tener complicaciones debido a la gripe. La neumona, la bronquitis, las infecciones de los senos paranasales y las infecciones de odos son ejemplos de complicaciones relacionadas con la gripe. Si tiene una afeccin, por ejemplo, enfermedad cardaca, cncer o diabetes, la gripe puede empeorarla. La gripe puede causar fiebre y escalofros, dolor de garganta, dolores musculares, fatiga, tos, dolor de [place] y secrecin o congestin nasal. Algunas personas pueden tener vmitos y Barnett Hatter, [place] esto es ms frecuente en los nios que en los adultos. En un ao promedio, miles de [employer] [place] debido a la gripe, y muchas ms deben ser hospitalizadas. Cada ao, la vacuna antigripal previene millones de enfermedades y evita visitas al mdico relacionadas con la gripe. 2. Madilyn Fireman antigripal de virus vivos atenuados Los CDC (Centros para el Control y la Prevencin de [place]) recomiendan que todas las personas a Glass blower/designer de los 6 meses de edad se vacunen cada temporada de gripe. Es posible que los nios de 6 meses a 8 aos deban recibir 2 dosis durante la misma temporada de gripe. Todas las dems personas tienen que aplicarse 1 sola dosis cada temporada de gripe. La vacuna antigripal de virus vivos atenuados (llamada "LAIV") es un aerosol nasal que puede aplicarse a las personas que  tienen [place] 2 y 49 aos de edad, excepto a las [place]. La vacuna comienza a surtir Librarian, academic 2 semanas despus de su aplicacin. Hay muchos virus de la gripe, y Estate agent. Cada ao, se elabora una nueva vacuna antigripal para brindar proteccin contra los virus gripales que se cree probablemente causen la enfermedad en la siguiente temporada de gripe. Incluso si la vacuna no es especfica para esos virus, aun as puede brindar cierta proteccin. La vacuna antigripal no causa gripe. La vacuna antigripal puede administrarse al mismo tiempo que otras vacunas. 3. Hable con el mdico Comunquese con la persona que le coloca las vacunas si la persona que la recibe: Es Adult nurse de 2 aos de edad o mayor de 49 aos de edad Est [place].La vacuna antigripal de virus vivos atenuados no se recomienda para las personas embarazadas Ha tenido [place] reaccin alrgica despus de Neomia Dear dosis previa de la vacuna antigripal o tiene alguna alergia grave, potencialmente mortal Es un nio o un adolescente de 2 a 17 aos de edad que est tomando aspirina o productos que contienen aspirina o salicilato Tiene el sistema inmunitario debilitado Tiene entre 2 y 4 aos de edad y tiene asma o antecedentes de sibilancias en los ltimos 12 meses Es mayor de 5 aos de edad y tiene asma Tom medicamentos antivirales contra la gripe en las ltimas 3 semanas Cuida a personas gravemente inmunodeprimidas que necesitan un ambiente protegido Tiene otras afecciones subyacentes que pueden poner a las Theatre manager riesgo de sufrir complicaciones graves relacionadas con la gripe (como enfermedad pulmonar, enfermedad cardaca, enfermedad renal como diabetes, trastornos renales o hepticos, trastornos neurolgicos, neuromusculares o  metablicos) No tiene bazo o tiene un bazo que no funciona Tiene un implante coclear Tiene una fuga de lquido cefalorraqudeo (una fuga del lquido que rodea el cerebro por  la nariz, la garganta, el odo o algn otro lugar de la cabeza) Tuvo el sndrome de Curator dentro de las 6 semanas posteriores a una dosis previa de la vacuna antigripal En algunos casos, es posible que el mdico decida posponer la aplicacin de la vacuna antigripal hasta una visita en el futuro. Para algunos pacientes, un tipo diferente de vacuna antigripal (vacuna contra la gripe inactivada o recombinante) podra ser ms apropiada que la vacuna antigripal de virus vivos atenuados. Las personas que sufren trastornos menores, como un resfro, pueden vacunarse. Las personas que tienen enfermedades moderadas o graves generalmente deben esperar hasta recuperarse para poder recibir la vacuna antigripal. Su mdico puede darle ms informacin. 4. Riesgos de [place] reaccin a la vacuna Despus de recibir la vacuna antigripal de virus vivos atenuados, es posible tener secrecin nasal o congestin nasal, sibilancias y Social research officer, government de [place]. Los vmitos, dolores musculares, [place], dolor de [place] y tos son otros posibles efectos secundarios. Si estos problemas ocurren, generalmente comienzan poco despus de la vacunacin y son leves y [place]. Al igual que con cualquier [employer], existe una probabilidad muy remota de que una vacuna cause una reaccin alrgica grave, otra lesin grave o la muerte. 5. Qu pasa si se presenta un problema grave? Podra producirse una reaccin alrgica despus de que la persona vacunada abandone la clnica. Si observa signos de Nurse, mental health grave (ronchas, hinchazn de la cara y la garganta, dificultad para respirar, latidos cardacos acelerados, mareos o debilidad), llame al 9-1-1 y lleve a la persona al hospital ms cercano. Si se presentan otros signos que le preocupan, comunquese con su mdico. Las reacciones adversas deben informarse al Sistema de Informe de Eventos Adversos de Clinical biochemist (VAERS). Por lo general, el mdico presenta este informe o puede hacerlo usted  mismo. Visite el sitio web del VAERS en www.vaers.SamedayNews.es o llame al ([phone]. El VAERS es solo para Electrical engineer, y los miembros de su personal no proporcionan asesoramiento mdico. 6. [place] de Compensacin de Daos por [place] de Compensacin de Daos por Clinical biochemist (National Vaccine Injury Fiserv, Runner, broadcasting/film/video) es un programa federal que fue creado para Patent examiner a las personas que puedan haber sufrido daos al recibir ciertas vacunas. Las Artist a presuntas lesiones o muerte debidas a la vacunacin tienen un lmite de tiempo para su presentacin, que puede ser de tan solo [employer]. Visite el sitio web del VICP en GoldCloset.[email or web address].ee o llame al [phone])[phone] para obtener ms informacin acerca del programa y de cmo presentar un reclamo. 7. Cmo puedo obtener ms informacin? Pregntele a su mdico. Comunquese con el servicio de salud de su localidad o su estado. Visite el sitio web de Environmental manager) (Administracin de Alimentos y Chief Strategy Officer) para ver los prospectos de las vacunas e informacin adicional en TraderRating.uy. Comunquese con Garment/textile technologist for [employer]) (Centros para el Control y la Prevencin de [place]): Llame al [phone] (1-800-CDC-INFO) o Visite el sitio web de los CDC en https://gibson.com/. Fuente: Declaracin de informacin de los CDC sobre la vacuna antigripal de virus vivos atenuados (09/04/2019) Este mismo material est disponible en http://www.wolf.info/ sin cargo. Esta informacin no tiene Marine scientist el consejo del mdico. Asegrese de hacerle al mdico cualquier pregunta que tenga. Document Revised: 01/03/2021 Document Reviewed: 10/06/2020 Elsevier  Patient Education  2023 Elsevier Inc.  

## 2021-10-26 NOTE — Assessment & Plan Note (Signed)
She continues to do well, has remained minimally detected, if at all.  Need to repeat the viral load today.  Otherwise she will rtc in 6 months.

## 2021-10-28 LAB — HIV-1 RNA QUANT-NO REFLEX-BLD
HIV 1 RNA Quant: 26 Copies/mL — ABNORMAL HIGH
HIV-1 RNA Quant, Log: 1.42 Log cps/mL — ABNORMAL HIGH

## 2021-11-02 DIAGNOSIS — M79642 Pain in left hand: Secondary | ICD-10-CM | POA: Diagnosis not present

## 2021-11-02 DIAGNOSIS — M79641 Pain in right hand: Secondary | ICD-10-CM | POA: Diagnosis not present

## 2021-11-02 DIAGNOSIS — R768 Other specified abnormal immunological findings in serum: Secondary | ICD-10-CM | POA: Diagnosis not present

## 2021-11-02 DIAGNOSIS — G894 Chronic pain syndrome: Secondary | ICD-10-CM | POA: Diagnosis not present

## 2021-11-17 ENCOUNTER — Encounter
Payer: BC Managed Care – PPO | Attending: Physical Medicine & Rehabilitation | Admitting: Physical Medicine & Rehabilitation

## 2021-11-17 ENCOUNTER — Encounter: Payer: Self-pay | Admitting: Physical Medicine & Rehabilitation

## 2021-11-17 VITALS — BP 125/82 | HR 107 | Ht 59.0 in | Wt 163.4 lb

## 2021-11-17 DIAGNOSIS — D573 Sickle-cell trait: Secondary | ICD-10-CM | POA: Insufficient documentation

## 2021-11-17 DIAGNOSIS — G8929 Other chronic pain: Secondary | ICD-10-CM | POA: Insufficient documentation

## 2021-11-17 DIAGNOSIS — G894 Chronic pain syndrome: Secondary | ICD-10-CM | POA: Diagnosis not present

## 2021-11-17 DIAGNOSIS — M5441 Lumbago with sciatica, right side: Secondary | ICD-10-CM | POA: Insufficient documentation

## 2021-11-17 DIAGNOSIS — M069 Rheumatoid arthritis, unspecified: Secondary | ICD-10-CM | POA: Diagnosis not present

## 2021-11-17 MED ORDER — TRAMADOL HCL 50 MG PO TABS
50.0000 mg | ORAL_TABLET | Freq: Four times a day (QID) | ORAL | 0 refills | Status: DC | PRN
Start: 2021-11-17 — End: 2022-01-12

## 2021-11-17 MED ORDER — PREGABALIN 50 MG PO CAPS: 50.0000 mg | ORAL_CAPSULE | Freq: Two times a day (BID) | ORAL | 2 refills | Status: DC
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
## 2021-11-20 ENCOUNTER — Encounter: Payer: BC Managed Care – PPO | Attending: Nurse Practitioner | Admitting: Registered"

## 2021-11-20 ENCOUNTER — Encounter: Payer: Self-pay | Admitting: Registered"

## 2021-11-20 VITALS — Wt 161.0 lb

## 2021-11-20 DIAGNOSIS — E669 Obesity, unspecified: Secondary | ICD-10-CM | POA: Insufficient documentation

## 2021-11-20 DIAGNOSIS — Z6832 Body mass index (BMI) 32.0-32.9, adult: Secondary | ICD-10-CM | POA: Insufficient documentation
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
## 2021-11-20 NOTE — Patient Instructions (Addendum)
Continue using nutritional supplements when you can't eat a meal. Consider getting "Complete" instead of "Plus" to get more protein.  Consider having protein with your breakfast, not just fruit.  Continue eating heart healthy fats such as avocados and nuts. Continue minimizing saturated fat (from animal fat)  Continue drinking plenty of water.  Consider increasing your physical activity - 150 active min per week

## 2021-11-23 ENCOUNTER — Ambulatory Visit: Payer: BC Managed Care – PPO | Admitting: Nurse Practitioner

## 2021-11-23 ENCOUNTER — Encounter: Payer: Self-pay | Admitting: Nurse Practitioner

## 2021-11-23 VITALS — BP 136/88 | HR 87 | Temp 98.4°F | Ht 60.0 in | Wt 159.8 lb

## 2021-11-23 DIAGNOSIS — D573 Sickle-cell trait: Secondary | ICD-10-CM

## 2021-11-23 DIAGNOSIS — Z1211 Encounter for screening for malignant neoplasm of colon: Secondary | ICD-10-CM

## 2021-11-23 DIAGNOSIS — E7800 Pure hypercholesterolemia, unspecified: Secondary | ICD-10-CM

## 2021-11-23 LAB — POCT URINALYSIS DIP (PROADVANTAGE DEVICE)
Bilirubin, UA: NEGATIVE
Blood, UA: NEGATIVE
Glucose, UA: NEGATIVE mg/dL
Ketones, POC UA: NEGATIVE mg/dL
Leukocytes, UA: NEGATIVE
Nitrite, UA: NEGATIVE
Protein Ur, POC: NEGATIVE mg/dL
Specific Gravity, Urine: 1.02
Urobilinogen, Ur: 0.2
pH, UA: 7 (ref 5.0–8.0)

## 2021-11-23 MED ORDER — ONDANSETRON HCL 4 MG PO TABS
4.0000 mg | ORAL_TABLET | Freq: Every day | ORAL | 0 refills | Status: DC | PRN
Start: 2021-11-23 — End: 2022-05-24

## 2021-11-23 MED ORDER — ATORVASTATIN CALCIUM 10 MG PO TABS: 10.0000 mg | ORAL_TABLET | Freq: Every day | ORAL | 11 refills | Status: DC

## 2021-11-23 MED ORDER — OMEPRAZOLE 20 MG PO CPDR: 20.0000 mg | DELAYED_RELEASE_CAPSULE | Freq: Every day | ORAL | 3 refills | Status: DC

## 2021-11-23 MED ORDER — METOPROLOL TARTRATE 25 MG PO TABS: 12.5000 mg | ORAL_TABLET | Freq: Two times a day (BID) | ORAL | 1 refills | Status: DC
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
## 2021-11-23 NOTE — Patient Instructions (Signed)
1. Hypercholesteremia  - atorvastatin (LIPITOR) 10 MG tablet; Take 1 tablet (10 mg total) by mouth daily.  Dispense: 30 tablet; Refill: 11 - Lipid Panel - CBC - Comprehensive metabolic panel  2. Colon cancer screening  - Cologuard  Follow up:  Follow up in 6 months

## 2021-11-23 NOTE — Assessment & Plan Note (Signed)
-   atorvastatin (LIPITOR) 10 MG tablet; Take 1 tablet (10 mg total) by mouth daily.  Dispense: 30 tablet; Refill: 11 - Lipid Panel - CBC - Comprehensive metabolic panel  2. Colon cancer screening  - Cologuard  Follow up:  Follow up in 6 months

## 2021-11-24 ENCOUNTER — Other Ambulatory Visit: Payer: Self-pay | Admitting: Nurse Practitioner

## 2021-11-24 DIAGNOSIS — G8929 Other chronic pain: Secondary | ICD-10-CM

## 2021-11-24 LAB — COMPREHENSIVE METABOLIC PANEL
ALT: 13 IU/L (ref 0–32)
AST: 19 IU/L (ref 0–40)
Albumin/Globulin Ratio: 1.6 (ref 1.2–2.2)
Albumin: 4.5 g/dL (ref 3.9–4.9)
Alkaline Phosphatase: 61 IU/L (ref 44–121)
BUN/Creatinine Ratio: 16 (ref 9–23)
BUN: 11 mg/dL (ref 6–24)
Bilirubin Total: 0.3 mg/dL (ref 0.0–1.2)
CO2: 24 mmol/L (ref 20–29)
Calcium: 9.3 mg/dL (ref 8.7–10.2)
Chloride: 97 mmol/L (ref 96–106)
Creatinine, Ser: 0.7 mg/dL (ref 0.57–1.00)
Globulin, Total: 2.9 g/dL (ref 1.5–4.5)
Glucose: 102 mg/dL — ABNORMAL HIGH (ref 70–99)
Potassium: 4.3 mmol/L (ref 3.5–5.2)
Sodium: 135 mmol/L (ref 134–144)
Total Protein: 7.4 g/dL (ref 6.0–8.5)
eGFR: 109 mL/min/{1.73_m2} (ref >59)

## 2021-11-24 LAB — CBC
Hematocrit: 36.7 % (ref 34.0–46.6)
Hemoglobin: 12.3 g/dL (ref 11.1–15.9)
MCH: 29.2 pg (ref 26.6–33.0)
MCHC: 33.5 g/dL (ref 31.5–35.7)
MCV: 87 fL (ref 79–97)
Platelets: 251 10*3/uL (ref 150–450)
RBC: 4.21 x10E6/uL (ref 3.77–5.28)
RDW: 14.7 % (ref 11.7–15.4)
WBC: 5.1 10*3/uL (ref 3.4–10.8)

## 2021-11-24 LAB — LIPID PANEL
Chol/HDL Ratio: 3.6 ratio (ref 0.0–4.4)
Cholesterol, Total: 211 mg/dL — ABNORMAL HIGH (ref 100–199)
HDL: 59 mg/dL (ref >39)
LDL Chol Calc (NIH): 141 mg/dL — ABNORMAL HIGH (ref 0–99)
Triglycerides: 64 mg/dL (ref 0–149)
VLDL Cholesterol Cal: 11 mg/dL (ref 5–40)

## 2021-11-24 MED ORDER — ALBUTEROL SULFATE HFA 108 (90 BASE) MCG/ACT IN AERS: 2.0000 | INHALATION_SPRAY | Freq: Four times a day (QID) | RESPIRATORY_TRACT | 2 refills | Status: DC | PRN

## 2021-11-24 MED ORDER — CYCLOBENZAPRINE HCL 10 MG PO TABS: 10.0000 mg | ORAL_TABLET | Freq: Every day | ORAL | 1 refills | Status: DC

## 2021-12-12 DIAGNOSIS — Z1211 Encounter for screening for malignant neoplasm of colon: Secondary | ICD-10-CM | POA: Diagnosis not present

## 2021-12-25 LAB — COLOGUARD: COLOGUARD: POSITIVE — AB

## 2021-12-27 ENCOUNTER — Other Ambulatory Visit: Payer: Self-pay | Admitting: Nurse Practitioner

## 2021-12-27 DIAGNOSIS — R195 Other fecal abnormalities: Secondary | ICD-10-CM

## 2021-12-28 ENCOUNTER — Other Ambulatory Visit: Payer: Self-pay | Admitting: Nurse Practitioner

## 2021-12-28 DIAGNOSIS — G8929 Other chronic pain: Secondary | ICD-10-CM

## 2021-12-28 MED ORDER — CYCLOBENZAPRINE HCL 10 MG PO TABS: 10.0000 mg | ORAL_TABLET | Freq: Every day | ORAL | 1 refills | Status: DC

## 2022-01-01 ENCOUNTER — Telehealth: Payer: Self-pay | Admitting: Nurse Practitioner

## 2022-01-01 NOTE — Telephone Encounter (Signed)
Pt states her flexeril, albuterol, and [facility] be sent to [facility] on [address] instead

## 2022-01-03 ENCOUNTER — Other Ambulatory Visit: Payer: Self-pay | Admitting: Nurse Practitioner

## 2022-01-03 DIAGNOSIS — M545 Low back pain, unspecified: Secondary | ICD-10-CM

## 2022-01-03 MED ORDER — ALBUTEROL SULFATE HFA 108 (90 BASE) MCG/ACT IN AERS: 2.0000 | INHALATION_SPRAY | Freq: Four times a day (QID) | RESPIRATORY_TRACT | 2 refills | Status: DC | PRN

## 2022-01-03 MED ORDER — CYCLOBENZAPRINE HCL 10 MG PO TABS: 10.0000 mg | ORAL_TABLET | Freq: Every day | ORAL | 1 refills | Status: DC

## 2022-01-03 MED ORDER — DICLOFENAC SODIUM 1 % EX GEL: 2.0000 g | Freq: Two times a day (BID) | CUTANEOUS | 2 refills | Status: AC | PRN

## 2022-01-03 NOTE — Telephone Encounter (Signed)
Prescriptions sent

## 2022-01-08 ENCOUNTER — Encounter: Payer: Self-pay | Admitting: Registered"

## 2022-01-08 ENCOUNTER — Encounter: Payer: BC Managed Care – PPO | Attending: Nurse Practitioner | Admitting: Registered"

## 2022-01-08 DIAGNOSIS — E669 Obesity, unspecified: Secondary | ICD-10-CM | POA: Insufficient documentation

## 2022-01-08 DIAGNOSIS — Z6832 Body mass index (BMI) 32.0-32.9, adult: Secondary | ICD-10-CM | POA: Insufficient documentation

## 2022-01-08 NOTE — Patient Instructions (Addendum)
Continue working on getting in protein. Go macro is a type of meal replacement bar that would be a small amount of food. Continue drinking vegetable drink Also consider having fruit to help keep up your energy Baby carrots, try dipping in humus  Call your doctors office and let them know you have not been contacted by the GI specialist yet (referred after cologuard test)  Continue to get plenty of rest
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
## 2022-01-12 ENCOUNTER — Encounter: Payer: Self-pay | Admitting: Physical Medicine & Rehabilitation

## 2022-01-12 ENCOUNTER — Encounter
Payer: BC Managed Care – PPO | Attending: Physical Medicine & Rehabilitation | Admitting: Physical Medicine & Rehabilitation

## 2022-01-12 VITALS — BP 109/73 | HR 103 | Ht 60.0 in | Wt 158.0 lb

## 2022-01-12 DIAGNOSIS — M797 Fibromyalgia: Secondary | ICD-10-CM

## 2022-01-12 DIAGNOSIS — G8929 Other chronic pain: Secondary | ICD-10-CM

## 2022-01-12 DIAGNOSIS — M5441 Lumbago with sciatica, right side: Secondary | ICD-10-CM | POA: Insufficient documentation

## 2022-01-12 DIAGNOSIS — M069 Rheumatoid arthritis, unspecified: Secondary | ICD-10-CM | POA: Diagnosis not present

## 2022-01-12 MED ORDER — PREGABALIN 75 MG PO CAPS
75.0000 mg | ORAL_CAPSULE | Freq: Two times a day (BID) | ORAL | 3 refills | Status: DC
Start: 2022-01-12 — End: 2022-06-29

## 2022-01-12 MED ORDER — TRAMADOL HCL 50 MG PO TABS: 50.0000 mg | ORAL_TABLET | Freq: Four times a day (QID) | ORAL | 0 refills | Status: DC | PRN
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
## 2022-01-17 ENCOUNTER — Other Ambulatory Visit: Payer: Self-pay | Admitting: Nurse Practitioner

## 2022-01-31 ENCOUNTER — Other Ambulatory Visit: Payer: Self-pay

## 2022-01-31 MED ORDER — METOPROLOL TARTRATE 25 MG PO TABS: 12.5000 mg | ORAL_TABLET | Freq: Two times a day (BID) | ORAL | 1 refills | Status: DC

## 2022-02-02 DIAGNOSIS — M069 Rheumatoid arthritis, unspecified: Secondary | ICD-10-CM | POA: Diagnosis not present

## 2022-02-02 DIAGNOSIS — M797 Fibromyalgia: Secondary | ICD-10-CM | POA: Diagnosis not present

## 2022-02-02 DIAGNOSIS — M5441 Lumbago with sciatica, right side: Secondary | ICD-10-CM | POA: Diagnosis not present

## 2022-02-03 DIAGNOSIS — M069 Rheumatoid arthritis, unspecified: Secondary | ICD-10-CM | POA: Diagnosis not present

## 2022-02-03 DIAGNOSIS — M5441 Lumbago with sciatica, right side: Secondary | ICD-10-CM | POA: Diagnosis not present

## 2022-02-03 DIAGNOSIS — M797 Fibromyalgia: Secondary | ICD-10-CM | POA: Diagnosis not present

## 2022-02-04 DIAGNOSIS — M069 Rheumatoid arthritis, unspecified: Secondary | ICD-10-CM | POA: Diagnosis not present

## 2022-02-04 DIAGNOSIS — M5441 Lumbago with sciatica, right side: Secondary | ICD-10-CM | POA: Diagnosis not present

## 2022-02-04 DIAGNOSIS — M797 Fibromyalgia: Secondary | ICD-10-CM | POA: Diagnosis not present

## 2022-02-09 ENCOUNTER — Telehealth: Payer: Self-pay | Admitting: Nurse Practitioner

## 2022-02-09 ENCOUNTER — Encounter: Payer: Self-pay | Admitting: Nurse Practitioner

## 2022-02-09 NOTE — Telephone Encounter (Signed)
REQUESING SICK NOTE FOR WORK SO SHE CAN RETURN [phone]

## 2022-02-09 NOTE — Telephone Encounter (Signed)
Letter was sent to your my chart
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
## 2022-03-02 ENCOUNTER — Ambulatory Visit: Payer: BC Managed Care – PPO | Admitting: Gastroenterology

## 2022-03-02 ENCOUNTER — Encounter: Payer: Self-pay | Admitting: Gastroenterology

## 2022-03-02 ENCOUNTER — Other Ambulatory Visit (INDEPENDENT_AMBULATORY_CARE_PROVIDER_SITE_OTHER): Payer: BC Managed Care – PPO

## 2022-03-02 VITALS — BP 120/80 | HR 100 | Ht 60.0 in | Wt 157.0 lb

## 2022-03-02 DIAGNOSIS — R110 Nausea: Secondary | ICD-10-CM | POA: Diagnosis not present

## 2022-03-02 DIAGNOSIS — R195 Other fecal abnormalities: Secondary | ICD-10-CM

## 2022-03-02 DIAGNOSIS — R109 Unspecified abdominal pain: Secondary | ICD-10-CM | POA: Diagnosis not present

## 2022-03-02 DIAGNOSIS — R634 Abnormal weight loss: Secondary | ICD-10-CM

## 2022-03-02 LAB — COMPREHENSIVE METABOLIC PANEL
ALT: 17 U/L (ref 0–35)
AST: 18 U/L (ref 0–37)
Albumin: 4.4 g/dL (ref 3.5–5.2)
Alkaline Phosphatase: 71 U/L (ref 39–117)
BUN: 9 mg/dL (ref 6–23)
CO2: 29 mEq/L (ref 19–32)
Calcium: 9.3 mg/dL (ref 8.4–10.5)
Chloride: 100 mEq/L (ref 96–112)
Creatinine, Ser: 0.63 mg/dL (ref 0.40–1.20)
GFR: 107.16 mL/min (ref >60.00)
Glucose, Bld: 110 mg/dL — ABNORMAL HIGH (ref 70–99)
Potassium: 3.5 mEq/L (ref 3.5–5.1)
Sodium: 136 mEq/L (ref 135–145)
Total Bilirubin: 0.4 mg/dL (ref 0.2–1.2)
Total Protein: 8.4 g/dL — ABNORMAL HIGH (ref 6.0–8.3)

## 2022-03-02 LAB — TSH: TSH: 0.55 u[IU]/mL (ref 0.35–5.50)

## 2022-03-02 LAB — CBC WITH DIFFERENTIAL/PLATELET
Basophils Absolute: 0 10*3/uL (ref 0.0–0.1)
Basophils Relative: 0.2 % (ref 0.0–3.0)
Eosinophils Absolute: 0.1 10*3/uL (ref 0.0–0.7)
Eosinophils Relative: 1.4 % (ref 0.0–5.0)
HCT: 39.3 % (ref 36.0–46.0)
Hemoglobin: 13.2 g/dL (ref 12.0–15.0)
Lymphocytes Relative: 49.1 % — ABNORMAL HIGH (ref 12.0–46.0)
Lymphs Abs: 2.4 10*3/uL (ref 0.7–4.0)
MCHC: 33.7 g/dL (ref 30.0–36.0)
MCV: 87 fl (ref 78.0–100.0)
Monocytes Absolute: 0.4 10*3/uL (ref 0.1–1.0)
Monocytes Relative: 8 % (ref 3.0–12.0)
Neutro Abs: 2 10*3/uL (ref 1.4–7.7)
Neutrophils Relative %: 41.3 % — ABNORMAL LOW (ref 43.0–77.0)
Platelets: 268 10*3/uL (ref 150.0–400.0)
RBC: 4.52 Mil/uL (ref 3.87–5.11)
RDW: 14.8 % (ref 11.5–15.5)
WBC: 4.9 10*3/uL (ref 4.0–10.5)

## 2022-03-02 LAB — LIPASE: Lipase: 24 U/L (ref 11.0–59.0)

## 2022-03-02 MED ORDER — NA SULFATE-K SULFATE-MG SULF 17.5-3.13-1.6 GM/177ML PO SOLN: 1.0000 | Freq: Once | ORAL | 0 refills | Status: AC

## 2022-03-02 MED ORDER — DICYCLOMINE HCL 10 MG PO CAPS: 10.0000 mg | ORAL_CAPSULE | Freq: Three times a day (TID) | ORAL | 0 refills | Status: DC

## 2022-03-02 NOTE — Patient Instructions (Addendum)
Your provider has requested that you go to the basement level for lab work before leaving today. Press "B" on the elevator. The lab is located at the first door on the left as you exit the elevator.  We have sent the following medications to your pharmacy for you to pick up at your convenience: Dicyclomine 10 mg four times daily as needed for abdominal pain.  You have been scheduled for a colonoscopy. Please follow written instructions given to you at your visit today.  Please pick up your prep supplies at the pharmacy within the next 1-3 days. If you use inhalers (even only as needed), please bring them with you on the day of your procedure.  _______________________________________________________  If your blood pressure at your visit was 140/90 or greater, please contact your primary care physician to follow up on this.  _______________________________________________________  If you are age 46 or older, your body mass index should be between 23-30. Your Body mass index is 30.66 kg/m. If this is out of the aforementioned range listed, please consider follow up with your Primary Care Provider.  If you are age 37 or younger, your body mass index should be between 19-25. Your Body mass index is 30.66 kg/m. If this is out of the aformentioned range listed, please consider follow up with your Primary Care Provider.   ________________________________________________________  The [facility] providers would like to encourage you to use [facility] to communicate with providers for non-urgent requests or questions.  Due to long hold times on the telephone, sending your provider a message by [facility] may be a faster and more efficient way to get a response.  Please allow 48 business hours for a response.  Please remember that this is for non-urgent requests.  _______________________________________________________

## 2022-03-05 DIAGNOSIS — M069 Rheumatoid arthritis, unspecified: Secondary | ICD-10-CM | POA: Diagnosis not present

## 2022-03-05 DIAGNOSIS — M797 Fibromyalgia: Secondary | ICD-10-CM | POA: Diagnosis not present

## 2022-03-05 DIAGNOSIS — M5441 Lumbago with sciatica, right side: Secondary | ICD-10-CM | POA: Diagnosis not present

## 2022-03-06 ENCOUNTER — Encounter: Payer: Self-pay | Admitting: Gastroenterology

## 2022-03-06 ENCOUNTER — Ambulatory Visit (AMBULATORY_SURGERY_CENTER): Payer: BC Managed Care – PPO | Admitting: Gastroenterology

## 2022-03-06 VITALS — BP 135/99 | HR 94 | Temp 98.2°F | Resp 21 | Ht 60.0 in | Wt 157.0 lb

## 2022-03-06 DIAGNOSIS — Z1211 Encounter for screening for malignant neoplasm of colon: Secondary | ICD-10-CM

## 2022-03-06 DIAGNOSIS — M797 Fibromyalgia: Secondary | ICD-10-CM | POA: Diagnosis not present

## 2022-03-06 DIAGNOSIS — M5441 Lumbago with sciatica, right side: Secondary | ICD-10-CM | POA: Diagnosis not present

## 2022-03-06 DIAGNOSIS — K2950 Unspecified chronic gastritis without bleeding: Secondary | ICD-10-CM | POA: Diagnosis not present

## 2022-03-06 DIAGNOSIS — B9681 Helicobacter pylori [H. pylori] as the cause of diseases classified elsewhere: Secondary | ICD-10-CM | POA: Diagnosis not present

## 2022-03-06 DIAGNOSIS — M069 Rheumatoid arthritis, unspecified: Secondary | ICD-10-CM | POA: Diagnosis not present

## 2022-03-06 DIAGNOSIS — K2970 Gastritis, unspecified, without bleeding: Secondary | ICD-10-CM | POA: Diagnosis not present

## 2022-03-06 DIAGNOSIS — R110 Nausea: Secondary | ICD-10-CM

## 2022-03-06 DIAGNOSIS — R195 Other fecal abnormalities: Secondary | ICD-10-CM

## 2022-03-06 MED ORDER — SODIUM CHLORIDE 0.9 % IV SOLN: 500.0000 mL | Freq: Once | INTRAVENOUS | Status: DC

## 2022-03-06 NOTE — Op Note (Signed)
[place] Patient Name: Tina Huber Procedure Date: 03/06/2022 7:26 AM MRN: [id number] Endoscopist: Thornton Park MD, MD, [id number] Age: 47 Referring MD:  Date of Birth: 12-Jun-1975 Gender: Female Account #: [id number] Procedure:                Colonoscopy Indications:              Positive Cologuard test Medicines:                Monitored Anesthesia Care Procedure:                Pre-Anesthesia Assessment:                           - Prior to the procedure, a History and Physical                            was performed, and patient medications and                            allergies were reviewed. The patient's tolerance of                            previous anesthesia was also reviewed. The risks                            and benefits of the procedure and the sedation                            options and risks were discussed with the patient.                            All questions were answered, and informed consent                            was obtained. Prior Anticoagulants: The patient has                            taken no anticoagulant or antiplatelet agents. ASA                            Grade Assessment: II - A patient with mild systemic                            disease. After reviewing the risks and benefits,                            the patient was deemed in satisfactory condition to                            undergo the procedure.                           After obtaining informed consent, the colonoscope  was passed under direct vision. Throughout the                            procedure, the patient's blood pressure, pulse, and                            oxygen saturations were monitored continuously. The                            CF [id number] #[id number] was introduced through the anus                            and advanced to the the cecum, identified by                            appendiceal orifice and ileocecal  valve. The                            colonoscopy was performed with moderate difficulty                            due to a tortuous colon. Successful completion of                            the procedure was aided by changing the patient's                            position and withdrawing and reinserting the scope.                            The patient tolerated the procedure well. The                            quality of the bowel preparation was excellent. The                            ileocecal valve, appendiceal orifice, and rectum                            were photographed. Scope In: 7:47:36 AM Scope Out: 8:07:40 AM Scope Withdrawal Time: 0 hours 15 minutes 33 seconds  Total Procedure Duration: 0 hours 20 minutes 4 seconds  Findings:                 The perianal and digital rectal examinations were                            normal.                           The right colon was moderately tortuous. Advancing                            the scope required withdrawing and reinserting the  scope.                           The exam was otherwise without abnormality on                            direct and retroflexion views. Complications:            No immediate complications. Estimated Blood Loss:     Estimated blood loss: none. Impression:               - Tortuous colon.                           - The examination was otherwise normal on direct                            and retroflexion views.                           - No specimens collected. Recommendation:           - Patient has a contact number available for                            emergencies. The signs and symptoms of potential                            delayed complications were discussed with the                            patient. Return to normal activities tomorrow.                            Written discharge instructions were provided to the                             patient.                           - Resume previous diet.                           - Continue present medications.                           - Repeat colonoscopy in 10 years for surveillance,                            earlier with new symptoms.                           - Emerging evidence supports eating a diet of                            fruits, vegetables, grains, calcium, and yogurt  while reducing red meat and alcohol may reduce the                            risk of colon cancer. Thornton Park MD, MD 03/06/2022 8:11:44 AM This report has been signed electronically.

## 2022-03-06 NOTE — Progress Notes (Signed)
Indication for upper endoscopy: Nausea, dyspepsia, weight loss Indication for colonoscopy: Positive Cologuard  Please see my 03/02/2022 office note for complete details.  There is been no change in history or physical exam since that time.  She remains an appropriate candidate for monitored anesthesia care in the endoscopy center.

## 2022-03-06 NOTE — Op Note (Signed)
[facility] Patient Name: Tina Huber Procedure Date: 03/06/2022 7:26 AM MRN: [id number] Endoscopist: Thornton Park MD, MD, [id number] Age: 47 Referring MD:  Date of Birth: October 09, 1975 Gender: Female Account #: [id number] Procedure:                Upper GI endoscopy Indications:              Dyspepsia, Nausea, Weight loss Medicines:                Monitored Anesthesia Care Procedure:                Pre-Anesthesia Assessment:                           - Prior to the procedure, a History and Physical                            was performed, and patient medications and                            allergies were reviewed. The patient's tolerance of                            previous anesthesia was also reviewed. The risks                            and benefits of the procedure and the sedation                            options and risks were discussed with the patient.                            All questions were answered, and informed consent                            was obtained. Prior Anticoagulants: The patient has                            taken no anticoagulant or antiplatelet agents. ASA                            Grade Assessment: II - A patient with mild systemic                            disease. After reviewing the risks and benefits,                            the patient was deemed in satisfactory condition to                            undergo the procedure.                           After obtaining informed consent, the endoscope was  passed under direct vision. Throughout the                            procedure, the patient's blood pressure, pulse, and                            oxygen saturations were monitored continuously. The                            Olympus Scope [id number] was introduced through the                            mouth, and advanced to the third part of duodenum.                            The upper GI  endoscopy was accomplished without                            difficulty. The patient tolerated the procedure                            well. Scope In: Scope Out: Findings:                 The examined esophagus was normal. The z-line is                            located 36 cm from the incisors.                           Patchy mildly erythematous mucosa without bleeding                            was found in the gastric body. Biopsies were taken                            from the antrum, body, and fundus with a cold                            forceps for histology. Estimated blood loss was                            minimal.                           The examined duodenum was normal. Biopsies were                            taken with a cold forceps for histology. Estimated                            blood loss was minimal.                           The cardia and gastric fundus were normal on  retroflexion.                           The exam was otherwise without abnormality. Complications:            No immediate complications. Estimated Blood Loss:     Estimated blood loss was minimal. Impression:               - Normal esophagus.                           - Erythematous mucosa in the gastric body. Biopsied.                           - Normal examined duodenum. Biopsied.                           - The examination was otherwise normal. Recommendation:           - Patient has a contact number available for                            emergencies. The signs and symptoms of potential                            delayed complications were discussed with the                            patient. Return to normal activities tomorrow.                            Written discharge instructions were provided to the                            patient.                           - Resume previous diet.                           - Continue present medications.                            - Await pathology results.                           - No aspirin, ibuprofen, naproxen, or other                            non-steroidal anti-inflammatory drugs.                           - Proceed with colonoscopy today as previously                            planned. Thornton Park MD, MD 03/06/2022 7:44:53 AM This report has been signed electronically.

## 2022-03-06 NOTE — Patient Instructions (Signed)
Discharge instructions given. Handout on Gastritis. Resume previous medications. YOU HAD AN ENDOSCOPIC PROCEDURE TODAY AT [facility]:   Refer to the procedure report that was given to you for any specific questions about what was found during the examination.  If the procedure report does not answer your questions, please call your gastroenterologist to clarify.  If you requested that your care partner not be given the details of your procedure findings, then the procedure report has been included in a sealed envelope for you to review at your convenience later.  YOU SHOULD EXPECT: Some feelings of bloating in the abdomen. Passage of more gas than usual.  Walking can help get rid of the air that was put into your GI tract during the procedure and reduce the bloating. If you had a lower endoscopy (such as a colonoscopy or flexible sigmoidoscopy) you may notice spotting of blood in your stool or on the toilet paper. If you underwent a bowel prep for your procedure, you may not have a normal bowel movement for a few days.  Please Note:  You might notice some irritation and congestion in your nose or some drainage.  This is from the oxygen used during your procedure.  There is no need for concern and it should clear up in a day or so.  SYMPTOMS TO REPORT IMMEDIATELY:  Following lower endoscopy (colonoscopy or flexible sigmoidoscopy):  Excessive amounts of blood in the stool  Significant tenderness or worsening of abdominal pains  Swelling of the abdomen that is new, acute  Fever of 100F or higher  Following upper endoscopy (EGD)  Vomiting of blood or coffee ground material  New chest pain or pain under the shoulder blades  Painful or persistently difficult swallowing  New shortness of breath  Fever of 100F or higher  Black, tarry-looking stools  For urgent or emergent issues, a gastroenterologist can be reached at any hour by calling [phone]. Do not use MyChart  messaging for urgent concerns.    DIET:  We do recommend a small meal at first, but then you may proceed to your regular diet.  Drink plenty of fluids but you should avoid alcoholic beverages for 24 hours.  ACTIVITY:  You should plan to take it easy for the rest of today and you should NOT DRIVE or use heavy machinery until tomorrow (because of the sedation medicines used during the test).    FOLLOW UP: Our staff will call the number listed on your records the next business day following your procedure.  We will call around 7:15- 8:00 am to check on you and address any questions or concerns that you may have regarding the information given to you following your procedure. If we do not reach you, we will leave a message.     If any biopsies were taken you will be contacted by phone or by letter within the next 1-3 weeks.  Please call us at [phone] if you have not heard about the biopsies in 3 weeks.    SIGNATURES/CONFIDENTIALITY: You and/or your care partner have signed paperwork which will be entered into your electronic medical record.  These signatures attest to the fact that that the information above on your After Visit Summary has been reviewed and is understood.  Full responsibility of the confidentiality of this discharge information lies with you and/or your care-partner.

## 2022-03-06 NOTE — Progress Notes (Signed)
A and O x3. Report to RN. Tolerated MAC anesthesia well.Teeth unchanged after procedure. 

## 2022-03-06 NOTE — Progress Notes (Signed)
Pt's states no medical or surgical changes since previsit or office visit. 

## 2022-03-06 NOTE — Progress Notes (Signed)
Called to room to assist during endoscopic procedure.  Patient ID and intended procedure confirmed with present staff. Received instructions for my participation in the procedure from the performing physician.  

## 2022-03-07 ENCOUNTER — Telehealth: Payer: Self-pay

## 2022-03-07 NOTE — Telephone Encounter (Signed)
Pt scheduled for f/u on 04/11/22 at 10:30 am. Attempted to call to let her know. Left voicemail for pt to return call. Mychart message sent to patient with appointment information. Pt instructed to contact office if this appt time will not work.

## 2022-03-07 NOTE — Telephone Encounter (Signed)
  Follow up Call-     03/06/2022    7:17 AM  Call back number  Post procedure Call Back phone  # [phone]  Permission to leave phone message Yes     Patient questions:  Do you have a fever, pain , or abdominal swelling? No. Pain Score  0 *  Have you tolerated food without any problems? Yes.    Have you been able to return to your normal activities? Yes.    Do you have any questions about your discharge instructions: Diet   No. Medications  No. Follow up visit  No.  Do you have questions or concerns about your Care? No.  Actions: * If pain score is 4 or above: No action needed, pain <4.

## 2022-03-07 NOTE — Telephone Encounter (Signed)
-----   Message from Thornton Park, MD sent at 03/06/2022  8:11 AM EST ----- Office follow-up with me - first available but no earlier than 10 days from now so that path results are available at the time of the appointment.  Thank you.  KLB

## 2022-03-08 NOTE — Telephone Encounter (Signed)
Patient returned call and confirmed appointment.

## 2022-03-09 ENCOUNTER — Encounter
Payer: BC Managed Care – PPO | Attending: Physical Medicine & Rehabilitation | Admitting: Physical Medicine & Rehabilitation

## 2022-03-09 DIAGNOSIS — M5441 Lumbago with sciatica, right side: Secondary | ICD-10-CM | POA: Insufficient documentation

## 2022-03-09 DIAGNOSIS — G8929 Other chronic pain: Secondary | ICD-10-CM | POA: Insufficient documentation

## 2022-03-09 DIAGNOSIS — G894 Chronic pain syndrome: Secondary | ICD-10-CM | POA: Insufficient documentation

## 2022-03-09 DIAGNOSIS — M797 Fibromyalgia: Secondary | ICD-10-CM | POA: Insufficient documentation

## 2022-03-09 DIAGNOSIS — D573 Sickle-cell trait: Secondary | ICD-10-CM | POA: Insufficient documentation

## 2022-03-09 DIAGNOSIS — Z79891 Long term (current) use of opiate analgesic: Secondary | ICD-10-CM | POA: Insufficient documentation
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
## 2022-03-13 ENCOUNTER — Other Ambulatory Visit: Payer: Self-pay | Admitting: Physical Medicine & Rehabilitation

## 2022-03-13 MED ORDER — TRAMADOL HCL 50 MG PO TABS: 50.0000 mg | ORAL_TABLET | Freq: Four times a day (QID) | ORAL | 0 refills | Status: DC | PRN

## 2022-03-14 ENCOUNTER — Other Ambulatory Visit: Payer: Self-pay

## 2022-03-14 DIAGNOSIS — B9681 Helicobacter pylori [H. pylori] as the cause of diseases classified elsewhere: Secondary | ICD-10-CM

## 2022-03-14 MED ORDER — TETRACYCLINE HCL 500 MG PO CAPS: 500.0000 mg | ORAL_CAPSULE | Freq: Four times a day (QID) | ORAL | 0 refills | Status: DC

## 2022-03-14 MED ORDER — PANTOPRAZOLE SODIUM 40 MG PO TBEC: 40.0000 mg | DELAYED_RELEASE_TABLET | Freq: Two times a day (BID) | ORAL | 0 refills | Status: DC

## 2022-03-14 MED ORDER — METRONIDAZOLE 500 MG PO TABS: 500.0000 mg | ORAL_TABLET | Freq: Four times a day (QID) | ORAL | 0 refills | Status: DC

## 2022-03-14 MED ORDER — BISMUTH SUBSALICYLATE 262 MG PO CHEW: 524.0000 mg | CHEWABLE_TABLET | Freq: Four times a day (QID) | ORAL | 0 refills | Status: DC

## 2022-03-15 ENCOUNTER — Other Ambulatory Visit: Payer: Self-pay | Admitting: Nurse Practitioner

## 2022-03-19 ENCOUNTER — Encounter: Payer: BC Managed Care – PPO | Attending: Nurse Practitioner | Admitting: Registered"

## 2022-03-19 ENCOUNTER — Encounter: Payer: Self-pay | Admitting: Registered"

## 2022-03-19 VITALS — Wt 165.0 lb

## 2022-03-19 DIAGNOSIS — E669 Obesity, unspecified: Secondary | ICD-10-CM | POA: Diagnosis not present

## 2022-03-19 DIAGNOSIS — Z6832 Body mass index (BMI) 32.0-32.9, adult: Secondary | ICD-10-CM | POA: Insufficient documentation

## 2022-03-19 DIAGNOSIS — E282 Polycystic ovarian syndrome: Secondary | ICD-10-CM | POA: Diagnosis not present
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
## 2022-03-19 NOTE — Patient Instructions (Signed)
You don't need to drink boost now that you are eating more regularly. Continue eating vegetables daily Continue drinking plenty of water  Get into a schedule for meals 7-7:30 am for breakfast 12-1 pm for lunch 6:30 dinner. Sit around table with family to eat 8 pm snack: [place] yogurt or plain with nuts  Work on getting into a better sleep routine: After dinner and by 8:30 exercise & warm shower 8:30 pm no screens 9:00 take medication (with warm milk) In bed by 9:30 pm

## 2022-03-20 ENCOUNTER — Encounter: Payer: BC Managed Care – PPO | Admitting: Physical Medicine & Rehabilitation

## 2022-03-20 ENCOUNTER — Encounter: Payer: Self-pay | Admitting: Physical Medicine & Rehabilitation

## 2022-03-20 VITALS — BP 120/82 | HR 74 | Ht 60.0 in | Wt 160.0 lb

## 2022-03-20 DIAGNOSIS — M797 Fibromyalgia: Secondary | ICD-10-CM | POA: Diagnosis not present

## 2022-03-20 DIAGNOSIS — M5441 Lumbago with sciatica, right side: Secondary | ICD-10-CM

## 2022-03-20 DIAGNOSIS — G8929 Other chronic pain: Secondary | ICD-10-CM

## 2022-03-20 DIAGNOSIS — Z79891 Long term (current) use of opiate analgesic: Secondary | ICD-10-CM

## 2022-03-20 DIAGNOSIS — D573 Sickle-cell trait: Secondary | ICD-10-CM

## 2022-03-20 DIAGNOSIS — G894 Chronic pain syndrome: Secondary | ICD-10-CM
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
## 2022-03-23 ENCOUNTER — Other Ambulatory Visit: Payer: Self-pay | Admitting: Nurse Practitioner

## 2022-04-03 DIAGNOSIS — M5441 Lumbago with sciatica, right side: Secondary | ICD-10-CM | POA: Diagnosis not present

## 2022-04-03 DIAGNOSIS — M797 Fibromyalgia: Secondary | ICD-10-CM | POA: Diagnosis not present

## 2022-04-03 DIAGNOSIS — M069 Rheumatoid arthritis, unspecified: Secondary | ICD-10-CM | POA: Diagnosis not present

## 2022-04-04 DIAGNOSIS — M797 Fibromyalgia: Secondary | ICD-10-CM | POA: Diagnosis not present

## 2022-04-04 DIAGNOSIS — M069 Rheumatoid arthritis, unspecified: Secondary | ICD-10-CM | POA: Diagnosis not present

## 2022-04-04 DIAGNOSIS — M5441 Lumbago with sciatica, right side: Secondary | ICD-10-CM | POA: Diagnosis not present

## 2022-04-11 ENCOUNTER — Other Ambulatory Visit: Payer: Self-pay

## 2022-04-11 ENCOUNTER — Ambulatory Visit: Payer: BC Managed Care – PPO | Admitting: Gastroenterology

## 2022-04-11 ENCOUNTER — Other Ambulatory Visit: Payer: PRIVATE HEALTH INSURANCE

## 2022-04-11 DIAGNOSIS — Z21 Asymptomatic human immunodeficiency virus [HIV] infection status: Secondary | ICD-10-CM

## 2022-04-11 DIAGNOSIS — Z113 Encounter for screening for infections with a predominantly sexual mode of transmission: Secondary | ICD-10-CM

## 2022-04-12 ENCOUNTER — Other Ambulatory Visit: Payer: Self-pay | Admitting: Nurse Practitioner

## 2022-04-12 ENCOUNTER — Other Ambulatory Visit: Payer: Self-pay | Admitting: Physical Medicine & Rehabilitation

## 2022-04-12 LAB — T-HELPER CELL (CD4) - (RCID CLINIC ONLY)
CD4 % Helper T Cell: 26 % — ABNORMAL LOW (ref 33–65)
CD4 T Cell Abs: 292 /uL — ABNORMAL LOW (ref 400–1790)

## 2022-04-13 ENCOUNTER — Encounter: Payer: Self-pay | Admitting: Gastroenterology

## 2022-04-13 ENCOUNTER — Ambulatory Visit: Payer: BC Managed Care – PPO | Admitting: Gastroenterology

## 2022-04-13 VITALS — BP 118/80 | HR 111 | Ht 60.0 in | Wt 159.0 lb

## 2022-04-13 DIAGNOSIS — R432 Parageusia: Secondary | ICD-10-CM

## 2022-04-13 DIAGNOSIS — B9681 Helicobacter pylori [H. pylori] as the cause of diseases classified elsewhere: Secondary | ICD-10-CM

## 2022-04-13 DIAGNOSIS — K2970 Gastritis, unspecified, without bleeding: Secondary | ICD-10-CM

## 2022-04-13 LAB — HIV-1 RNA QUANT-NO REFLEX-BLD
HIV 1 RNA Quant: NOT DETECTED Copies/mL
HIV-1 RNA Quant, Log: NOT DETECTED Log cps/mL

## 2022-04-13 LAB — RPR: RPR Ser Ql: NONREACTIVE

## 2022-04-13 MED ORDER — OMEPRAZOLE 40 MG PO CPDR: 40.0000 mg | DELAYED_RELEASE_CAPSULE | Freq: Two times a day (BID) | ORAL | 3 refills | Status: DC

## 2022-04-13 MED ORDER — DICYCLOMINE HCL 10 MG PO CAPS: 10.0000 mg | ORAL_CAPSULE | Freq: Three times a day (TID) | ORAL | 1 refills | Status: DC

## 2022-04-13 NOTE — Telephone Encounter (Signed)
PMP was Reviewed.  Dr Joesph Fillers note was reviewed.  Tramadol e-scribed today.

## 2022-04-13 NOTE — Patient Instructions (Addendum)
It was my pleasure to provide care to you today. Based on our discussion, I am providing you with my recommendations below:  RECOMMENDATION(S): Your provider has ordered "Diatherix" stool testing for you. You have received a kit from our office today containing all necessary supplies to complete this test. Please carefully read the stool collection instructions provided in the kit before opening the accompanying materials. In addition, be sure to place the label from the top left corner of the laboratory request sheet onto the "puritan opti-swab" tube that is supplied in the kit. This label should include your full name and date of birth. After completing the test, you should secure the purtian tube into the specimen biohazard bag. The laboratory request information sheet (including date and time of specimen collection) should be placed into the outside pocket of the specimen biohazard bag and returned to the [facility] lab with 2 days of collection.   We have sent the following medications to your pharmacy for you to pick up at your convenience: Omeprazole (Increase your omeprazole to 40 mg - Take 1 pill twice daily.   Resume taking Dicyclomine( refills sent)    FOLLOW UP:  05/30/22 at 2:00 pm with Tye Savoy NP    BMI:  _______________________________________________________  If your blood pressure at your visit was 140/90 or greater, please contact your primary care physician to follow up on this.  _______________________________________________________  If you are age 59 or older, your body mass index should be between 23-30. Your Body mass index is 31.05 kg/m. If this is out of the aforementioned range listed, please consider follow up with your Primary Care Provider.  If you are age 74 or younger, your body mass index should be between 19-25. Your Body mass index is 31.05 kg/m. If this is out of the aformentioned range listed, please consider follow up with your Primary Care Provider.    MY CHART:  The [place] [facility] providers would like to encourage you to use [facility] to communicate with providers for non-urgent requests or questions.  Due to long hold times on the telephone, sending your provider a message by [facility] may be a faster and more efficient way to get a response.  Please allow 48 business hours for a response.  Please remember that this is for non-urgent requests.   Thank you for trusting me with your gastrointestinal care!    Thornton Park, MD, MPH
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
## 2022-04-14 ENCOUNTER — Other Ambulatory Visit: Payer: Self-pay | Admitting: Nurse Practitioner

## 2022-04-18 DIAGNOSIS — R109 Unspecified abdominal pain: Secondary | ICD-10-CM | POA: Diagnosis not present

## 2022-04-18 DIAGNOSIS — Z8619 Personal history of other infectious and parasitic diseases: Secondary | ICD-10-CM | POA: Diagnosis not present

## 2022-04-25 ENCOUNTER — Encounter: Payer: Self-pay | Admitting: Internal Medicine

## 2022-04-25 ENCOUNTER — Ambulatory Visit (INDEPENDENT_AMBULATORY_CARE_PROVIDER_SITE_OTHER): Payer: BC Managed Care – PPO

## 2022-04-25 ENCOUNTER — Other Ambulatory Visit: Payer: Self-pay

## 2022-04-25 ENCOUNTER — Ambulatory Visit (INDEPENDENT_AMBULATORY_CARE_PROVIDER_SITE_OTHER): Payer: BC Managed Care – PPO | Admitting: Internal Medicine

## 2022-04-25 VITALS — BP 112/82 | HR 97 | Temp 97.5°F | Wt 162.0 lb

## 2022-04-25 DIAGNOSIS — Z21 Asymptomatic human immunodeficiency virus [HIV] infection status: Secondary | ICD-10-CM

## 2022-04-25 DIAGNOSIS — Z23 Encounter for immunization: Secondary | ICD-10-CM | POA: Diagnosis not present

## 2022-04-25 NOTE — Assessment & Plan Note (Signed)
Vaccine booster given today

## 2022-04-25 NOTE — Assessment & Plan Note (Signed)
She continues to do well.  Not interested in treatment at this time as she does not have any detectable virus.  Will have her return in 3 months to recheck the CD4 since it is low today.

## 2022-04-25 NOTE — Progress Notes (Signed)
    Subjective:    Patient ID: Tina Huber, female    DOB: 05-28-1975, 47 y.o.   MRN: [id number]  HPI Here for follow up of HIV She continues off of ARVs as a long term non-progressor.  No new issues.  CD4 is a bit lower than previous.  Had labs at the beginning of a viral illness.  No new concerns.    Review of Systems  Constitutional:  Negative for fatigue.  Gastrointestinal:  Negative for diarrhea.  Skin:  Negative for rash.       Objective:   Physical Exam Skin:    Findings: No rash.  Neurological:     Mental Status: She is alert.           Assessment & Plan:

## 2022-04-27 LAB — HEPATITIS B SURFACE ANTIBODY, QUANTITATIVE: Hep B S AB Quant (Post): 682 m[IU]/mL (ref >=10)

## 2022-04-27 LAB — MEASLES/MUMPS/RUBELLA IMMUNITY
Mumps IgG: 186 AU/mL
Rubella: 3.42 Index
Rubeola IgG: >300 AU/mL

## 2022-04-27 LAB — QUANTIFERON-TB GOLD PLUS
Mitogen-NIL: >10 IU/mL
NIL: 0.09 IU/mL
QuantiFERON-TB Gold Plus: NEGATIVE
TB1-NIL: 0.02 IU/mL
TB2-NIL: 0.03 IU/mL

## 2022-04-30 ENCOUNTER — Ambulatory Visit: Payer: BC Managed Care – PPO | Admitting: Registered"

## 2022-05-04 DIAGNOSIS — M069 Rheumatoid arthritis, unspecified: Secondary | ICD-10-CM | POA: Diagnosis not present

## 2022-05-04 DIAGNOSIS — M5441 Lumbago with sciatica, right side: Secondary | ICD-10-CM | POA: Diagnosis not present

## 2022-05-04 DIAGNOSIS — M797 Fibromyalgia: Secondary | ICD-10-CM | POA: Diagnosis not present

## 2022-05-05 DIAGNOSIS — M797 Fibromyalgia: Secondary | ICD-10-CM | POA: Diagnosis not present

## 2022-05-05 DIAGNOSIS — M5441 Lumbago with sciatica, right side: Secondary | ICD-10-CM | POA: Diagnosis not present

## 2022-05-05 DIAGNOSIS — M069 Rheumatoid arthritis, unspecified: Secondary | ICD-10-CM | POA: Diagnosis not present

## 2022-05-07 ENCOUNTER — Other Ambulatory Visit: Payer: Self-pay | Admitting: Nurse Practitioner

## 2022-05-07 DIAGNOSIS — G894 Chronic pain syndrome: Secondary | ICD-10-CM | POA: Diagnosis not present

## 2022-05-07 DIAGNOSIS — M1991 Primary osteoarthritis, unspecified site: Secondary | ICD-10-CM | POA: Diagnosis not present

## 2022-05-07 DIAGNOSIS — G5603 Carpal tunnel syndrome, bilateral upper limbs: Secondary | ICD-10-CM | POA: Diagnosis not present

## 2022-05-07 DIAGNOSIS — R768 Other specified abnormal immunological findings in serum: Secondary | ICD-10-CM | POA: Diagnosis not present

## 2022-05-14 ENCOUNTER — Encounter: Payer: BC Managed Care – PPO | Admitting: Physical Medicine & Rehabilitation

## 2022-05-14 ENCOUNTER — Other Ambulatory Visit: Payer: Self-pay | Admitting: Nurse Practitioner

## 2022-05-14 DIAGNOSIS — M545 Low back pain, unspecified: Secondary | ICD-10-CM

## 2022-05-14 NOTE — Telephone Encounter (Signed)
Please advise KH 

## 2022-05-18 ENCOUNTER — Encounter
Payer: BC Managed Care – PPO | Attending: Physical Medicine & Rehabilitation | Admitting: Physical Medicine & Rehabilitation

## 2022-05-18 VITALS — BP 131/87 | HR 105 | Ht 60.0 in | Wt 164.0 lb

## 2022-05-18 DIAGNOSIS — G894 Chronic pain syndrome: Secondary | ICD-10-CM

## 2022-05-18 DIAGNOSIS — Z5181 Encounter for therapeutic drug level monitoring: Secondary | ICD-10-CM | POA: Insufficient documentation

## 2022-05-18 DIAGNOSIS — Z79891 Long term (current) use of opiate analgesic: Secondary | ICD-10-CM

## 2022-05-18 MED ORDER — TRAMADOL HCL 50 MG PO TABS: 50.0000 mg | ORAL_TABLET | Freq: Four times a day (QID) | ORAL | 0 refills | Status: DC | PRN
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
## 2022-05-20 ENCOUNTER — Encounter: Payer: Self-pay | Admitting: Physical Medicine & Rehabilitation

## 2022-05-24 ENCOUNTER — Encounter: Payer: Self-pay | Admitting: Nurse Practitioner

## 2022-05-24 ENCOUNTER — Ambulatory Visit: Payer: BC Managed Care – PPO | Admitting: Nurse Practitioner

## 2022-05-24 ENCOUNTER — Other Ambulatory Visit: Payer: Self-pay

## 2022-05-24 ENCOUNTER — Telehealth: Payer: Self-pay

## 2022-05-24 DIAGNOSIS — E7800 Pure hypercholesterolemia, unspecified: Secondary | ICD-10-CM | POA: Diagnosis not present

## 2022-05-24 DIAGNOSIS — M545 Low back pain, unspecified: Secondary | ICD-10-CM | POA: Diagnosis not present

## 2022-05-24 DIAGNOSIS — G8929 Other chronic pain: Secondary | ICD-10-CM | POA: Diagnosis not present

## 2022-05-24 LAB — TOXASSURE SELECT,+ANTIDEPR,UR

## 2022-05-24 MED ORDER — METOPROLOL TARTRATE 25 MG PO TABS: ORAL_TABLET | ORAL | 1 refills | Status: DC

## 2022-05-24 MED ORDER — ATORVASTATIN CALCIUM 10 MG PO TABS
10.0000 mg | ORAL_TABLET | Freq: Every day | ORAL | 1 refills | Status: DC
Start: 2022-05-24 — End: 2023-07-03

## 2022-05-24 MED ORDER — CYCLOBENZAPRINE HCL 10 MG PO TABS: 10.0000 mg | ORAL_TABLET | Freq: Every day | ORAL | 3 refills | Status: DC

## 2022-05-24 MED ORDER — ATORVASTATIN CALCIUM 10 MG PO TABS
10.0000 mg | ORAL_TABLET | Freq: Every day | ORAL | 1 refills | Status: DC
Start: 2022-05-24 — End: 2022-05-24

## 2022-05-24 MED ORDER — ONDANSETRON HCL 4 MG PO TABS: 4.0000 mg | ORAL_TABLET | Freq: Every day | ORAL | 0 refills | Status: AC | PRN

## 2022-05-24 NOTE — Assessment & Plan Note (Signed)
-   cyclobenzaprine (FLEXERIL) 10 MG tablet; Take 1 tablet (10 mg total) by mouth at bedtime.  Dispense: 30 tablet; Refill: 3  2. Hypercholesteremia  - atorvastatin (LIPITOR) 10 MG tablet; Take 1 tablet (10 mg total) by mouth daily.  Dispense: 90 tablet; Refill: 1   Follow up:  Follow up in 6 months

## 2022-05-24 NOTE — Patient Instructions (Signed)
1. Chronic bilateral low back pain without sciatica  - cyclobenzaprine (FLEXERIL) 10 MG tablet; Take 1 tablet (10 mg total) by mouth at bedtime.  Dispense: 30 tablet; Refill: 3  2. Hypercholesteremia  - atorvastatin (LIPITOR) 10 MG tablet; Take 1 tablet (10 mg total) by mouth daily.  Dispense: 90 tablet; Refill: 1   Follow up:  Follow up in 6 months

## 2022-05-24 NOTE — Telephone Encounter (Signed)
Patient states Tramadol was never sent in for her. I see that is says it was printed not escribe. [employer]
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
## 2022-05-25 MED ORDER — TRAMADOL HCL 50 MG PO TABS: 50.0000 mg | ORAL_TABLET | Freq: Four times a day (QID) | ORAL | 0 refills | Status: DC | PRN

## 2022-05-30 ENCOUNTER — Ambulatory Visit: Payer: BC Managed Care – PPO | Admitting: Nurse Practitioner
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
## 2022-06-03 DIAGNOSIS — M5441 Lumbago with sciatica, right side: Secondary | ICD-10-CM | POA: Diagnosis not present

## 2022-06-03 DIAGNOSIS — M797 Fibromyalgia: Secondary | ICD-10-CM | POA: Diagnosis not present

## 2022-06-03 DIAGNOSIS — M069 Rheumatoid arthritis, unspecified: Secondary | ICD-10-CM | POA: Diagnosis not present

## 2022-06-04 DIAGNOSIS — M797 Fibromyalgia: Secondary | ICD-10-CM | POA: Diagnosis not present

## 2022-06-04 DIAGNOSIS — M069 Rheumatoid arthritis, unspecified: Secondary | ICD-10-CM | POA: Diagnosis not present

## 2022-06-04 DIAGNOSIS — M5441 Lumbago with sciatica, right side: Secondary | ICD-10-CM | POA: Diagnosis not present

## 2022-06-12 DIAGNOSIS — R7989 Other specified abnormal findings of blood chemistry: Secondary | ICD-10-CM | POA: Diagnosis not present

## 2022-06-27 DIAGNOSIS — Z23 Encounter for immunization: Secondary | ICD-10-CM | POA: Diagnosis not present

## 2022-06-28 ENCOUNTER — Encounter: Payer: Self-pay | Admitting: Gastroenterology

## 2022-06-29 ENCOUNTER — Encounter: Payer: Self-pay | Admitting: Physical Medicine & Rehabilitation

## 2022-06-29 ENCOUNTER — Encounter
Payer: BC Managed Care – PPO | Attending: Physical Medicine & Rehabilitation | Admitting: Physical Medicine & Rehabilitation

## 2022-06-29 VITALS — BP 104/73 | HR 109 | Ht 60.0 in | Wt 162.0 lb

## 2022-06-29 DIAGNOSIS — M069 Rheumatoid arthritis, unspecified: Secondary | ICD-10-CM | POA: Diagnosis not present

## 2022-06-29 DIAGNOSIS — M797 Fibromyalgia: Secondary | ICD-10-CM

## 2022-06-29 DIAGNOSIS — G8929 Other chronic pain: Secondary | ICD-10-CM

## 2022-06-29 DIAGNOSIS — Z79891 Long term (current) use of opiate analgesic: Secondary | ICD-10-CM

## 2022-06-29 DIAGNOSIS — M5441 Lumbago with sciatica, right side: Secondary | ICD-10-CM

## 2022-06-29 MED ORDER — PREGABALIN 100 MG PO CAPS: 100.0000 mg | ORAL_CAPSULE | Freq: Two times a day (BID) | ORAL | 4 refills | Status: DC

## 2022-06-29 MED ORDER — TRAMADOL HCL 50 MG PO TABS: 50.0000 mg | ORAL_TABLET | Freq: Four times a day (QID) | ORAL | 0 refills | Status: DC | PRN
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
## 2022-06-30 ENCOUNTER — Encounter: Payer: Self-pay | Admitting: Physical Medicine & Rehabilitation

## 2022-07-04 DIAGNOSIS — M5441 Lumbago with sciatica, right side: Secondary | ICD-10-CM | POA: Diagnosis not present

## 2022-07-04 DIAGNOSIS — M797 Fibromyalgia: Secondary | ICD-10-CM | POA: Diagnosis not present

## 2022-07-04 DIAGNOSIS — M069 Rheumatoid arthritis, unspecified: Secondary | ICD-10-CM | POA: Diagnosis not present

## 2022-07-05 DIAGNOSIS — M797 Fibromyalgia: Secondary | ICD-10-CM | POA: Diagnosis not present

## 2022-07-05 DIAGNOSIS — M069 Rheumatoid arthritis, unspecified: Secondary | ICD-10-CM | POA: Diagnosis not present

## 2022-07-05 DIAGNOSIS — M5441 Lumbago with sciatica, right side: Secondary | ICD-10-CM | POA: Diagnosis not present

## 2022-07-20 ENCOUNTER — Ambulatory Visit: Payer: BC Managed Care – PPO | Attending: Physical Medicine & Rehabilitation | Admitting: Physical Therapy

## 2022-07-20 DIAGNOSIS — M6281 Muscle weakness (generalized): Secondary | ICD-10-CM | POA: Insufficient documentation

## 2022-07-20 DIAGNOSIS — M797 Fibromyalgia: Secondary | ICD-10-CM | POA: Insufficient documentation

## 2022-07-20 DIAGNOSIS — M5459 Other low back pain: Secondary | ICD-10-CM | POA: Insufficient documentation

## 2022-07-20 DIAGNOSIS — R2689 Other abnormalities of gait and mobility: Secondary | ICD-10-CM | POA: Insufficient documentation

## 2022-07-20 DIAGNOSIS — G8929 Other chronic pain: Secondary | ICD-10-CM | POA: Insufficient documentation

## 2022-07-20 DIAGNOSIS — M5441 Lumbago with sciatica, right side: Secondary | ICD-10-CM | POA: Insufficient documentation

## 2022-07-25 ENCOUNTER — Ambulatory Visit: Payer: BC Managed Care – PPO | Admitting: Physical Therapy

## 2022-07-25 DIAGNOSIS — R2689 Other abnormalities of gait and mobility: Secondary | ICD-10-CM | POA: Diagnosis not present

## 2022-07-25 DIAGNOSIS — M797 Fibromyalgia: Secondary | ICD-10-CM | POA: Diagnosis not present

## 2022-07-25 DIAGNOSIS — G8929 Other chronic pain: Secondary | ICD-10-CM | POA: Diagnosis not present

## 2022-07-25 DIAGNOSIS — M6281 Muscle weakness (generalized): Secondary | ICD-10-CM

## 2022-07-25 DIAGNOSIS — M5459 Other low back pain: Secondary | ICD-10-CM | POA: Diagnosis not present

## 2022-07-25 DIAGNOSIS — M5441 Lumbago with sciatica, right side: Secondary | ICD-10-CM | POA: Diagnosis not present
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
## 2022-07-26 ENCOUNTER — Other Ambulatory Visit: Payer: Self-pay

## 2022-07-26 ENCOUNTER — Other Ambulatory Visit: Payer: BC Managed Care – PPO

## 2022-07-26 DIAGNOSIS — Z21 Asymptomatic human immunodeficiency virus [HIV] infection status: Secondary | ICD-10-CM | POA: Diagnosis not present

## 2022-07-27 ENCOUNTER — Telehealth: Payer: Self-pay | Admitting: Physical Therapy

## 2022-07-27 DIAGNOSIS — M25559 Pain in unspecified hip: Secondary | ICD-10-CM

## 2022-07-27 LAB — T-HELPER CELL (CD4) - (RCID CLINIC ONLY)
CD4 % Helper T Cell: 38 % (ref 33–65)
CD4 T Cell Abs: 881 /uL (ref 400–1790)

## 2022-07-27 NOTE — Telephone Encounter (Signed)
Dr. Benjie Karvonen,  Tina Huber was evaluated by Physical Therapy on 07/25/22 for her chronic pain related to her fibromyalgia.  The patient would benefit from a Physical Therapy evaluation/referral to Pelvic Floor Physical Therapy as well for pelvic pain and tightness.    If you agree, please place an order in [facility] workque in [place].  Thank you, Peter Congo, PT, DPT, [facility] [address] [place], [place]  [address] Phone:  [phone] Fax:  [phone]

## 2022-07-28 LAB — HIV-1 RNA QUANT-NO REFLEX-BLD
HIV 1 RNA Quant: 24 Copies/mL — ABNORMAL HIGH
HIV-1 RNA Quant, Log: 1.38 Log cps/mL — ABNORMAL HIGH

## 2022-07-31 ENCOUNTER — Ambulatory Visit: Payer: BC Managed Care – PPO | Attending: Physical Medicine & Rehabilitation | Admitting: Physical Therapy

## 2022-07-31 DIAGNOSIS — M25559 Pain in unspecified hip: Secondary | ICD-10-CM | POA: Diagnosis not present

## 2022-07-31 DIAGNOSIS — M797 Fibromyalgia: Secondary | ICD-10-CM | POA: Diagnosis not present

## 2022-07-31 DIAGNOSIS — M5459 Other low back pain: Secondary | ICD-10-CM | POA: Insufficient documentation

## 2022-07-31 DIAGNOSIS — R2689 Other abnormalities of gait and mobility: Secondary | ICD-10-CM | POA: Diagnosis not present

## 2022-07-31 DIAGNOSIS — R1084 Generalized abdominal pain: Secondary | ICD-10-CM | POA: Diagnosis not present

## 2022-07-31 DIAGNOSIS — M5441 Lumbago with sciatica, right side: Secondary | ICD-10-CM | POA: Insufficient documentation

## 2022-07-31 DIAGNOSIS — R252 Cramp and spasm: Secondary | ICD-10-CM | POA: Insufficient documentation

## 2022-07-31 DIAGNOSIS — R102 Pelvic and perineal pain: Secondary | ICD-10-CM | POA: Diagnosis not present

## 2022-07-31 DIAGNOSIS — G8929 Other chronic pain: Secondary | ICD-10-CM | POA: Diagnosis not present

## 2022-07-31 DIAGNOSIS — M6281 Muscle weakness (generalized): Secondary | ICD-10-CM | POA: Insufficient documentation
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
## 2022-08-01 ENCOUNTER — Encounter: Payer: Self-pay | Admitting: Physical Therapy

## 2022-08-03 DIAGNOSIS — M069 Rheumatoid arthritis, unspecified: Secondary | ICD-10-CM | POA: Diagnosis not present

## 2022-08-03 DIAGNOSIS — M797 Fibromyalgia: Secondary | ICD-10-CM | POA: Diagnosis not present

## 2022-08-03 DIAGNOSIS — M5441 Lumbago with sciatica, right side: Secondary | ICD-10-CM | POA: Diagnosis not present

## 2022-08-04 DIAGNOSIS — M5441 Lumbago with sciatica, right side: Secondary | ICD-10-CM | POA: Diagnosis not present

## 2022-08-04 DIAGNOSIS — M797 Fibromyalgia: Secondary | ICD-10-CM | POA: Diagnosis not present

## 2022-08-04 DIAGNOSIS — M069 Rheumatoid arthritis, unspecified: Secondary | ICD-10-CM | POA: Diagnosis not present

## 2022-08-06 DIAGNOSIS — Z23 Encounter for immunization: Secondary | ICD-10-CM | POA: Diagnosis not present

## 2022-08-10 ENCOUNTER — Ambulatory Visit: Payer: BC Managed Care – PPO | Admitting: Physical Therapy

## 2022-08-10 DIAGNOSIS — M5441 Lumbago with sciatica, right side: Secondary | ICD-10-CM | POA: Diagnosis not present

## 2022-08-10 DIAGNOSIS — M5459 Other low back pain: Secondary | ICD-10-CM

## 2022-08-10 DIAGNOSIS — R1084 Generalized abdominal pain: Secondary | ICD-10-CM | POA: Diagnosis not present

## 2022-08-10 DIAGNOSIS — M6281 Muscle weakness (generalized): Secondary | ICD-10-CM

## 2022-08-10 DIAGNOSIS — R252 Cramp and spasm: Secondary | ICD-10-CM | POA: Diagnosis not present

## 2022-08-10 DIAGNOSIS — G8929 Other chronic pain: Secondary | ICD-10-CM | POA: Diagnosis not present

## 2022-08-10 DIAGNOSIS — M797 Fibromyalgia: Secondary | ICD-10-CM | POA: Diagnosis not present

## 2022-08-10 DIAGNOSIS — R2689 Other abnormalities of gait and mobility: Secondary | ICD-10-CM

## 2022-08-10 DIAGNOSIS — M25559 Pain in unspecified hip: Secondary | ICD-10-CM

## 2022-08-10 DIAGNOSIS — R102 Pelvic and perineal pain: Secondary | ICD-10-CM | POA: Diagnosis not present
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
## 2022-08-13 ENCOUNTER — Ambulatory Visit: Payer: BC Managed Care – PPO | Admitting: Physical Therapy

## 2022-08-13 ENCOUNTER — Other Ambulatory Visit: Payer: Self-pay

## 2022-08-13 ENCOUNTER — Encounter: Payer: Self-pay | Admitting: Physical Therapy

## 2022-08-13 DIAGNOSIS — R252 Cramp and spasm: Secondary | ICD-10-CM | POA: Diagnosis not present

## 2022-08-13 DIAGNOSIS — R102 Pelvic and perineal pain: Secondary | ICD-10-CM

## 2022-08-13 DIAGNOSIS — G8929 Other chronic pain: Secondary | ICD-10-CM | POA: Diagnosis not present

## 2022-08-13 DIAGNOSIS — M5441 Lumbago with sciatica, right side: Secondary | ICD-10-CM | POA: Diagnosis not present

## 2022-08-13 DIAGNOSIS — R2689 Other abnormalities of gait and mobility: Secondary | ICD-10-CM

## 2022-08-13 DIAGNOSIS — M5459 Other low back pain: Secondary | ICD-10-CM

## 2022-08-13 DIAGNOSIS — M6281 Muscle weakness (generalized): Secondary | ICD-10-CM | POA: Diagnosis not present

## 2022-08-13 DIAGNOSIS — M797 Fibromyalgia: Secondary | ICD-10-CM | POA: Diagnosis not present

## 2022-08-13 DIAGNOSIS — R1084 Generalized abdominal pain: Secondary | ICD-10-CM | POA: Diagnosis not present

## 2022-08-13 DIAGNOSIS — M25559 Pain in unspecified hip: Secondary | ICD-10-CM | POA: Diagnosis not present
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
## 2022-08-17 ENCOUNTER — Encounter
Payer: BC Managed Care – PPO | Attending: Physical Medicine & Rehabilitation | Admitting: Physical Medicine & Rehabilitation

## 2022-08-17 ENCOUNTER — Encounter: Payer: Self-pay | Admitting: Physical Medicine & Rehabilitation

## 2022-08-17 DIAGNOSIS — D573 Sickle-cell trait: Secondary | ICD-10-CM | POA: Diagnosis not present

## 2022-08-17 DIAGNOSIS — G894 Chronic pain syndrome: Secondary | ICD-10-CM | POA: Insufficient documentation

## 2022-08-17 DIAGNOSIS — G8929 Other chronic pain: Secondary | ICD-10-CM | POA: Insufficient documentation

## 2022-08-17 DIAGNOSIS — M797 Fibromyalgia: Secondary | ICD-10-CM | POA: Insufficient documentation

## 2022-08-17 DIAGNOSIS — M5441 Lumbago with sciatica, right side: Secondary | ICD-10-CM | POA: Diagnosis not present

## 2022-08-17 MED ORDER — TRAMADOL HCL 50 MG PO TABS: 50.0000 mg | ORAL_TABLET | Freq: Four times a day (QID) | ORAL | 0 refills | Status: DC | PRN
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
## 2022-08-20 ENCOUNTER — Ambulatory Visit: Payer: BC Managed Care – PPO | Admitting: Physical Therapy

## 2022-08-20 DIAGNOSIS — M6281 Muscle weakness (generalized): Secondary | ICD-10-CM | POA: Diagnosis not present

## 2022-08-20 DIAGNOSIS — R2689 Other abnormalities of gait and mobility: Secondary | ICD-10-CM | POA: Diagnosis not present

## 2022-08-20 DIAGNOSIS — G8929 Other chronic pain: Secondary | ICD-10-CM

## 2022-08-20 DIAGNOSIS — M25559 Pain in unspecified hip: Secondary | ICD-10-CM | POA: Diagnosis not present

## 2022-08-20 DIAGNOSIS — R1084 Generalized abdominal pain: Secondary | ICD-10-CM | POA: Diagnosis not present

## 2022-08-20 DIAGNOSIS — M797 Fibromyalgia: Secondary | ICD-10-CM | POA: Diagnosis not present

## 2022-08-20 DIAGNOSIS — R252 Cramp and spasm: Secondary | ICD-10-CM | POA: Diagnosis not present

## 2022-08-20 DIAGNOSIS — M5459 Other low back pain: Secondary | ICD-10-CM

## 2022-08-20 DIAGNOSIS — M5441 Lumbago with sciatica, right side: Secondary | ICD-10-CM | POA: Diagnosis not present

## 2022-08-20 DIAGNOSIS — R102 Pelvic and perineal pain: Secondary | ICD-10-CM | POA: Diagnosis not present
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
## 2022-08-29 ENCOUNTER — Ambulatory Visit (INDEPENDENT_AMBULATORY_CARE_PROVIDER_SITE_OTHER): Payer: BC Managed Care – PPO | Admitting: Internal Medicine

## 2022-08-29 ENCOUNTER — Other Ambulatory Visit: Payer: Self-pay

## 2022-08-29 ENCOUNTER — Encounter: Payer: Self-pay | Admitting: Internal Medicine

## 2022-08-29 VITALS — BP 114/84 | HR 93 | Temp 98.2°F | Resp 16 | Wt 158.0 lb

## 2022-08-29 DIAGNOSIS — B20 Human immunodeficiency virus [HIV] disease: Secondary | ICD-10-CM

## 2022-08-29 DIAGNOSIS — Z21 Asymptomatic human immunodeficiency virus [HIV] infection status: Secondary | ICD-10-CM

## 2022-08-29 NOTE — Assessment & Plan Note (Signed)
She is doing well, labs reviewed with her and no concerns.   She will continue off of medications and can return in 6 months.   I have personally spent 32 minutes involved in face-to-face and non-face-to-face activities for this patient on the day of the visit. Professional time spent includes the following activities: Preparing to see the patient (review of tests), Obtaining and/or reviewing separately obtained history (admission/discharge record), Performing a medically appropriate examination and/or evaluation , Ordering medications/tests/procedures, referring and communicating with other health care professionals, Documenting clinical information in the EMR, Independently interpreting results (not separately reported), Communicating results to the patient/family/caregiver, Counseling and educating the patient/family/caregiver and Care coordination (not separately reported).

## 2022-08-29 NOTE — Progress Notes (Signed)
    Subjective:    Patient ID: Tina Huber, female    DOB: 1975-02-27, 47 y.o.   MRN: [id number]  HPI Here for follow up of HIV She is a long term non-progressor and has opted to remain off of ARVs.  She has done well with no concerns.  Her last CD4 was much lower than previous at 292 so here for a recheck.  No new complaints.     Review of Systems  Constitutional:  Negative for fatigue.  Gastrointestinal:  Negative for diarrhea.  Skin:  Negative for rash.       Objective:   Physical Exam Eyes:     General: No scleral icterus. Pulmonary:     Effort: Pulmonary effort is normal.  Skin:    Findings: No rash.  Neurological:     Mental Status: She is alert.   SH: no tobacco        Assessment & Plan:

## 2022-09-03 ENCOUNTER — Ambulatory Visit: Payer: BC Managed Care – PPO | Admitting: Physical Therapy

## 2022-09-03 DIAGNOSIS — M069 Rheumatoid arthritis, unspecified: Secondary | ICD-10-CM | POA: Diagnosis not present

## 2022-09-03 DIAGNOSIS — M5441 Lumbago with sciatica, right side: Secondary | ICD-10-CM | POA: Diagnosis not present

## 2022-09-03 DIAGNOSIS — M797 Fibromyalgia: Secondary | ICD-10-CM | POA: Diagnosis not present

## 2022-09-04 DIAGNOSIS — M797 Fibromyalgia: Secondary | ICD-10-CM | POA: Diagnosis not present

## 2022-09-04 DIAGNOSIS — M069 Rheumatoid arthritis, unspecified: Secondary | ICD-10-CM | POA: Diagnosis not present

## 2022-09-04 DIAGNOSIS — M5441 Lumbago with sciatica, right side: Secondary | ICD-10-CM | POA: Diagnosis not present

## 2022-09-12 DIAGNOSIS — Z01419 Encounter for gynecological examination (general) (routine) without abnormal findings: Secondary | ICD-10-CM | POA: Diagnosis not present

## 2022-09-12 DIAGNOSIS — Z113 Encounter for screening for infections with a predominantly sexual mode of transmission: Secondary | ICD-10-CM | POA: Diagnosis not present

## 2022-09-17 ENCOUNTER — Ambulatory Visit: Payer: BC Managed Care – PPO | Attending: Nurse Practitioner | Admitting: Physical Therapy

## 2022-09-20 ENCOUNTER — Encounter: Payer: Self-pay | Admitting: Nurse Practitioner

## 2022-09-20 ENCOUNTER — Ambulatory Visit: Payer: BC Managed Care – PPO | Admitting: Nurse Practitioner

## 2022-09-20 VITALS — BP 123/87 | HR 95 | Temp 97.2°F | Wt 155.0 lb

## 2022-09-20 DIAGNOSIS — M5441 Lumbago with sciatica, right side: Secondary | ICD-10-CM | POA: Diagnosis not present

## 2022-09-20 DIAGNOSIS — G8929 Other chronic pain: Secondary | ICD-10-CM | POA: Diagnosis not present

## 2022-09-20 MED ORDER — PREDNISONE 10 MG PO TABS
ORAL_TABLET | ORAL | 0 refills | Status: DC
Start: 2022-09-20 — End: 2022-11-11

## 2022-09-20 NOTE — Assessment & Plan Note (Signed)
-   predniSONE (DELTASONE) 10 MG tablet; Take 4 tabs for 2 days, then 3 tabs for 2 days, then 2 tabs for 2 days, then 1 tab for 2 days, then stop  Dispense: 20 tablet; Refill: 0  Follow up:  Follow up as scheduled

## 2022-09-20 NOTE — Patient Instructions (Signed)
1. Chronic right-sided low back pain with right-sided sciatica  - predniSONE (DELTASONE) 10 MG tablet; Take 4 tabs for 2 days, then 3 tabs for 2 days, then 2 tabs for 2 days, then 1 tab for 2 days, then stop  Dispense: 20 tablet; Refill: 0   Follow up:  Follow up as scheduled
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
## 2022-09-21 IMAGING — DX DG LUMBAR SPINE COMPLETE 4+V
5 series · 5 of 5 positions shown · non-contrast
Comparison: CT 02/20/2015.

CLINICAL DATA: Back pain.

EXAM:
LUMBAR SPINE - COMPLETE 4+ VIEW

[l-spine ap]
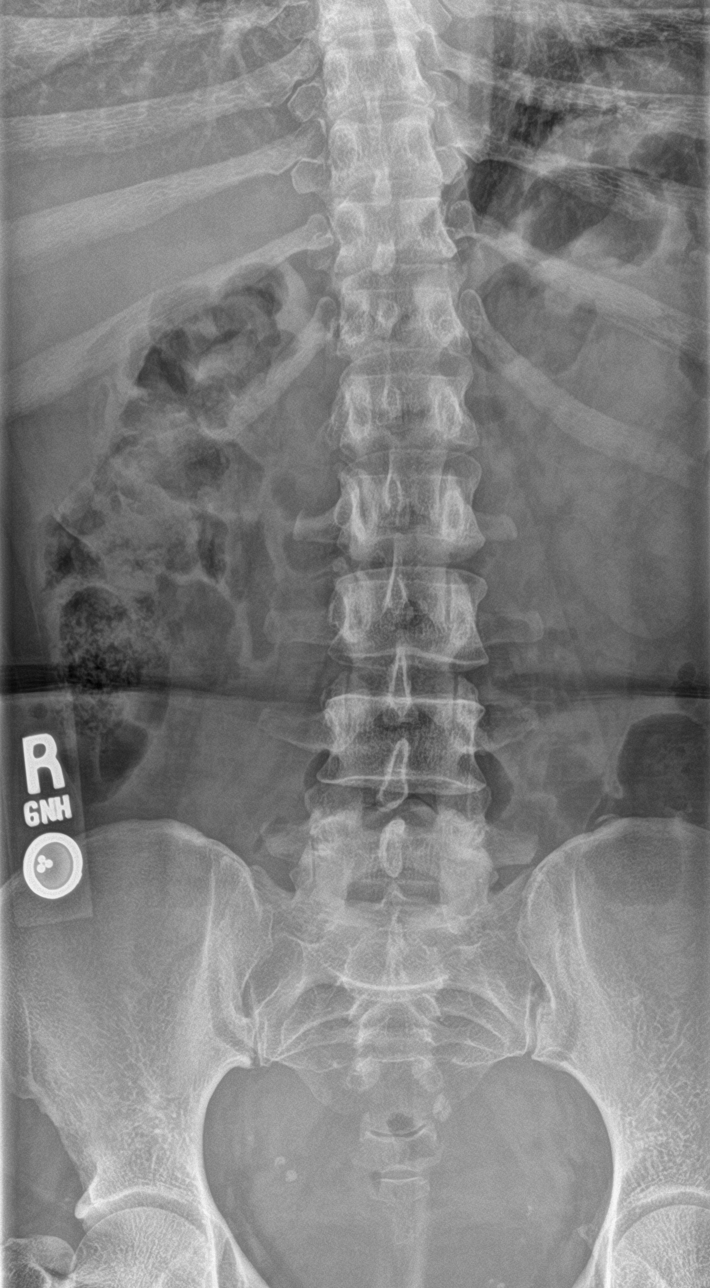

[l-spine obl (1 of 2)]
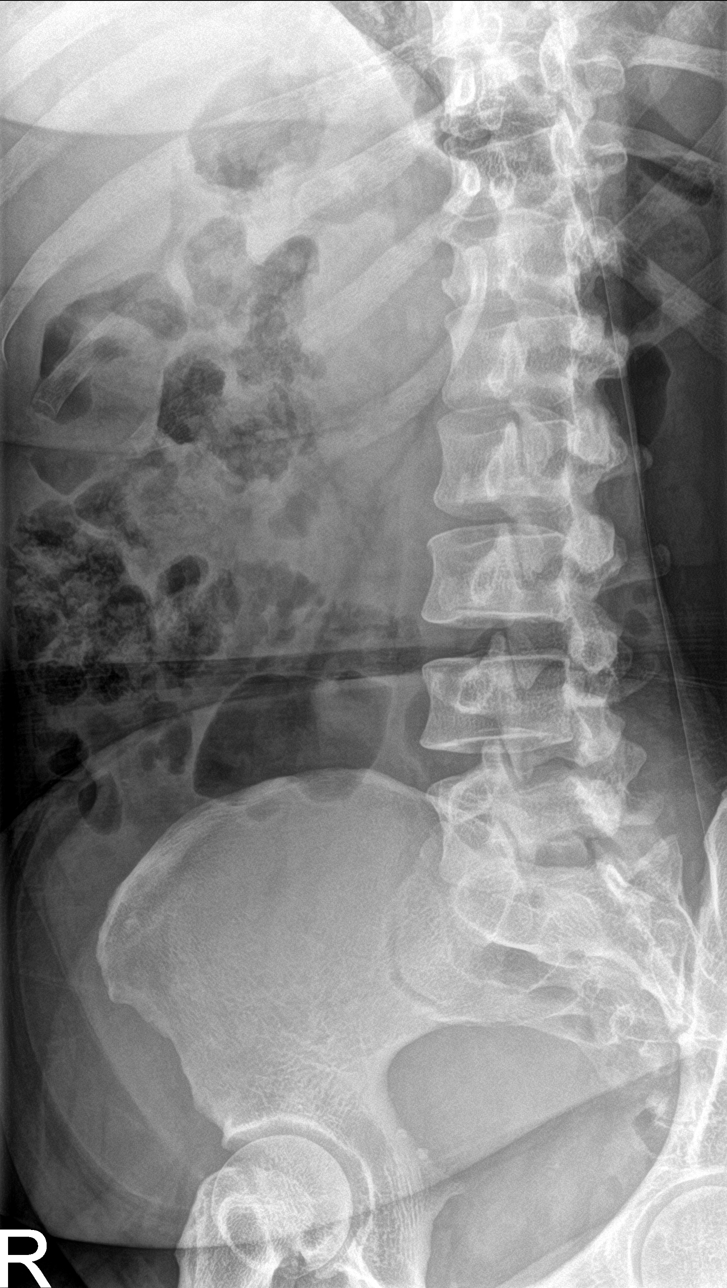

[l-spine obl (2 of 2)]
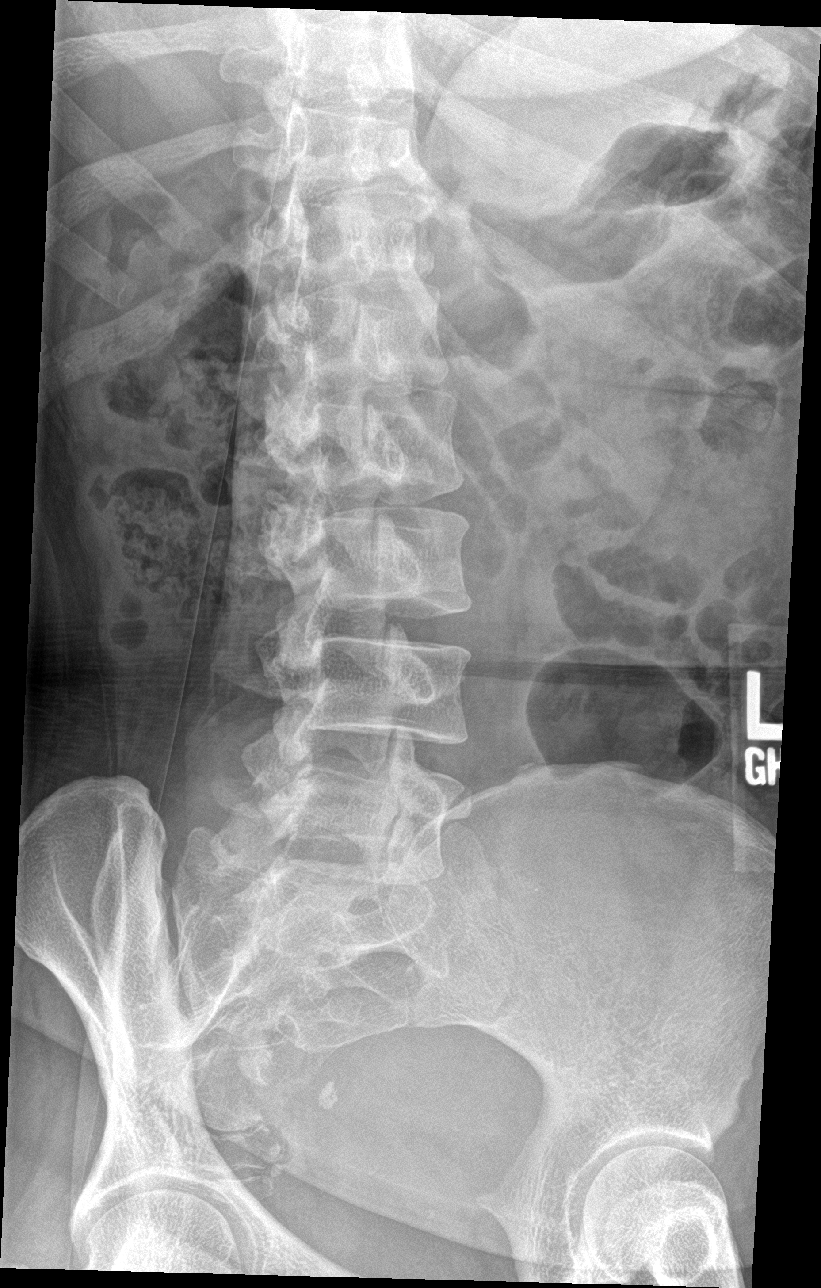

[l-spine lat]
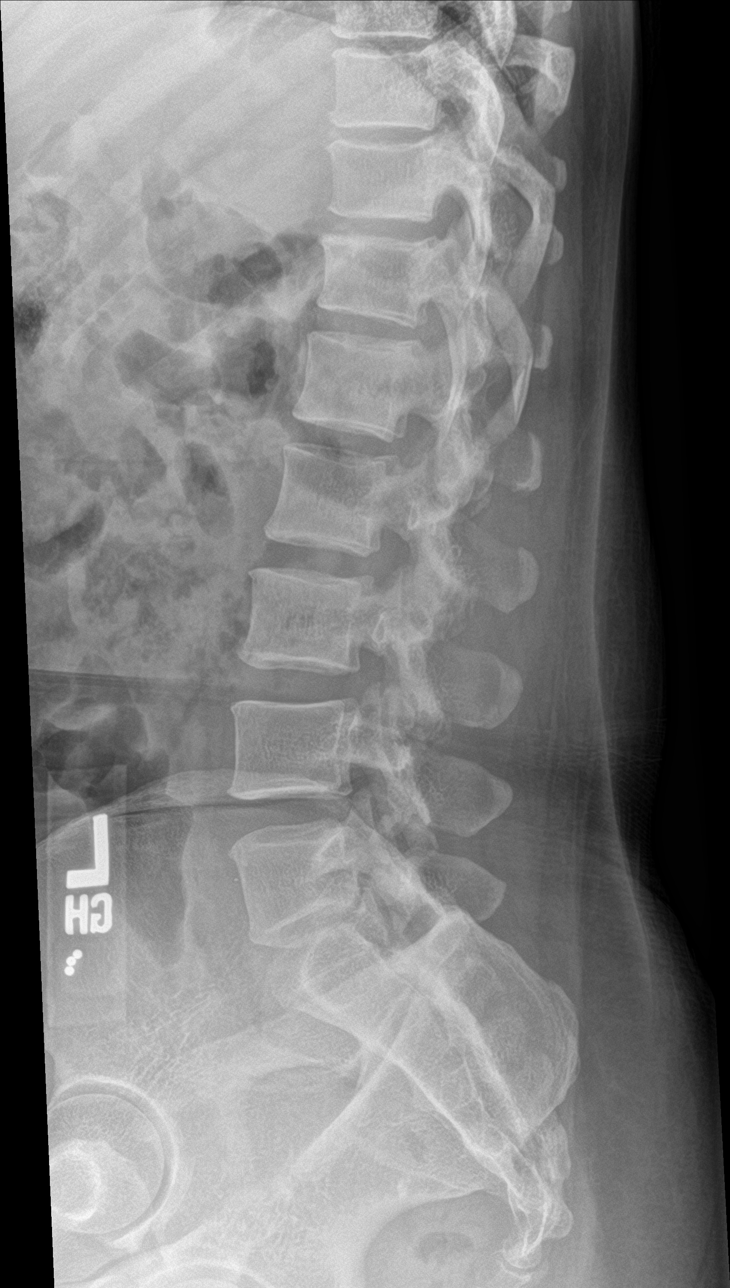

[l-spine spot]
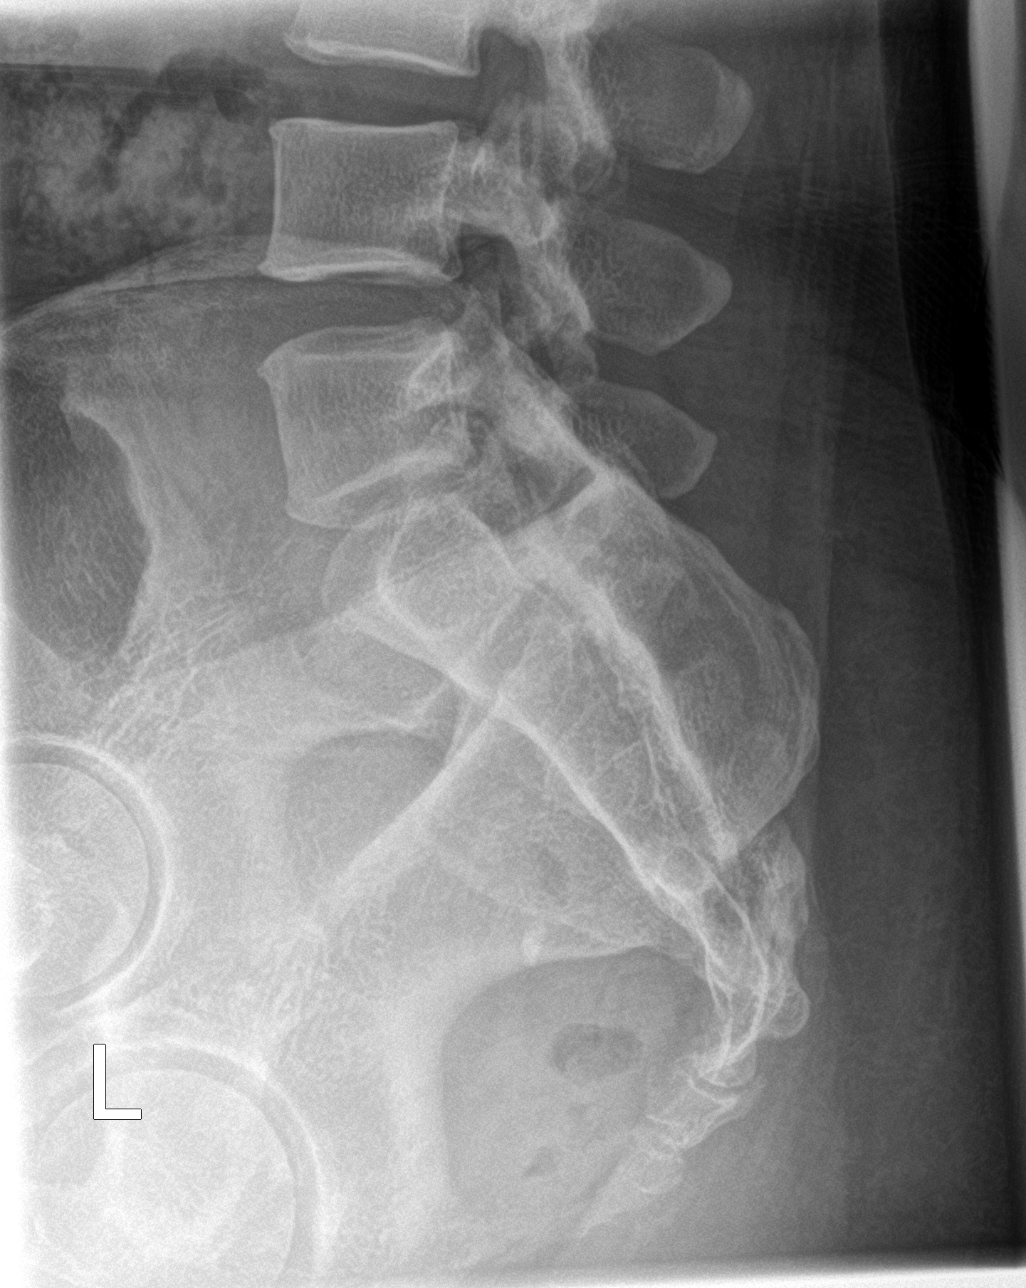

[5 of 5 positions shown; findings below may reference images not displayed]

FINDINGS: Paraspinal soft tissues are unremarkable. Mild lumbar scoliosis
concave right. No acute bony abnormality. No evidence of fracture
dislocation. Pelvic calcifications consistent phleboliths.
IMPRESSION: Mild lumbar scoliosis concave right. No acute bony abnormality
identified.

## 2022-09-21 IMAGING — DX DG HIP (WITH OR WITHOUT PELVIS) 2-3V*R*
3 series · 3 of 3 positions shown · non-contrast
Comparison: CT 02/20/2015.

CLINICAL DATA: Right hip pain/arthralgia.

EXAM:
DG HIP (WITH OR WITHOUT PELVIS) 2-3V RIGHT

[pelvis ap]
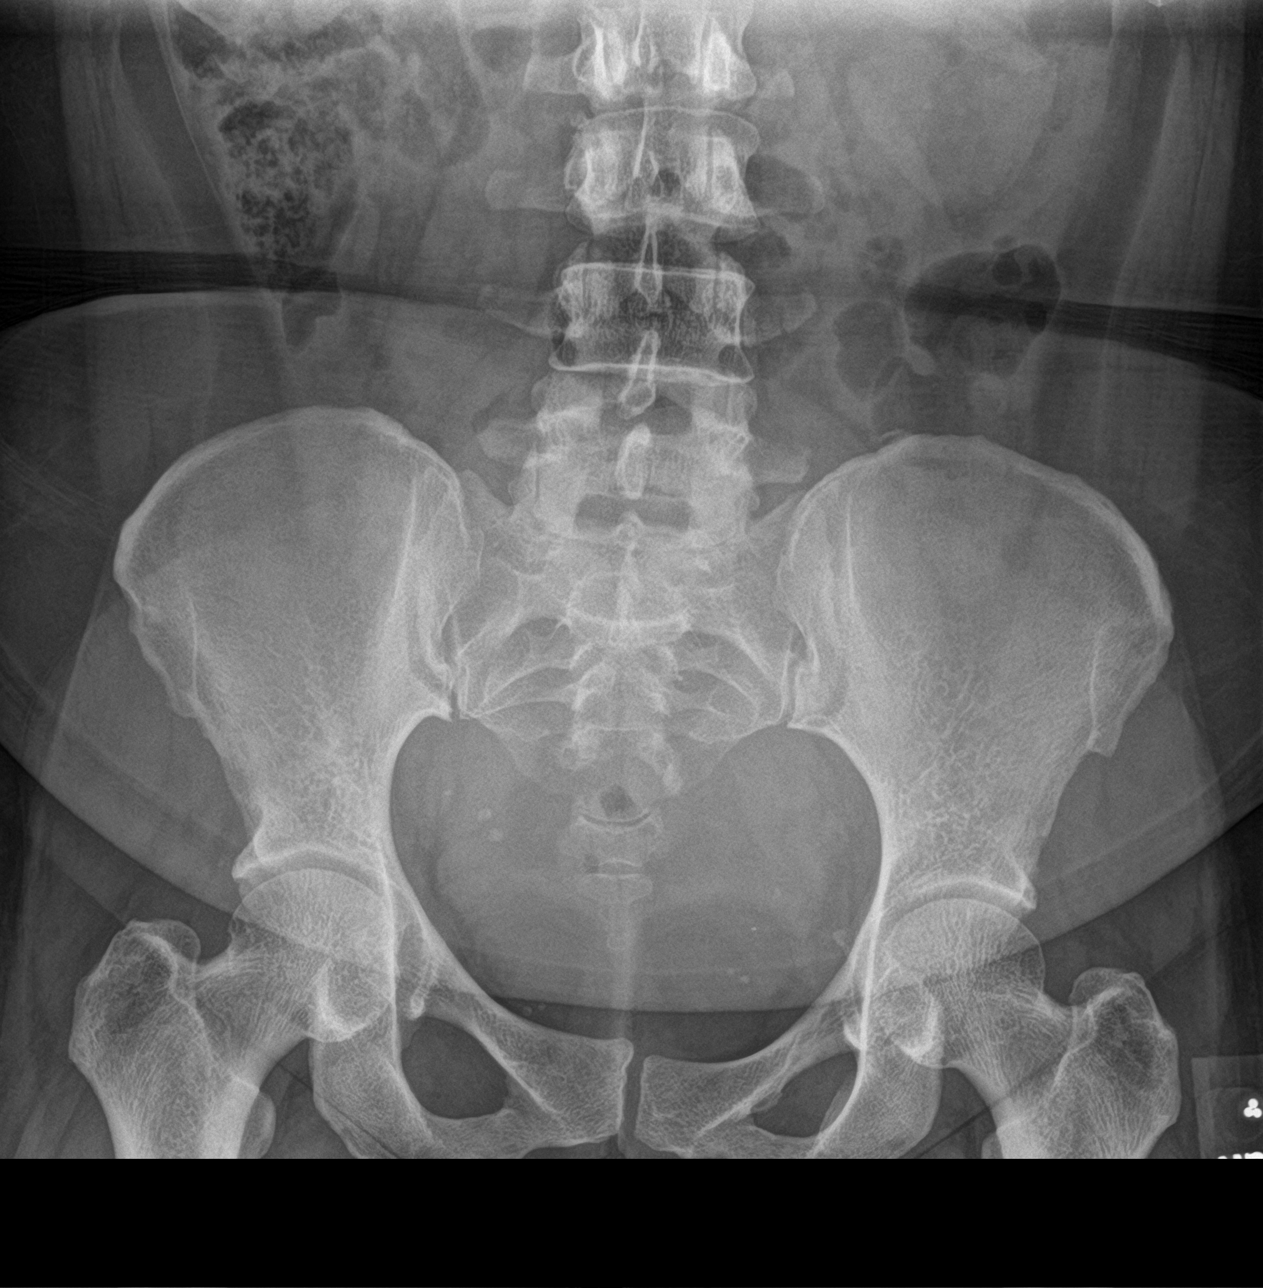

[hip ap]
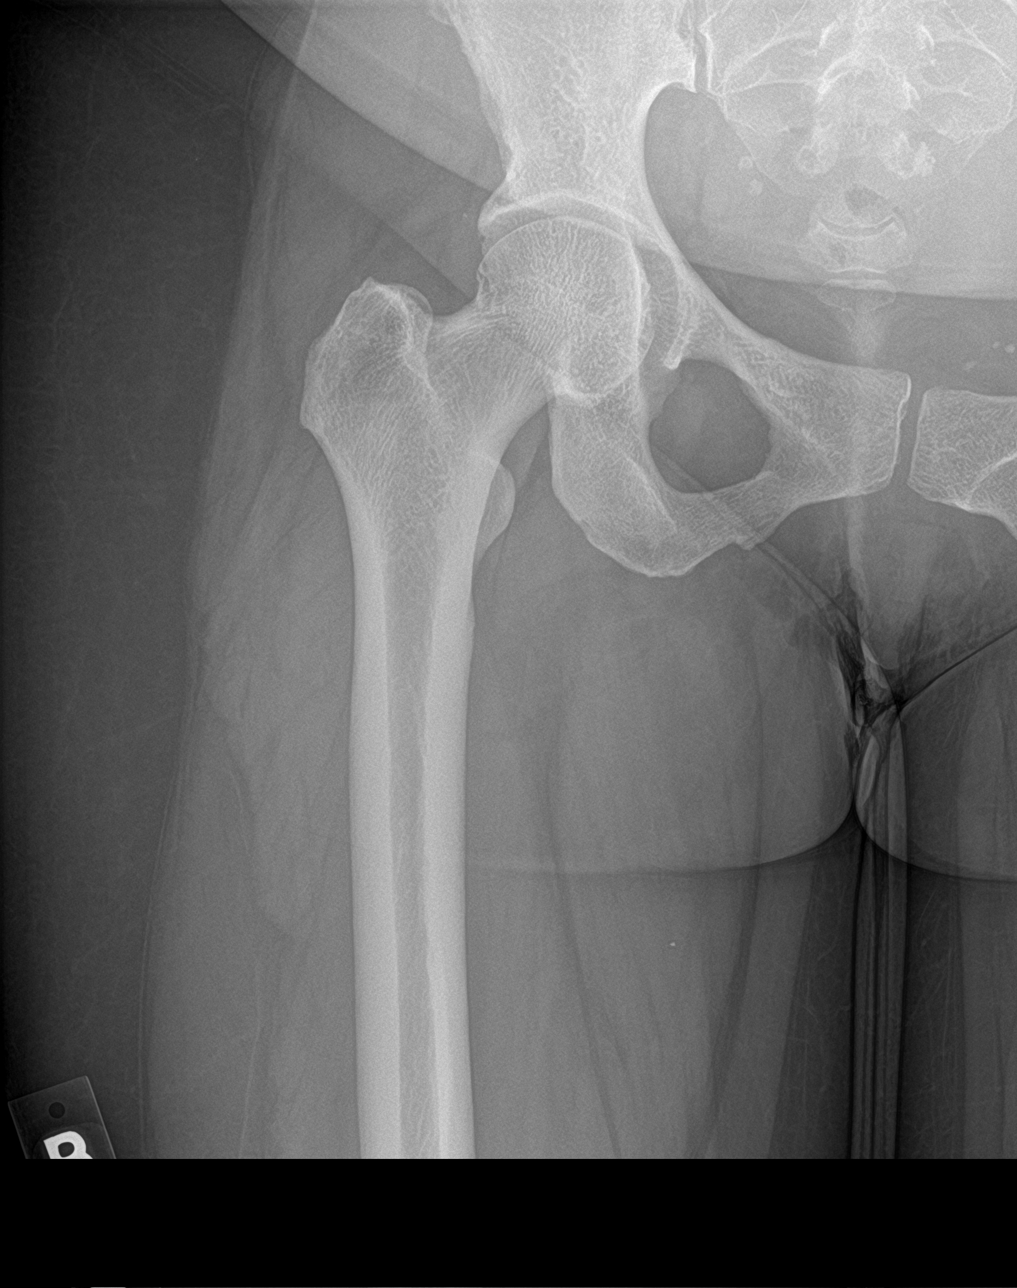

[hip lat]
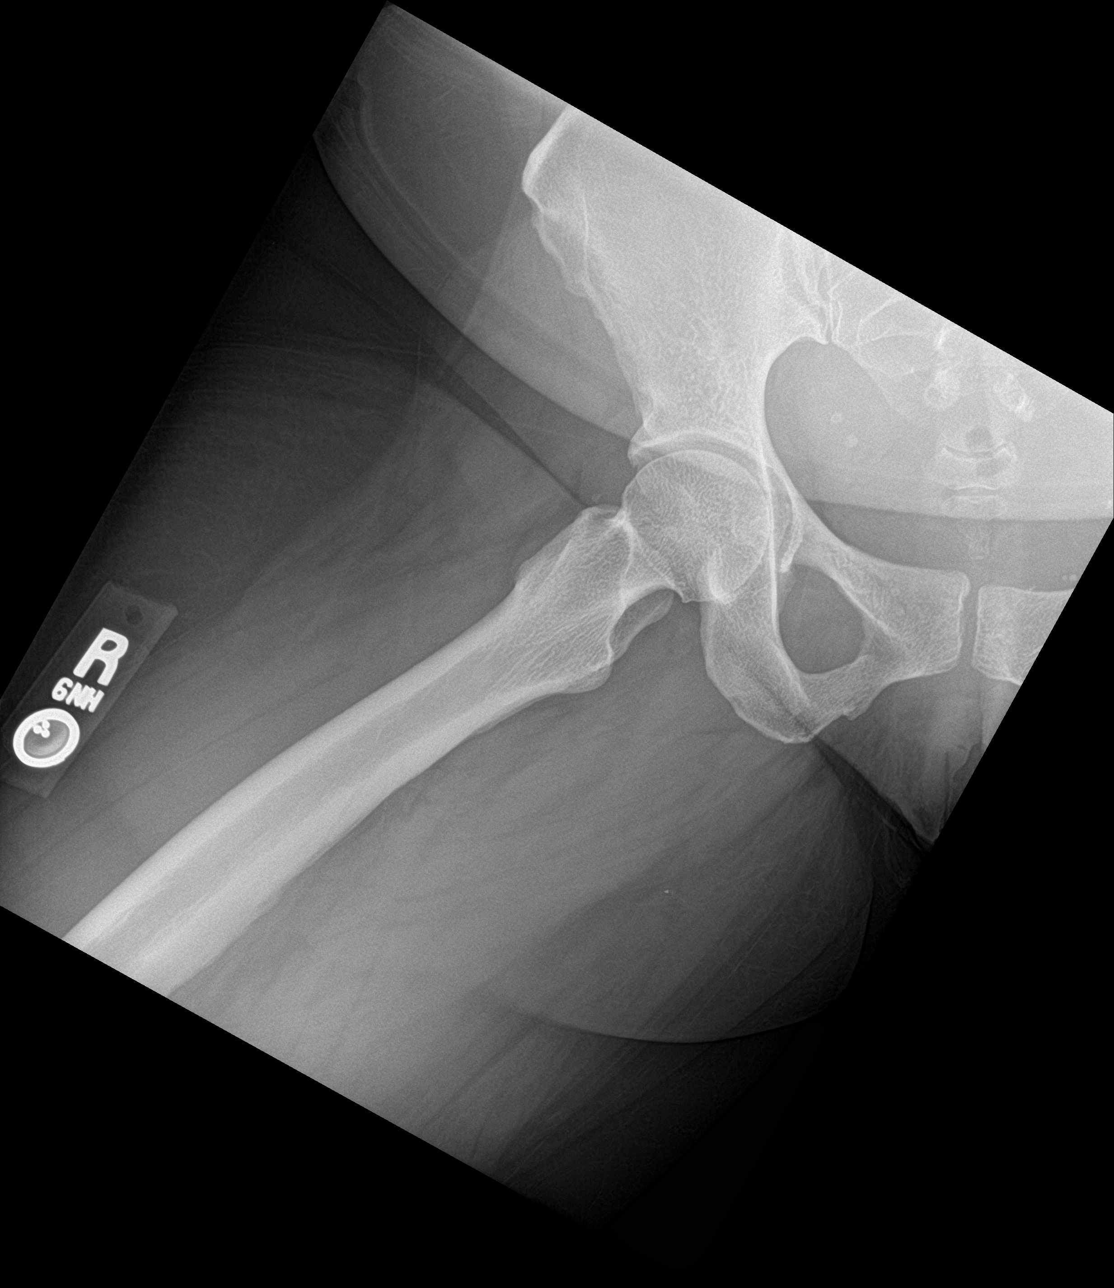

[3 of 3 positions shown; findings below may reference images not displayed]

FINDINGS: Tiny well-circumscribed bony density noted adjacent to the right
greater trochanter. This may be related prior injury or calcific
tendinosis. Mild degenerative changes right hip. No acute
abnormality. No evidence of acute fracture or dislocation. Pelvic
calcifications consistent phleboliths.
IMPRESSION: Tiny well-circumscribed bony density noted adjacent to the right
greater trochanter. This may relate to prior injury or calcific
tendinosis. Mild degenerative changes right hip. No acute
abnormality.

## 2022-10-03 ENCOUNTER — Ambulatory Visit: Payer: BC Managed Care – PPO | Admitting: Physical Therapy

## 2022-10-04 DIAGNOSIS — M069 Rheumatoid arthritis, unspecified: Secondary | ICD-10-CM | POA: Diagnosis not present

## 2022-10-04 DIAGNOSIS — M5441 Lumbago with sciatica, right side: Secondary | ICD-10-CM | POA: Diagnosis not present

## 2022-10-04 DIAGNOSIS — M797 Fibromyalgia: Secondary | ICD-10-CM | POA: Diagnosis not present

## 2022-10-05 DIAGNOSIS — M797 Fibromyalgia: Secondary | ICD-10-CM | POA: Diagnosis not present

## 2022-10-05 DIAGNOSIS — M5441 Lumbago with sciatica, right side: Secondary | ICD-10-CM | POA: Diagnosis not present

## 2022-10-05 DIAGNOSIS — M069 Rheumatoid arthritis, unspecified: Secondary | ICD-10-CM | POA: Diagnosis not present

## 2022-10-10 ENCOUNTER — Ambulatory Visit: Payer: BC Managed Care – PPO | Admitting: Physical Therapy

## 2022-10-10 ENCOUNTER — Ambulatory Visit: Payer: BC Managed Care – PPO | Attending: Physical Medicine & Rehabilitation | Admitting: Physical Therapy

## 2022-10-10 ENCOUNTER — Telehealth: Payer: Self-pay | Admitting: Physical Therapy

## 2022-10-10 DIAGNOSIS — M25559 Pain in unspecified hip: Secondary | ICD-10-CM | POA: Insufficient documentation

## 2022-10-10 DIAGNOSIS — R1084 Generalized abdominal pain: Secondary | ICD-10-CM | POA: Insufficient documentation

## 2022-10-10 DIAGNOSIS — R102 Pelvic and perineal pain: Secondary | ICD-10-CM | POA: Insufficient documentation

## 2022-10-10 DIAGNOSIS — R252 Cramp and spasm: Secondary | ICD-10-CM | POA: Insufficient documentation

## 2022-10-10 DIAGNOSIS — M5459 Other low back pain: Secondary | ICD-10-CM | POA: Insufficient documentation

## 2022-10-10 NOTE — Telephone Encounter (Signed)
Spoke to patient on the phone and she forgot about her appt. Therapist reminded her about her next appt. And she said she would be there.  Tina Huber, PT @9 /11/24@ 4:05 PM

## 2022-10-15 ENCOUNTER — Telehealth: Payer: Self-pay | Admitting: Physical Therapy

## 2022-10-15 ENCOUNTER — Ambulatory Visit: Payer: BC Managed Care – PPO | Admitting: Physical Therapy

## 2022-10-15 NOTE — Telephone Encounter (Signed)
Called patient about her missed appointment today at 14:45. She forgot about it. Therapist informed her of her next appointment. Therapist informed patient about the attendance policy and if she no-shows for next visit she will be discharged.  Eulis Foster, PT @9 /16/24@ 3:20 PM

## 2022-10-17 ENCOUNTER — Ambulatory Visit: Payer: BC Managed Care – PPO | Attending: Nurse Practitioner | Admitting: Physical Therapy

## 2022-10-17 DIAGNOSIS — M6281 Muscle weakness (generalized): Secondary | ICD-10-CM | POA: Insufficient documentation

## 2022-10-17 DIAGNOSIS — M5459 Other low back pain: Secondary | ICD-10-CM | POA: Diagnosis not present

## 2022-10-17 DIAGNOSIS — M797 Fibromyalgia: Secondary | ICD-10-CM | POA: Diagnosis not present

## 2022-10-17 DIAGNOSIS — R2689 Other abnormalities of gait and mobility: Secondary | ICD-10-CM | POA: Insufficient documentation

## 2022-10-17 DIAGNOSIS — M5441 Lumbago with sciatica, right side: Secondary | ICD-10-CM | POA: Insufficient documentation

## 2022-10-17 DIAGNOSIS — G8929 Other chronic pain: Secondary | ICD-10-CM | POA: Insufficient documentation
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
## 2022-10-18 ENCOUNTER — Encounter: Payer: BC Managed Care – PPO | Admitting: Physical Medicine & Rehabilitation

## 2022-10-22 ENCOUNTER — Encounter: Payer: Self-pay | Admitting: Physical Medicine & Rehabilitation

## 2022-10-22 ENCOUNTER — Encounter: Payer: Self-pay | Admitting: Physical Therapy

## 2022-10-22 ENCOUNTER — Ambulatory Visit: Payer: BC Managed Care – PPO | Admitting: Physical Therapy

## 2022-10-22 DIAGNOSIS — R1084 Generalized abdominal pain: Secondary | ICD-10-CM | POA: Diagnosis not present

## 2022-10-22 DIAGNOSIS — M25559 Pain in unspecified hip: Secondary | ICD-10-CM | POA: Diagnosis not present

## 2022-10-22 DIAGNOSIS — R102 Pelvic and perineal pain: Secondary | ICD-10-CM | POA: Diagnosis not present

## 2022-10-22 DIAGNOSIS — R252 Cramp and spasm: Secondary | ICD-10-CM | POA: Diagnosis not present

## 2022-10-22 DIAGNOSIS — M5459 Other low back pain: Secondary | ICD-10-CM
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
## 2022-10-29 ENCOUNTER — Telehealth: Payer: Self-pay

## 2022-10-29 ENCOUNTER — Ambulatory Visit: Payer: BC Managed Care – PPO | Admitting: Physical Therapy

## 2022-10-29 ENCOUNTER — Encounter: Payer: Self-pay | Admitting: Physical Therapy

## 2022-10-29 DIAGNOSIS — M5459 Other low back pain: Secondary | ICD-10-CM | POA: Diagnosis not present

## 2022-10-29 DIAGNOSIS — R102 Pelvic and perineal pain: Secondary | ICD-10-CM | POA: Diagnosis not present

## 2022-10-29 DIAGNOSIS — M25559 Pain in unspecified hip: Secondary | ICD-10-CM | POA: Diagnosis not present

## 2022-10-29 DIAGNOSIS — R1084 Generalized abdominal pain: Secondary | ICD-10-CM

## 2022-10-29 DIAGNOSIS — R252 Cramp and spasm: Secondary | ICD-10-CM

## 2022-10-29 NOTE — Telephone Encounter (Signed)
Called pt to find out some info for her forms she submitted. Pt  needs to advised the dates she missed school and the reason she was out per [place]. Forms will be held at my desk in the hold folder. KH
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
## 2022-10-30 ENCOUNTER — Ambulatory Visit: Payer: BC Managed Care – PPO | Admitting: Internal Medicine

## 2022-11-01 ENCOUNTER — Ambulatory Visit: Payer: BC Managed Care – PPO | Attending: Nurse Practitioner | Admitting: Physical Therapy

## 2022-11-01 DIAGNOSIS — M797 Fibromyalgia: Secondary | ICD-10-CM | POA: Insufficient documentation

## 2022-11-01 DIAGNOSIS — M5441 Lumbago with sciatica, right side: Secondary | ICD-10-CM | POA: Diagnosis not present

## 2022-11-01 DIAGNOSIS — G8929 Other chronic pain: Secondary | ICD-10-CM

## 2022-11-01 DIAGNOSIS — M6281 Muscle weakness (generalized): Secondary | ICD-10-CM | POA: Diagnosis not present

## 2022-11-01 DIAGNOSIS — M5459 Other low back pain: Secondary | ICD-10-CM

## 2022-11-01 DIAGNOSIS — R2689 Other abnormalities of gait and mobility: Secondary | ICD-10-CM

## 2022-11-02 ENCOUNTER — Encounter
Payer: BC Managed Care – PPO | Attending: Physical Medicine & Rehabilitation | Admitting: Physical Medicine & Rehabilitation

## 2022-11-02 ENCOUNTER — Encounter: Payer: Self-pay | Admitting: Physical Therapy

## 2022-11-02 ENCOUNTER — Encounter: Payer: Self-pay | Admitting: Physical Medicine & Rehabilitation

## 2022-11-02 VITALS — BP 115/81 | HR 106 | Ht 60.0 in | Wt 154.0 lb

## 2022-11-02 DIAGNOSIS — M5441 Lumbago with sciatica, right side: Secondary | ICD-10-CM | POA: Insufficient documentation

## 2022-11-02 DIAGNOSIS — M797 Fibromyalgia: Secondary | ICD-10-CM | POA: Diagnosis not present

## 2022-11-02 DIAGNOSIS — G8929 Other chronic pain: Secondary | ICD-10-CM | POA: Insufficient documentation

## 2022-11-02 DIAGNOSIS — G894 Chronic pain syndrome: Secondary | ICD-10-CM | POA: Diagnosis not present

## 2022-11-02 DIAGNOSIS — D573 Sickle-cell trait: Secondary | ICD-10-CM | POA: Insufficient documentation

## 2022-11-02 MED ORDER — TRAMADOL HCL 50 MG PO TABS: 50.0000 mg | ORAL_TABLET | Freq: Four times a day (QID) | ORAL | 0 refills | Status: DC | PRN
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
## 2022-11-03 DIAGNOSIS — M797 Fibromyalgia: Secondary | ICD-10-CM | POA: Diagnosis not present

## 2022-11-03 DIAGNOSIS — M069 Rheumatoid arthritis, unspecified: Secondary | ICD-10-CM | POA: Diagnosis not present

## 2022-11-03 DIAGNOSIS — M5441 Lumbago with sciatica, right side: Secondary | ICD-10-CM | POA: Diagnosis not present

## 2022-11-04 DIAGNOSIS — M797 Fibromyalgia: Secondary | ICD-10-CM | POA: Diagnosis not present

## 2022-11-04 DIAGNOSIS — M5441 Lumbago with sciatica, right side: Secondary | ICD-10-CM | POA: Diagnosis not present

## 2022-11-04 DIAGNOSIS — M069 Rheumatoid arthritis, unspecified: Secondary | ICD-10-CM | POA: Diagnosis not present

## 2022-11-05 ENCOUNTER — Encounter: Payer: Self-pay | Admitting: Physical Therapy

## 2022-11-05 ENCOUNTER — Ambulatory Visit: Payer: BC Managed Care – PPO | Attending: Physical Medicine & Rehabilitation | Admitting: Physical Therapy

## 2022-11-05 DIAGNOSIS — R252 Cramp and spasm: Secondary | ICD-10-CM | POA: Insufficient documentation

## 2022-11-05 DIAGNOSIS — R1084 Generalized abdominal pain: Secondary | ICD-10-CM | POA: Insufficient documentation

## 2022-11-05 DIAGNOSIS — R102 Pelvic and perineal pain: Secondary | ICD-10-CM | POA: Diagnosis not present
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
## 2022-11-07 ENCOUNTER — Ambulatory Visit: Payer: BC Managed Care – PPO | Admitting: Physical Therapy

## 2022-11-07 DIAGNOSIS — R2689 Other abnormalities of gait and mobility: Secondary | ICD-10-CM | POA: Diagnosis not present

## 2022-11-07 DIAGNOSIS — M5441 Lumbago with sciatica, right side: Secondary | ICD-10-CM | POA: Diagnosis not present

## 2022-11-07 DIAGNOSIS — M6281 Muscle weakness (generalized): Secondary | ICD-10-CM

## 2022-11-07 DIAGNOSIS — M797 Fibromyalgia: Secondary | ICD-10-CM

## 2022-11-07 DIAGNOSIS — M5459 Other low back pain: Secondary | ICD-10-CM

## 2022-11-07 DIAGNOSIS — G8929 Other chronic pain: Secondary | ICD-10-CM | POA: Diagnosis not present
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
## 2022-11-11 ENCOUNTER — Emergency Department (HOSPITAL_BASED_OUTPATIENT_CLINIC_OR_DEPARTMENT_OTHER): Payer: BC Managed Care – PPO | Admitting: Radiology

## 2022-11-11 ENCOUNTER — Encounter (HOSPITAL_BASED_OUTPATIENT_CLINIC_OR_DEPARTMENT_OTHER): Payer: Self-pay | Admitting: Emergency Medicine

## 2022-11-11 ENCOUNTER — Other Ambulatory Visit: Payer: Self-pay

## 2022-11-11 ENCOUNTER — Emergency Department (HOSPITAL_BASED_OUTPATIENT_CLINIC_OR_DEPARTMENT_OTHER)
Admission: EM | Admit: 2022-11-11 | Discharge: 2022-11-11 | Disposition: A | Discharge status: Home / Self Care | Payer: BC Managed Care – PPO | Attending: Emergency Medicine | Admitting: Emergency Medicine

## 2022-11-11 DIAGNOSIS — M25512 Pain in left shoulder: Secondary | ICD-10-CM | POA: Insufficient documentation

## 2022-11-11 DIAGNOSIS — M50322 Other cervical disc degeneration at C5-C6 level: Secondary | ICD-10-CM | POA: Diagnosis not present

## 2022-11-11 DIAGNOSIS — Z21 Asymptomatic human immunodeficiency virus [HIV] infection status: Secondary | ICD-10-CM | POA: Insufficient documentation

## 2022-11-11 DIAGNOSIS — R200 Anesthesia of skin: Secondary | ICD-10-CM | POA: Insufficient documentation

## 2022-11-11 DIAGNOSIS — Z91040 Latex allergy status: Secondary | ICD-10-CM | POA: Diagnosis not present

## 2022-11-11 DIAGNOSIS — M542 Cervicalgia: Secondary | ICD-10-CM | POA: Diagnosis not present

## 2022-11-11 DIAGNOSIS — Z79899 Other long term (current) drug therapy: Secondary | ICD-10-CM | POA: Insufficient documentation

## 2022-11-11 DIAGNOSIS — M5412 Radiculopathy, cervical region: Secondary | ICD-10-CM

## 2022-11-11 MED ORDER — KETOROLAC TROMETHAMINE 60 MG/2ML IM SOLN
60.0000 mg | Freq: Once | INTRAMUSCULAR | Status: AC
  Administered 2022-11-11: 60 mg via INTRAMUSCULAR
  Filled 2022-11-11: qty 2

## 2022-11-11 MED ORDER — HYDROCODONE-ACETAMINOPHEN 5-325 MG PO TABS: 1.0000 | ORAL_TABLET | Freq: Four times a day (QID) | ORAL | 0 refills | Status: DC | PRN

## 2022-11-11 MED ORDER — PREDNISONE 20 MG PO TABS
40.0000 mg | ORAL_TABLET | Freq: Once | ORAL | Status: AC
  Administered 2022-11-11: 40 mg via ORAL
  Filled 2022-11-11: qty 2

## 2022-11-11 MED ORDER — PREDNISONE 10 MG PO TABS: 20.0000 mg | ORAL_TABLET | Freq: Two times a day (BID) | ORAL | 0 refills | Status: DC

## 2022-11-11 MED ORDER — HYDROMORPHONE HCL 1 MG/ML IJ SOLN
2.0000 mg | Freq: Once | INTRAMUSCULAR | Status: AC
  Administered 2022-11-11: 2 mg via INTRAMUSCULAR
  Filled 2022-11-11: qty 2

## 2022-11-11 NOTE — Discharge Instructions (Signed)
Begin taking prednisone as prescribed.  Begin taking hydrocodone as prescribed as needed for pain.  Follow-up with your primary doctor if not improving in the next week.

## 2022-11-11 NOTE — ED Provider Notes (Signed)
[place] EMERGENCY DEPARTMENT AT [facility] Provider Note   CSN: [id number] Arrival date & time: 11/11/22  0424     History  Chief Complaint  Patient presents with   Neck Pain   Shoulder Pain    Tina Huber is a 47 y.o. female.  Patient is a 47 year old female with history of sickle cell trait, chronic pain syndrome, HIV disease, polycystic ovaries, hyperlipidemia.  Patient presenting today for evaluation of neck pain.  Her pain began 2 weeks ago in the absence of any injury or trauma.  She describes severe pain to the left side of her neck that radiates into her left shoulder and left arm.  At times she describes her left arm feeling numb.  She denies any weakness.  She has seen her primary doctor who has arranged for physical therapy and prescribed medications, but these do not seem to be helping.  The history is provided by the patient.       Home Medications Prior to Admission medications   Medication Sig Start Date End Date Taking? Authorizing Provider  acetaminophen (TYLENOL) 500 MG tablet Take by mouth every 6 (six) hours as needed for mild pain.    [provider]  albuterol (PROVENTIL) (2.5 MG/3ML) 0.083% nebulizer solution Take 3 mLs (2.5 mg total) by nebulization every 4 (four) hours as needed for wheezing or shortness of breath. 07/20/20 09/20/22  Barbette Merino, NP  albuterol (VENTOLIN HFA) 108 (90 Base) MCG/ACT inhaler INHALE 2 PUFFS BY MOUTH EVERY 6 HOURS AS NEEDED FOR WHEEZING FOR SHORTNESS OF BREATH 03/23/22   Ivonne Andrew, NP  atorvastatin (LIPITOR) 10 MG tablet Take 1 tablet (10 mg total) by mouth daily. 05/24/22 11/20/22  Ivonne Andrew, NP  celecoxib (CELEBREX) 200 MG capsule Take 200 mg by mouth 2 (two) times daily. 06/10/20   [provider]  cholecalciferol (VITAMIN D3) 25 MCG (1000 UNIT) tablet Take 1,000 Units by mouth daily.    [provider]  cyclobenzaprine (FLEXERIL) 10 MG tablet Take 1 tablet (10 mg total) by  mouth at bedtime. 05/24/22   Ivonne Andrew, NP  diclofenac Sodium (VOLTAREN ARTHRITIS PAIN) 1 % GEL Apply 2 g topically 2 (two) times daily as needed (pain). 01/03/22   Ivonne Andrew, NP  dicyclomine (BENTYL) 10 MG capsule Take 1 capsule (10 mg total) by mouth 4 (four) times daily -  before meals and at bedtime. 04/13/22   Tressia Danas, MD  folic acid (FOLVITE) 1 MG tablet Take 1 mg by mouth daily.    [provider]  Lidocaine 4 % PTCH Apply 1 patch topically daily. 02/17/21   Orion Crook I, NP  metoprolol tartrate (LOPRESSOR) 25 MG tablet Take 1/2 (one-half) tablet by mouth twice daily 05/24/22   Ivonne Andrew, NP  Multiple Vitamins-Minerals (MULTIVITAMIN WITH MINERALS) tablet Take 1 tablet by mouth daily.    [provider]  omeprazole (PRILOSEC) 40 MG capsule Take 1 capsule (40 mg total) by mouth 2 (two) times daily. 04/13/22   Tressia Danas, MD  ondansetron (ZOFRAN) 4 MG tablet Take 1 tablet (4 mg total) by mouth daily as needed for nausea or vomiting. 05/24/22 05/24/23  Ivonne Andrew, NP  polyethylene glycol (MIRALAX / GLYCOLAX) 17 g packet Take 17 g by mouth daily. 07/31/21   Burnadette Pop, MD  predniSONE (DELTASONE) 10 MG tablet Take 4 tabs for 2 days, then 3 tabs for 2 days, then 2 tabs for 2 days, then 1 tab for  2 days, then stop 09/20/22   Ivonne Andrew, NP  pregabalin (LYRICA) 100 MG capsule Take 1 capsule (100 mg total) by mouth 2 (two) times daily. 06/29/22   Fanny Dance, MD  senna (SENOKOT) 8.6 MG TABS tablet Take 1 tablet (8.6 mg total) by mouth 2 (two) times daily. 07/31/21   Burnadette Pop, MD  traMADol (ULTRAM) 50 MG tablet Take 1 tablet (50 mg total) by mouth every 6 (six) hours as needed. 11/02/22   Fanny Dance, MD      Allergies    Latex, Quinine, and Quinine derivatives    Review of Systems   Review of Systems  All other systems reviewed and are negative.   Physical Exam Updated Vital Signs BP (!) 137/101 (BP Location:  Right Arm)   Pulse 97   Temp 98.2 F (36.8 C) (Oral)   Resp 18   Ht 5' (1.524 m)   Wt 69.9 kg   SpO2 97%   BMI 30.08 kg/m  Physical Exam Vitals and nursing note reviewed.  Constitutional:      Appearance: Normal appearance.  Pulmonary:     Effort: Pulmonary effort is normal.  Musculoskeletal:     Comments: The neck appears grossly normal.  There is tenderness in the soft tissues of the left side of the neck extending into the trapezius.  She has good range of motion of the shoulder.  Patient is able to flex, extend, and oppose all fingers.  Motor and sensory are intact throughout the entire hand.  Ulnar and radial pulses are easily palpable.  Skin:    General: Skin is warm and dry.  Neurological:     Mental Status: She is alert.     ED Results / Procedures / Treatments   Labs (all labs ordered are listed, but only abnormal results are displayed) Labs Reviewed - No data to display  EKG None  Radiology No results found.  Procedures Procedures    Medications Ordered in ED Medications  HYDROmorphone (DILAUDID) injection 2 mg (has no administration in time range)  ketorolac (TORADOL) injection 60 mg (has no administration in time range)  predniSONE (DELTASONE) tablet 40 mg (has no administration in time range)    ED Course/ Medical Decision Making/ A&P  Patient presenting with complaints of nontraumatic neck and left shoulder pain worsening over the past month.  She has been to physical therapy and taking tramadol, but not having any relief.  Patient arrives with stable vital signs and is afebrile.  Her physical examination is basically unremarkable.  She has tenderness to palpation in the soft tissues of the lateral neck and trapezius muscle, but left arm is neurologically intact.  X-rays obtained of the cervical spine showing no acute process.  At this point, I am uncertain as to the exact etiology of her discomfort.  Perhaps she has a pinched nerve or disc issue.  I  will treat with prednisone, pain medication, and have her follow-up with primary doctor if not improving.  Perhaps an MRI would be in order.  Final Clinical Impression(s) / ED Diagnoses Final diagnoses:  None    Rx / DC Orders ED Discharge Orders     None         Geoffery Lyons, MD 11/11/22 [phone]

## 2022-11-11 NOTE — ED Triage Notes (Signed)
  Patient comes in with neck and L shoulder pain that has been going on for the past 2 weeks.  Patient states she thought she slept on it wrong but it has not gotten better.  Hx of sickle cell disease type SS.  Has tried taking tramadol, and tylenol at home for pain relief.  No known injury.  Pain 10/10, sharp/aching.

## 2022-11-12 ENCOUNTER — Ambulatory Visit: Payer: BC Managed Care – PPO | Admitting: Physical Therapy

## 2022-11-12 ENCOUNTER — Encounter: Payer: Self-pay | Admitting: Physical Therapy

## 2022-11-12 DIAGNOSIS — Z79899 Other long term (current) drug therapy: Secondary | ICD-10-CM | POA: Diagnosis not present

## 2022-11-12 DIAGNOSIS — R252 Cramp and spasm: Secondary | ICD-10-CM

## 2022-11-12 DIAGNOSIS — M1991 Primary osteoarthritis, unspecified site: Secondary | ICD-10-CM | POA: Diagnosis not present

## 2022-11-12 DIAGNOSIS — G894 Chronic pain syndrome: Secondary | ICD-10-CM | POA: Diagnosis not present

## 2022-11-12 DIAGNOSIS — M79642 Pain in left hand: Secondary | ICD-10-CM | POA: Diagnosis not present

## 2022-11-12 DIAGNOSIS — R1084 Generalized abdominal pain: Secondary | ICD-10-CM

## 2022-11-12 DIAGNOSIS — M79641 Pain in right hand: Secondary | ICD-10-CM | POA: Diagnosis not present

## 2022-11-12 DIAGNOSIS — R102 Pelvic and perineal pain: Secondary | ICD-10-CM
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
## 2022-11-15 ENCOUNTER — Ambulatory Visit: Payer: BC Managed Care – PPO | Admitting: Physical Therapy

## 2022-11-16 ENCOUNTER — Ambulatory Visit: Payer: BC Managed Care – PPO | Admitting: Physical Therapy
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
## 2022-11-20 NOTE — Telephone Encounter (Signed)
Spoke to pt and advise that forms were sent with documentation of conditions describe in the form. In Past medical history was the dx of HIV. Pt was advised that the note are not changed before sent. Pt stated she was ok with this and she rescheduled her appointment. KH

## 2022-11-21 ENCOUNTER — Ambulatory Visit: Payer: BC Managed Care – PPO | Admitting: Physical Therapy

## 2022-11-21 DIAGNOSIS — M5459 Other low back pain: Secondary | ICD-10-CM | POA: Diagnosis not present

## 2022-11-21 DIAGNOSIS — G8929 Other chronic pain: Secondary | ICD-10-CM

## 2022-11-21 DIAGNOSIS — R2689 Other abnormalities of gait and mobility: Secondary | ICD-10-CM

## 2022-11-21 DIAGNOSIS — M6281 Muscle weakness (generalized): Secondary | ICD-10-CM

## 2022-11-21 DIAGNOSIS — M5441 Lumbago with sciatica, right side: Secondary | ICD-10-CM | POA: Diagnosis not present

## 2022-11-21 DIAGNOSIS — M797 Fibromyalgia: Secondary | ICD-10-CM
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
## 2022-11-22 ENCOUNTER — Ambulatory Visit: Payer: Self-pay | Admitting: Nurse Practitioner

## 2022-11-22 ENCOUNTER — Ambulatory Visit: Payer: BC Managed Care – PPO | Admitting: Physical Therapy

## 2022-11-23 ENCOUNTER — Encounter: Payer: Self-pay | Admitting: Nurse Practitioner

## 2022-11-23 ENCOUNTER — Ambulatory Visit: Payer: BC Managed Care – PPO | Admitting: Nurse Practitioner

## 2022-11-23 VITALS — BP 116/84 | HR 100 | Temp 98.5°F | Ht 60.0 in | Wt 159.4 lb

## 2022-11-23 DIAGNOSIS — G8929 Other chronic pain: Secondary | ICD-10-CM

## 2022-11-23 DIAGNOSIS — M25512 Pain in left shoulder: Secondary | ICD-10-CM
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
## 2022-11-23 NOTE — Progress Notes (Unsigned)
Patient asking for MRI on left arm.  Patient wants Flu vaccine

## 2022-11-23 NOTE — Patient Instructions (Addendum)
1. Chronic left shoulder pain  - AMB referral to orthopedics   Follow up:  Follow up in 3 months

## 2022-11-27 ENCOUNTER — Ambulatory Visit: Payer: BC Managed Care – PPO | Admitting: Rehabilitation

## 2022-11-27 ENCOUNTER — Encounter: Payer: Self-pay | Admitting: Rehabilitation

## 2022-11-27 DIAGNOSIS — G8929 Other chronic pain: Secondary | ICD-10-CM

## 2022-11-27 DIAGNOSIS — M797 Fibromyalgia: Secondary | ICD-10-CM | POA: Diagnosis not present

## 2022-11-27 DIAGNOSIS — M5441 Lumbago with sciatica, right side: Secondary | ICD-10-CM | POA: Diagnosis not present

## 2022-11-27 DIAGNOSIS — M5459 Other low back pain: Secondary | ICD-10-CM | POA: Diagnosis not present

## 2022-11-27 DIAGNOSIS — M6281 Muscle weakness (generalized): Secondary | ICD-10-CM | POA: Diagnosis not present

## 2022-11-27 DIAGNOSIS — R2689 Other abnormalities of gait and mobility: Secondary | ICD-10-CM
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
## 2022-11-28 ENCOUNTER — Ambulatory Visit: Payer: BC Managed Care – PPO | Admitting: Physical Therapy

## 2022-11-29 ENCOUNTER — Ambulatory Visit: Payer: BC Managed Care – PPO | Admitting: Physical Therapy

## 2022-12-03 ENCOUNTER — Encounter: Payer: Self-pay | Admitting: Physical Medicine & Rehabilitation

## 2022-12-04 ENCOUNTER — Encounter: Payer: Self-pay | Admitting: Rehabilitation

## 2022-12-04 ENCOUNTER — Ambulatory Visit: Payer: BC Managed Care – PPO | Attending: Nurse Practitioner | Admitting: Rehabilitation

## 2022-12-04 DIAGNOSIS — M6281 Muscle weakness (generalized): Secondary | ICD-10-CM | POA: Insufficient documentation

## 2022-12-04 DIAGNOSIS — M069 Rheumatoid arthritis, unspecified: Secondary | ICD-10-CM | POA: Diagnosis not present

## 2022-12-04 DIAGNOSIS — M5441 Lumbago with sciatica, right side: Secondary | ICD-10-CM | POA: Insufficient documentation

## 2022-12-04 DIAGNOSIS — G8929 Other chronic pain: Secondary | ICD-10-CM | POA: Diagnosis not present

## 2022-12-04 DIAGNOSIS — R2689 Other abnormalities of gait and mobility: Secondary | ICD-10-CM | POA: Diagnosis not present

## 2022-12-04 DIAGNOSIS — M797 Fibromyalgia: Secondary | ICD-10-CM | POA: Diagnosis not present
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
## 2022-12-05 ENCOUNTER — Ambulatory Visit: Payer: BC Managed Care – PPO | Admitting: Physical Therapy

## 2022-12-05 DIAGNOSIS — M5441 Lumbago with sciatica, right side: Secondary | ICD-10-CM | POA: Diagnosis not present

## 2022-12-05 DIAGNOSIS — M069 Rheumatoid arthritis, unspecified: Secondary | ICD-10-CM | POA: Diagnosis not present

## 2022-12-05 DIAGNOSIS — M797 Fibromyalgia: Secondary | ICD-10-CM | POA: Diagnosis not present

## 2022-12-06 ENCOUNTER — Ambulatory Visit: Payer: BC Managed Care – PPO | Admitting: Physical Therapy

## 2022-12-12 ENCOUNTER — Ambulatory Visit: Payer: BC Managed Care – PPO | Admitting: Physical Therapy

## 2022-12-13 ENCOUNTER — Telehealth: Payer: Self-pay | Admitting: Physical Therapy

## 2022-12-14 ENCOUNTER — Ambulatory Visit: Payer: BC Managed Care – PPO | Admitting: Physician Assistant

## 2022-12-18 ENCOUNTER — Ambulatory Visit: Payer: BC Managed Care – PPO | Admitting: Rehabilitation

## 2022-12-19 ENCOUNTER — Ambulatory Visit: Payer: BC Managed Care – PPO | Admitting: Physical Therapy

## 2022-12-19 ENCOUNTER — Ambulatory Visit (INDEPENDENT_AMBULATORY_CARE_PROVIDER_SITE_OTHER): Payer: BC Managed Care – PPO | Admitting: Physician Assistant

## 2022-12-19 ENCOUNTER — Encounter: Payer: Self-pay | Admitting: Physician Assistant

## 2022-12-19 DIAGNOSIS — M5412 Radiculopathy, cervical region: Secondary | ICD-10-CM

## 2022-12-19 DIAGNOSIS — M79602 Pain in left arm: Secondary | ICD-10-CM | POA: Diagnosis not present
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
## 2022-12-20 ENCOUNTER — Encounter: Payer: Self-pay | Admitting: Physician Assistant

## 2022-12-26 ENCOUNTER — Ambulatory Visit: Payer: BC Managed Care – PPO | Admitting: Physical Therapy

## 2022-12-29 ENCOUNTER — Ambulatory Visit
Admission: RE | Admit: 2022-12-29 | Discharge: 2022-12-29 | Disposition: A | Discharge status: Home / Self Care | Payer: BC Managed Care – PPO | Source: Ambulatory Visit | Attending: Physician Assistant | Admitting: Physician Assistant

## 2022-12-29 DIAGNOSIS — M5412 Radiculopathy, cervical region: Secondary | ICD-10-CM

## 2022-12-29 DIAGNOSIS — M50222 Other cervical disc displacement at C5-C6 level: Secondary | ICD-10-CM | POA: Diagnosis not present

## 2022-12-29 DIAGNOSIS — M79602 Pain in left arm: Secondary | ICD-10-CM

## 2022-12-29 DIAGNOSIS — M50223 Other cervical disc displacement at C6-C7 level: Secondary | ICD-10-CM | POA: Diagnosis not present

## 2022-12-31 ENCOUNTER — Ambulatory Visit: Payer: BC Managed Care – PPO | Attending: Nurse Practitioner | Admitting: Physical Therapy

## 2022-12-31 DIAGNOSIS — G8929 Other chronic pain: Secondary | ICD-10-CM | POA: Insufficient documentation

## 2022-12-31 DIAGNOSIS — M6281 Muscle weakness (generalized): Secondary | ICD-10-CM | POA: Diagnosis not present

## 2022-12-31 DIAGNOSIS — M797 Fibromyalgia: Secondary | ICD-10-CM | POA: Diagnosis not present

## 2022-12-31 DIAGNOSIS — M5441 Lumbago with sciatica, right side: Secondary | ICD-10-CM | POA: Diagnosis not present

## 2022-12-31 DIAGNOSIS — R2689 Other abnormalities of gait and mobility: Secondary | ICD-10-CM | POA: Diagnosis not present
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
## 2023-01-01 NOTE — Addendum Note (Signed)
Addended by: Peter Congo on: 01/01/2023 08:57 AM   Modules accepted: Orders

## 2023-01-03 DIAGNOSIS — M069 Rheumatoid arthritis, unspecified: Secondary | ICD-10-CM | POA: Diagnosis not present

## 2023-01-03 DIAGNOSIS — M797 Fibromyalgia: Secondary | ICD-10-CM | POA: Diagnosis not present

## 2023-01-03 DIAGNOSIS — M5441 Lumbago with sciatica, right side: Secondary | ICD-10-CM | POA: Diagnosis not present

## 2023-01-04 ENCOUNTER — Encounter: Payer: Self-pay | Admitting: Physical Medicine & Rehabilitation

## 2023-01-04 ENCOUNTER — Encounter
Payer: BC Managed Care – PPO | Attending: Physical Medicine & Rehabilitation | Admitting: Physical Medicine & Rehabilitation

## 2023-01-04 VITALS — BP 119/86 | HR 96 | Ht 60.0 in | Wt 157.8 lb

## 2023-01-04 DIAGNOSIS — D573 Sickle-cell trait: Secondary | ICD-10-CM

## 2023-01-04 DIAGNOSIS — M542 Cervicalgia: Secondary | ICD-10-CM | POA: Insufficient documentation

## 2023-01-04 DIAGNOSIS — G8929 Other chronic pain: Secondary | ICD-10-CM

## 2023-01-04 DIAGNOSIS — G894 Chronic pain syndrome: Secondary | ICD-10-CM

## 2023-01-04 DIAGNOSIS — M5441 Lumbago with sciatica, right side: Secondary | ICD-10-CM | POA: Diagnosis not present

## 2023-01-04 DIAGNOSIS — M797 Fibromyalgia: Secondary | ICD-10-CM | POA: Diagnosis not present

## 2023-01-04 DIAGNOSIS — M069 Rheumatoid arthritis, unspecified: Secondary | ICD-10-CM | POA: Diagnosis not present

## 2023-01-04 MED ORDER — TRAMADOL HCL 50 MG PO TABS: 50.0000 mg | ORAL_TABLET | Freq: Four times a day (QID) | ORAL | 0 refills | Status: DC | PRN
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
## 2023-01-05 ENCOUNTER — Other Ambulatory Visit: Payer: Self-pay | Admitting: Physical Medicine & Rehabilitation

## 2023-01-07 ENCOUNTER — Ambulatory Visit: Payer: BC Managed Care – PPO | Admitting: Physical Medicine and Rehabilitation

## 2023-01-09 ENCOUNTER — Encounter: Payer: Self-pay | Admitting: Physical Medicine and Rehabilitation

## 2023-01-09 ENCOUNTER — Ambulatory Visit: Payer: BC Managed Care – PPO | Admitting: Physical Medicine and Rehabilitation

## 2023-01-09 DIAGNOSIS — M5412 Radiculopathy, cervical region: Secondary | ICD-10-CM | POA: Diagnosis not present

## 2023-01-09 DIAGNOSIS — M4802 Spinal stenosis, cervical region: Secondary | ICD-10-CM

## 2023-01-09 DIAGNOSIS — G894 Chronic pain syndrome: Secondary | ICD-10-CM | POA: Diagnosis not present

## 2023-01-09 DIAGNOSIS — Z862 Personal history of diseases of the blood and blood-forming organs and certain disorders involving the immune mechanism: Secondary | ICD-10-CM

## 2023-01-09 DIAGNOSIS — M542 Cervicalgia: Secondary | ICD-10-CM | POA: Diagnosis not present

## 2023-01-09 DIAGNOSIS — G8929 Other chronic pain: Secondary | ICD-10-CM
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
## 2023-02-03 DIAGNOSIS — M797 Fibromyalgia: Secondary | ICD-10-CM | POA: Diagnosis not present

## 2023-02-03 DIAGNOSIS — M069 Rheumatoid arthritis, unspecified: Secondary | ICD-10-CM | POA: Diagnosis not present

## 2023-02-03 DIAGNOSIS — M5441 Lumbago with sciatica, right side: Secondary | ICD-10-CM | POA: Diagnosis not present

## 2023-02-12 ENCOUNTER — Other Ambulatory Visit: Payer: Self-pay | Admitting: Internal Medicine

## 2023-02-12 DIAGNOSIS — Z21 Asymptomatic human immunodeficiency virus [HIV] infection status: Secondary | ICD-10-CM

## 2023-02-13 ENCOUNTER — Other Ambulatory Visit: Payer: BC Managed Care – PPO

## 2023-02-19 ENCOUNTER — Emergency Department (HOSPITAL_BASED_OUTPATIENT_CLINIC_OR_DEPARTMENT_OTHER)
Admission: EM | Admit: 2023-02-19 | Discharge: 2023-02-19 | Disposition: A | Discharge status: Home / Self Care | Payer: BC Managed Care – PPO | Attending: Emergency Medicine | Admitting: Emergency Medicine

## 2023-02-19 ENCOUNTER — Other Ambulatory Visit: Payer: Self-pay

## 2023-02-19 ENCOUNTER — Encounter (HOSPITAL_BASED_OUTPATIENT_CLINIC_OR_DEPARTMENT_OTHER): Payer: Self-pay | Admitting: Emergency Medicine

## 2023-02-19 ENCOUNTER — Emergency Department (HOSPITAL_BASED_OUTPATIENT_CLINIC_OR_DEPARTMENT_OTHER): Payer: BC Managed Care – PPO

## 2023-02-19 DIAGNOSIS — R1032 Left lower quadrant pain: Secondary | ICD-10-CM | POA: Insufficient documentation

## 2023-02-19 DIAGNOSIS — Z20822 Contact with and (suspected) exposure to covid-19: Secondary | ICD-10-CM | POA: Diagnosis not present

## 2023-02-19 DIAGNOSIS — Z91040 Latex allergy status: Secondary | ICD-10-CM | POA: Diagnosis not present

## 2023-02-19 DIAGNOSIS — K529 Noninfective gastroenteritis and colitis, unspecified: Secondary | ICD-10-CM

## 2023-02-19 LAB — URINALYSIS, ROUTINE W REFLEX MICROSCOPIC
Bacteria, UA: NONE SEEN
Bilirubin Urine: NEGATIVE
Glucose, UA: NEGATIVE mg/dL
Hgb urine dipstick: NEGATIVE
Ketones, ur: NEGATIVE mg/dL
Leukocytes,Ua: NEGATIVE
Nitrite: NEGATIVE
Protein, ur: NEGATIVE mg/dL
Specific Gravity, Urine: 1.017 (ref 1.005–1.030)
pH: 5 (ref 5.0–8.0)

## 2023-02-19 LAB — CBC WITH DIFFERENTIAL/PLATELET
Abs Immature Granulocytes: 0.01 10*3/uL (ref 0.00–0.07)
Basophils Absolute: 0 10*3/uL (ref 0.0–0.1)
Basophils Relative: 0 %
Eosinophils Absolute: 0.1 10*3/uL (ref 0.0–0.5)
Eosinophils Relative: 3 %
HCT: 36.9 % (ref 36.0–46.0)
Hemoglobin: 13 g/dL (ref 12.0–15.0)
Immature Granulocytes: 0 %
Lymphocytes Relative: 62 %
Lymphs Abs: 3 10*3/uL (ref 0.7–4.0)
MCH: 29.1 pg (ref 26.0–34.0)
MCHC: 35.2 g/dL (ref 30.0–36.0)
MCV: 82.7 fL (ref 80.0–100.0)
Monocytes Absolute: 0.5 10*3/uL (ref 0.1–1.0)
Monocytes Relative: 10 %
Neutro Abs: 1.2 10*3/uL — ABNORMAL LOW (ref 1.7–7.7)
Neutrophils Relative %: 25 %
Platelets: 272 10*3/uL (ref 150–400)
RBC: 4.46 MIL/uL (ref 3.87–5.11)
RDW: 13.8 % (ref 11.5–15.5)
WBC: 4.9 10*3/uL (ref 4.0–10.5)
nRBC: 0 % (ref 0.0–0.2)

## 2023-02-19 LAB — COMPREHENSIVE METABOLIC PANEL
ALT: 20 U/L (ref 0–44)
AST: 22 U/L (ref 15–41)
Albumin: 4.2 g/dL (ref 3.5–5.0)
Alkaline Phosphatase: 60 U/L (ref 38–126)
Anion gap: 7 (ref 5–15)
BUN: 14 mg/dL (ref 6–20)
CO2: 26 mmol/L (ref 22–32)
Calcium: 9.1 mg/dL (ref 8.9–10.3)
Chloride: 102 mmol/L (ref 98–111)
Creatinine, Ser: 0.64 mg/dL (ref 0.44–1.00)
GFR, Estimated: >60 mL/min (ref >60)
Glucose, Bld: 94 mg/dL (ref 70–99)
Potassium: 3.6 mmol/L (ref 3.5–5.1)
Sodium: 135 mmol/L (ref 135–145)
Total Bilirubin: 0.6 mg/dL (ref 0.0–1.2)
Total Protein: 7.7 g/dL (ref 6.5–8.1)

## 2023-02-19 LAB — RESP PANEL BY RT-PCR (RSV, FLU A&B, COVID)  RVPGX2
Influenza A by PCR: NEGATIVE
Influenza B by PCR: NEGATIVE
Resp Syncytial Virus by PCR: NEGATIVE
SARS Coronavirus 2 by RT PCR: NEGATIVE

## 2023-02-19 LAB — PREGNANCY, URINE: Preg Test, Ur: NEGATIVE

## 2023-02-19 MED ORDER — HALOPERIDOL LACTATE 5 MG/ML IJ SOLN
5.0000 mg | Freq: Once | INTRAMUSCULAR | Status: AC
  Administered 2023-02-19: 5 mg via INTRAVENOUS
  Filled 2023-02-19: qty 1

## 2023-02-19 MED ORDER — IOHEXOL 300 MG/ML  SOLN
100.0000 mL | Freq: Once | INTRAMUSCULAR | Status: AC | PRN
  Administered 2023-02-19: 100 mL via INTRAVENOUS

## 2023-02-19 MED ORDER — AMOXICILLIN-POT CLAVULANATE 875-125 MG PO TABS
1.0000 | ORAL_TABLET | Freq: Once | ORAL | Status: AC
  Administered 2023-02-19: 1 via ORAL
  Filled 2023-02-19: qty 1

## 2023-02-19 MED ORDER — KETOROLAC TROMETHAMINE 30 MG/ML IJ SOLN
30.0000 mg | Freq: Once | INTRAMUSCULAR | Status: AC
  Administered 2023-02-19: 30 mg via INTRAVENOUS
  Filled 2023-02-19: qty 1

## 2023-02-19 MED ORDER — AMOXICILLIN-POT CLAVULANATE 875-125 MG PO TABS: 1.0000 | ORAL_TABLET | Freq: Two times a day (BID) | ORAL | 0 refills | Status: DC

## 2023-02-19 MED ORDER — FENTANYL CITRATE PF 50 MCG/ML IJ SOSY
50.0000 ug | PREFILLED_SYRINGE | Freq: Once | INTRAMUSCULAR | Status: AC
  Administered 2023-02-19: 50 ug via INTRAVENOUS
  Filled 2023-02-19 (×2): qty 1

## 2023-02-19 NOTE — ED Notes (Signed)
ED Provider at bedside. 

## 2023-02-19 NOTE — ED Triage Notes (Signed)
Patient presents with abd pain and left lower leg pain since Sunday. Patient reports taking tramadol and lyrica with no relief

## 2023-02-19 NOTE — ED Provider Notes (Addendum)
[place] EMERGENCY DEPARTMENT AT [facility] Provider Note   CSN: [id number] Arrival date & time: 02/19/23  0443     History  Chief Complaint  Patient presents with   Abdominal Pain    Tina Huber is a 48 y.o. female.  The history is provided by the patient.  Abdominal Pain Pain location:  LLQ and RLQ Pain quality: aching   Pain radiates to:  Does not radiate Pain severity:  Severe Onset quality:  Gradual Duration:  3 days Timing:  Constant Progression:  Unchanged Chronicity:  New Context: not eating, not laxative use, not retching, not sick contacts, not suspicious food intake and not trauma   Relieved by:  Nothing Worsened by:  Nothing Ineffective treatments:  None tried Associated symptoms: no constipation, no diarrhea, no fever, no nausea, no vaginal bleeding and no vomiting   Risk factors: no alcohol abuse   Patient with HIV and chronic pain syndrome presents with lower abdominal pain and leg pain.  No fevers, no urinary complaints, no n/v/d.       Home Medications Prior to Admission medications   Medication Sig Start Date End Date Taking? Authorizing Provider  acetaminophen (TYLENOL) 500 MG tablet Take by mouth every 6 (six) hours as needed for mild pain.    [provider]  albuterol (PROVENTIL) (2.5 MG/3ML) 0.083% nebulizer solution Take 3 mLs (2.5 mg total) by nebulization every 4 (four) hours as needed for wheezing or shortness of breath. 07/20/20 11/23/22  Barbette Merino, NP  albuterol (VENTOLIN HFA) 108 (90 Base) MCG/ACT inhaler INHALE 2 PUFFS BY MOUTH EVERY 6 HOURS AS NEEDED FOR WHEEZING FOR SHORTNESS OF BREATH 03/23/22   Ivonne Andrew, NP  atorvastatin (LIPITOR) 10 MG tablet Take 1 tablet (10 mg total) by mouth daily. 05/24/22 11/23/22  Ivonne Andrew, NP  celecoxib (CELEBREX) 200 MG capsule Take 200 mg by mouth 2 (two) times daily. 06/10/20   [provider]  cholecalciferol (VITAMIN D3) 25 MCG (1000 UNIT) tablet Take 1,000  Units by mouth daily.    [provider]  cyclobenzaprine (FLEXERIL) 10 MG tablet Take 1 tablet (10 mg total) by mouth at bedtime. 05/24/22   Ivonne Andrew, NP  diclofenac Sodium (VOLTAREN ARTHRITIS PAIN) 1 % GEL Apply 2 g topically 2 (two) times daily as needed (pain). 01/03/22   Ivonne Andrew, NP  dicyclomine (BENTYL) 10 MG capsule Take 1 capsule (10 mg total) by mouth 4 (four) times daily -  before meals and at bedtime. 04/13/22   Tressia Danas, MD  folic acid (FOLVITE) 1 MG tablet Take 1 mg by mouth daily.    [provider]  Lidocaine 4 % PTCH Apply 1 patch topically daily. 02/17/21   Orion Crook I, NP  metoprolol tartrate (LOPRESSOR) 25 MG tablet Take 1/2 (one-half) tablet by mouth twice daily 05/24/22   Ivonne Andrew, NP  Multiple Vitamins-Minerals (MULTIVITAMIN WITH MINERALS) tablet Take 1 tablet by mouth daily.    [provider]  omeprazole (PRILOSEC) 40 MG capsule Take 1 capsule (40 mg total) by mouth 2 (two) times daily. 04/13/22   Tressia Danas, MD  ondansetron (ZOFRAN) 4 MG tablet Take 1 tablet (4 mg total) by mouth daily as needed for nausea or vomiting. 05/24/22 05/24/23  Ivonne Andrew, NP  polyethylene glycol (MIRALAX / GLYCOLAX) 17 g packet Take 17 g by mouth daily. 07/31/21   Burnadette Pop, MD  pregabalin (LYRICA) 100 MG capsule Take 1 capsule by mouth twice daily  01/07/23   Fanny Dance, MD  senna (SENOKOT) 8.6 MG TABS tablet Take 1 tablet (8.6 mg total) by mouth 2 (two) times daily. 07/31/21   Burnadette Pop, MD  traMADol (ULTRAM) 50 MG tablet Take 1-2 tablets (50-100 mg total) by mouth every 6 (six) hours as needed. 01/04/23   Fanny Dance, MD      Allergies    Latex, Quinine, and Quinine derivatives    Review of Systems   Review of Systems  Constitutional:  Negative for fever.  HENT:  Negative for facial swelling.   Cardiovascular:  Negative for leg swelling.  Gastrointestinal:  Positive for abdominal pain. Negative for  constipation, diarrhea, nausea and vomiting.  Genitourinary:  Negative for vaginal bleeding.  Musculoskeletal:  Positive for arthralgias. Negative for neck pain and neck stiffness.  All other systems reviewed and are negative.   Physical Exam Updated Vital Signs BP (!) 114/90   Pulse 80   Temp 98 F (36.7 C) (Oral)   Resp 18   LMP 02/14/2023 (Exact Date)   SpO2 98%  Physical Exam Vitals and nursing note reviewed. Exam conducted with a chaperone present.  Constitutional:      General: She is not in acute distress.    Appearance: Normal appearance. She is well-developed.  HENT:     Head: Normocephalic and atraumatic.     Nose: Nose normal.  Eyes:     Pupils: Pupils are equal, round, and reactive to light.  Cardiovascular:     Rate and Rhythm: Normal rate and regular rhythm.     Pulses: Normal pulses.     Heart sounds: Normal heart sounds.  Pulmonary:     Effort: Pulmonary effort is normal. No respiratory distress.     Breath sounds: Normal breath sounds.  Abdominal:     General: Bowel sounds are normal. There is no distension.     Palpations: Abdomen is soft.     Tenderness: There is no abdominal tenderness. There is no guarding or rebound.  Musculoskeletal:        General: Normal range of motion.     Cervical back: Neck supple.  Skin:    General: Skin is warm and dry.     Capillary Refill: Capillary refill takes less than 2 seconds.     Findings: No erythema or rash.  Neurological:     General: No focal deficit present.     Mental Status: She is alert.     Deep Tendon Reflexes: Reflexes normal.  Psychiatric:        Mood and Affect: Mood normal.     ED Results / Procedures / Treatments   Labs (all labs ordered are listed, but only abnormal results are displayed) Results for orders placed or performed during the hospital encounter of 02/19/23  Resp panel by RT-PCR (RSV, Flu A&B, Covid) Urine, Clean Catch   Collection Time: 02/19/23  5:04 AM   Specimen: Urine,  Clean Catch; Nasal Swab  Result Value Ref Range   SARS Coronavirus 2 by RT PCR NEGATIVE NEGATIVE   Influenza A by PCR NEGATIVE NEGATIVE   Influenza B by PCR NEGATIVE NEGATIVE   Resp Syncytial Virus by PCR NEGATIVE NEGATIVE  Pregnancy, urine   Collection Time: 02/19/23  5:04 AM  Result Value Ref Range   Preg Test, Ur NEGATIVE NEGATIVE  Urinalysis, Routine w reflex microscopic -Urine, Clean Catch   Collection Time: 02/19/23  5:04 AM  Result Value Ref Range   Color, Urine YELLOW YELLOW   APPearance  CLEAR CLEAR   Specific Gravity, Urine 1.017 1.005 - 1.030   pH 5.0 5.0 - 8.0   Glucose, UA NEGATIVE NEGATIVE mg/dL   Hgb urine dipstick NEGATIVE NEGATIVE   Bilirubin Urine NEGATIVE NEGATIVE   Ketones, ur NEGATIVE NEGATIVE mg/dL   Protein, ur NEGATIVE NEGATIVE mg/dL   Nitrite NEGATIVE NEGATIVE   Leukocytes,Ua NEGATIVE NEGATIVE   RBC / HPF 0-5 0 - 5 RBC/hpf   WBC, UA 0-5 0 - 5 WBC/hpf   Bacteria, UA NONE SEEN NONE SEEN   Squamous Epithelial / HPF 0-5 0 - 5 /HPF   Mucus PRESENT   CBC with Differential   Collection Time: 02/19/23  5:26 AM  Result Value Ref Range   WBC 4.9 4.0 - 10.5 K/uL   RBC 4.46 3.87 - 5.11 MIL/uL   Hemoglobin 13.0 12.0 - 15.0 g/dL   HCT 16.1 09.6 - 04.5 %   MCV 82.7 80.0 - 100.0 fL   MCH 29.1 26.0 - 34.0 pg   MCHC 35.2 30.0 - 36.0 g/dL   RDW 40.9 81.1 - 91.4 %   Platelets 272 150 - 400 K/uL   nRBC 0.0 0.0 - 0.2 %   Neutrophils Relative % 25 %   Neutro Abs 1.2 (L) 1.7 - 7.7 K/uL   Lymphocytes Relative 62 %   Lymphs Abs 3.0 0.7 - 4.0 K/uL   Monocytes Relative 10 %   Monocytes Absolute 0.5 0.1 - 1.0 K/uL   Eosinophils Relative 3 %   Eosinophils Absolute 0.1 0.0 - 0.5 K/uL   Basophils Relative 0 %   Basophils Absolute 0.0 0.0 - 0.1 K/uL   Immature Granulocytes 0 %   Abs Immature Granulocytes 0.01 0.00 - 0.07 K/uL  Comprehensive metabolic panel   Collection Time: 02/19/23  5:26 AM  Result Value Ref Range   Sodium 135 135 - 145 mmol/L   Potassium 3.6 3.5 -  5.1 mmol/L   Chloride 102 98 - 111 mmol/L   CO2 26 22 - 32 mmol/L   Glucose, Bld 94 70 - 99 mg/dL   BUN 14 6 - 20 mg/dL   Creatinine, Ser 7.82 0.44 - 1.00 mg/dL   Calcium 9.1 8.9 - 95.6 mg/dL   Total Protein 7.7 6.5 - 8.1 g/dL   Albumin 4.2 3.5 - 5.0 g/dL   AST 22 15 - 41 U/L   ALT 20 0 - 44 U/L   Alkaline Phosphatase 60 38 - 126 U/L   Total Bilirubin 0.6 0.0 - 1.2 mg/dL   GFR, Estimated >21 >30 mL/min   Anion gap 7 5 - 15   No results found.   Radiology No results found.  Procedures Procedures    Medications Ordered in ED Medications  fentaNYL (SUBLIMAZE) injection 50 mcg (50 mcg Intravenous Given 02/19/23 0531)  haloperidol lactate (HALDOL) injection 5 mg (5 mg Intravenous Given 02/19/23 0533)  ketorolac (TORADOL) 30 MG/ML injection 30 mg (30 mg Intravenous Given 02/19/23 0542)  iohexol (OMNIPAQUE) 300 MG/ML solution 100 mL (100 mLs Intravenous Contrast Given 02/19/23 0601)    ED Course/ Medical Decision Making/ A&P                                 Medical Decision Making Patient with lower abdominal pain and leg pain   Amount and/or Complexity of Data Reviewed Independent Historian: spouse    Details: See above  External Data Reviewed: labs and notes.  Details: Patient has sickle cell trait according to [facility] electrophoresis, no sickle cell disease  Labs: ordered.    Details: Pregnancy is negative urine is negative for UTI. Normal white count 4.9, normal hemoglobin 13.8, normal platelets. Norma sodium 135, normal potassium 3.6, normal creatinine Radiology: ordered and independent interpretation performed.    Details: No obstruction by me on CT  Risk Prescription drug management. Risk Details: Well appearing. No acute exam,vital, lab or imaging findings to explain patients symptoms. Give CT findings will start augmentin. As exam and vitals are benign and there is no fever nor diarrhea I doubt this.   Follow up with your pain management specialist for ongoing care.   Stable for discharge with close follow up with GI.     Final Clinical Impression(s) / ED Diagnoses Final diagnoses:  None   Return for intractable cough, coughing up blood, fevers > 100.4 unrelieved by medication, shortness of breath, intractable vomiting, chest pain, shortness of breath, weakness, numbness, changes in speech, facial asymmetry, abdominal pain, passing out, Inability to tolerate liquids or food, cough, altered mental status or any concerns. No signs of systemic illness or infection. The patient is nontoxic-appearing on exam and vital signs are within normal limits.  I have reviewed the triage vital signs and the nursing notes. Pertinent labs & imaging results that were available during my care of the patient were reviewed by me and considered in my medical decision making (see chart for details). After history, exam, and medical workup I feel the patient has been appropriately medically screened and is safe for discharge home. Pertinent diagnoses were discussed with the patient. Patient was given return precautions.  Rx / DC Orders ED Discharge Orders     None        Kierrah Kilbride, MD 02/19/23 1308    Nicanor Alcon, Benigno Check, MD 02/19/23 [id number]

## 2023-02-20 ENCOUNTER — Other Ambulatory Visit: Payer: Self-pay | Admitting: Physical Medicine & Rehabilitation

## 2023-02-25 ENCOUNTER — Ambulatory Visit: Payer: Self-pay | Admitting: Nurse Practitioner

## 2023-02-26 ENCOUNTER — Telehealth: Payer: Self-pay

## 2023-02-26 NOTE — Progress Notes (Signed)
Transition Care Management Unsuccessful Follow-up Telephone Call  Date of discharge and from where:  02/19/2023 [facility]  Attempts:  2nd Attempt  Reason for unsuccessful TCM follow-up call:  Left voice message  Cornelius Schuitema [facility]  [facility] Guide Direct Dial: [phone]  Fax: [phone] Website: [email or web address]

## 2023-02-26 NOTE — Progress Notes (Signed)
Transition Care Management Unsuccessful Follow-up Telephone Call  Date of discharge and from where:  02/19/2023 [facility]  Attempts:  1st Attempt  Reason for unsuccessful TCM follow-up call:  Left voice message  Shayli Altemose [facility]  [facility] Guide Direct Dial: [phone]  Fax: [phone] Website: [email or web address]

## 2023-02-27 ENCOUNTER — Ambulatory Visit: Payer: BC Managed Care – PPO | Admitting: Internal Medicine

## 2023-02-27 ENCOUNTER — Encounter: Payer: Self-pay | Admitting: Internal Medicine

## 2023-02-27 ENCOUNTER — Other Ambulatory Visit: Payer: Self-pay

## 2023-02-27 VITALS — BP 131/87 | HR 78 | Resp 16 | Ht 60.0 in | Wt 156.8 lb

## 2023-02-27 DIAGNOSIS — Z21 Asymptomatic human immunodeficiency virus [HIV] infection status: Secondary | ICD-10-CM

## 2023-02-27 DIAGNOSIS — B20 Human immunodeficiency virus [HIV] disease: Secondary | ICD-10-CM

## 2023-02-27 NOTE — Progress Notes (Signed)
   Subjective:    Patient ID: Tina Huber, female    DOB: January 19, 1976, 48 y.o.   MRN: [id number]  HPI She comes in here for follow-up of HIV. She is on long-term nonprogressive has remained off of antiretroviral treatment.  Her last CD4 count was 881 with a viral load of just 24 copies.  She has been offered treatment but has opted to continue off of treatment since her immune system is rate remained intact and has had new issues.   Review of Systems  Constitutional:  Negative for fatigue.  Skin:  Negative for rash.       Objective:   Physical Exam Eyes:     General: No scleral icterus. Pulmonary:     Effort: Pulmonary effort is normal.  Neurological:     Mental Status: She is alert.   SH: no tobacco        Assessment & Plan:

## 2023-02-27 NOTE — Assessment & Plan Note (Signed)
She continues to do well off of treatment.  She is not interested in treatment at this time and will continue with periodic monitoring.  Previous labs reviewed with her.  I have personally spent 31 minutes involved in face-to-face and non-face-to-face activities for this patient on the day of the visit. Professional time spent includes the following activities: Preparing to see the patient (review of tests), Obtaining and/or reviewing separately obtained history (admission/discharge record), Performing a medically appropriate examination and/or evaluation , Ordering medications/tests/procedures, referring and communicating with other health care professionals, Documenting clinical information in the EMR, Independently interpreting results (not separately reported), Communicating results to the patient/family/caregiver, Counseling and educating the patient/family/caregiver and Care coordination (not separately reported).

## 2023-02-28 LAB — T-HELPER CELL (CD4) - (RCID CLINIC ONLY)
CD4 % Helper T Cell: 38 % (ref 33–65)
CD4 T Cell Abs: 1169 /uL (ref 400–1790)

## 2023-03-01 ENCOUNTER — Encounter
Payer: BC Managed Care – PPO | Attending: Physical Medicine & Rehabilitation | Admitting: Physical Medicine & Rehabilitation

## 2023-03-01 ENCOUNTER — Encounter: Payer: Self-pay | Admitting: Physical Medicine & Rehabilitation

## 2023-03-01 VITALS — BP 124/85 | HR 104 | Wt 154.0 lb

## 2023-03-01 DIAGNOSIS — M5441 Lumbago with sciatica, right side: Secondary | ICD-10-CM | POA: Diagnosis not present

## 2023-03-01 DIAGNOSIS — M542 Cervicalgia: Secondary | ICD-10-CM | POA: Diagnosis not present

## 2023-03-01 DIAGNOSIS — G8929 Other chronic pain: Secondary | ICD-10-CM | POA: Diagnosis not present

## 2023-03-01 DIAGNOSIS — Z79891 Long term (current) use of opiate analgesic: Secondary | ICD-10-CM | POA: Diagnosis not present

## 2023-03-01 DIAGNOSIS — Z139 Encounter for screening, unspecified: Secondary | ICD-10-CM | POA: Insufficient documentation

## 2023-03-01 DIAGNOSIS — Z5181 Encounter for therapeutic drug level monitoring: Secondary | ICD-10-CM | POA: Diagnosis not present

## 2023-03-01 DIAGNOSIS — D573 Sickle-cell trait: Secondary | ICD-10-CM | POA: Insufficient documentation

## 2023-03-01 DIAGNOSIS — G894 Chronic pain syndrome: Secondary | ICD-10-CM | POA: Diagnosis not present

## 2023-03-01 LAB — HIV-1 RNA QUANT-NO REFLEX-BLD
HIV 1 RNA Quant: 22 {copies}/mL — ABNORMAL HIGH
HIV-1 RNA Quant, Log: 1.34 {Log} — ABNORMAL HIGH
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
## 2023-03-06 LAB — TOXASSURE SELECT,+ANTIDEPR,UR

## 2023-03-07 LAB — QUANTIFERON-TB GOLD PLUS
QuantiFERON Nil Value: 0.12 [IU]/mL
QuantiFERON TB1 Ag Value: 0.23 [IU]/mL
QuantiFERON TB2 Ag Value: 0.14 [IU]/mL

## 2023-03-07 NOTE — Telephone Encounter (Unsigned)
 Copied from CRM [id number]. 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 Callback number [phone]

## 2023-03-28 ENCOUNTER — Ambulatory Visit (INDEPENDENT_AMBULATORY_CARE_PROVIDER_SITE_OTHER): Payer: BC Managed Care – PPO | Admitting: Nurse Practitioner

## 2023-03-28 ENCOUNTER — Encounter: Payer: Self-pay | Admitting: Nurse Practitioner

## 2023-03-28 VITALS — BP 108/90 | HR 90 | Temp 98.4°F | Wt 157.0 lb

## 2023-03-28 DIAGNOSIS — Z13220 Encounter for screening for lipoid disorders: Secondary | ICD-10-CM | POA: Diagnosis not present

## 2023-03-28 DIAGNOSIS — E049 Nontoxic goiter, unspecified: Secondary | ICD-10-CM | POA: Diagnosis not present
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
## 2023-03-28 NOTE — Patient Instructions (Signed)
 1. Enlarged thyroid (Primary)  - Thyroid Panel With TSH - US THYROID  2. Lipid screening  - Lipid Panel

## 2023-03-29 LAB — LIPID PANEL
Chol/HDL Ratio: 3.3 ratio (ref 0.0–4.4)
Cholesterol, Total: 193 mg/dL (ref 100–199)
HDL: 58 mg/dL (ref >39)
LDL Chol Calc (NIH): 117 mg/dL — ABNORMAL HIGH (ref 0–99)
Triglycerides: 101 mg/dL (ref 0–149)
VLDL Cholesterol Cal: 18 mg/dL (ref 5–40)

## 2023-03-29 LAB — THYROID PANEL WITH TSH
Free Thyroxine Index: 1.5 (ref 1.2–4.9)
T3 Uptake Ratio: 24 % (ref 24–39)
T4, Total: 6.3 ug/dL (ref 4.5–12.0)
TSH: 2.27 u[IU]/mL (ref 0.450–4.500)

## 2023-04-03 ENCOUNTER — Ambulatory Visit
Admission: RE | Admit: 2023-04-03 | Discharge: 2023-04-03 | Disposition: A | Discharge status: Home / Self Care | Payer: BC Managed Care – PPO | Source: Ambulatory Visit | Attending: Nurse Practitioner | Admitting: Nurse Practitioner

## 2023-05-02 ENCOUNTER — Other Ambulatory Visit: Payer: Self-pay | Admitting: Physical Medicine & Rehabilitation

## 2023-05-03 ENCOUNTER — Telehealth: Payer: Self-pay | Admitting: Physical Medicine & Rehabilitation

## 2023-05-03 MED ORDER — TRAMADOL HCL 50 MG PO TABS
50.0000 mg | ORAL_TABLET | Freq: Four times a day (QID) | ORAL | 0 refills | Status: DC | PRN
Start: 2023-05-03 — End: 2023-08-23

## 2023-05-03 NOTE — Telephone Encounter (Signed)
 Please refill tramadol  [employer]

## 2023-05-07 ENCOUNTER — Other Ambulatory Visit: Payer: Self-pay | Admitting: Physical Medicine & Rehabilitation

## 2023-05-08 NOTE — Telephone Encounter (Signed)
 PA sent and denied. Patient will need to call Medicaid to confirm her plan or plans. Phone [phone]. Patient has been informed of the GoodRx option.

## 2023-05-13 NOTE — Telephone Encounter (Signed)
 Outcome N/A on April 9 by [employer] 2017 General.Closed by health plan.We thank you for taking the time to submit your request. However, this member has alternative pharmacy benefits. [employer] [place]  is the payer of last resort. Please have the pharmacy bill the member's other insurance plan first. If the member feels that this is in error, please have the member call Member Services at [phone]. If the requested medication is not covered or was denied by the [facility] primary insurance, an explanation of benefits or proof of denial must be submitted with the request prior to coverage by [employer]  .

## 2023-05-27 ENCOUNTER — Encounter: Payer: BC Managed Care – PPO | Admitting: Physical Medicine & Rehabilitation

## 2023-06-03 ENCOUNTER — Telehealth: Payer: Self-pay

## 2023-06-03 NOTE — Telephone Encounter (Signed)
 Left patient a voice mail to call back to reschedule appointment with a different provider.

## 2023-07-03 ENCOUNTER — Encounter: Payer: Self-pay | Admitting: Nurse Practitioner

## 2023-07-03 ENCOUNTER — Ambulatory Visit: Payer: Self-pay | Admitting: Nurse Practitioner

## 2023-07-03 VITALS — BP 115/79 | HR 103 | Temp 98.2°F | Wt 166.8 lb

## 2023-07-03 DIAGNOSIS — Z0000 Encounter for general adult medical examination without abnormal findings: Secondary | ICD-10-CM | POA: Diagnosis not present

## 2023-07-03 DIAGNOSIS — Z3202 Encounter for pregnancy test, result negative: Secondary | ICD-10-CM | POA: Diagnosis not present

## 2023-07-03 DIAGNOSIS — N914 Secondary oligomenorrhea: Secondary | ICD-10-CM | POA: Diagnosis not present

## 2023-07-03 DIAGNOSIS — E7800 Pure hypercholesterolemia, unspecified: Secondary | ICD-10-CM | POA: Diagnosis not present

## 2023-07-03 DIAGNOSIS — N951 Menopausal and female climacteric states: Secondary | ICD-10-CM | POA: Diagnosis not present

## 2023-07-03 MED ORDER — ONDANSETRON 4 MG PO TBDP: 4.0000 mg | ORAL_TABLET | Freq: Three times a day (TID) | ORAL | 0 refills | Status: DC | PRN

## 2023-07-03 MED ORDER — ATORVASTATIN CALCIUM 10 MG PO TABS: 10.0000 mg | ORAL_TABLET | Freq: Every day | ORAL | 1 refills | Status: AC

## 2023-07-03 MED ORDER — OMEPRAZOLE 40 MG PO CPDR: 40.0000 mg | DELAYED_RELEASE_CAPSULE | Freq: Two times a day (BID) | ORAL | 3 refills | Status: AC

## 2023-07-03 MED ORDER — METOPROLOL TARTRATE 25 MG PO TABS: ORAL_TABLET | ORAL | 1 refills | Status: AC

## 2023-07-03 NOTE — Patient Instructions (Signed)
 1. Routine adult health maintenance (Primary)  - CBC - Comprehensive metabolic panel with GFR  2. Hypercholesteremia  - atorvastatin  (LIPITOR) 10 MG tablet; Take 1 tablet (10 mg total) by mouth daily.  Dispense: 90 tablet; Refill: 1
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
## 2023-07-04 ENCOUNTER — Ambulatory Visit: Payer: Self-pay | Admitting: Nurse Practitioner

## 2023-07-04 LAB — COMPREHENSIVE METABOLIC PANEL WITH GFR
ALT: 15 IU/L (ref 0–32)
AST: 22 IU/L (ref 0–40)
Albumin: 4.1 g/dL (ref 3.9–4.9)
Alkaline Phosphatase: 96 IU/L (ref 44–121)
BUN/Creatinine Ratio: 18 (ref 9–23)
BUN: 14 mg/dL (ref 6–24)
Bilirubin Total: <0.2 mg/dL (ref 0.0–1.2)
CO2: 23 mmol/L (ref 20–29)
Calcium: 9.1 mg/dL (ref 8.7–10.2)
Chloride: 100 mmol/L (ref 96–106)
Creatinine, Ser: 0.8 mg/dL (ref 0.57–1.00)
Globulin, Total: 3.4 g/dL (ref 1.5–4.5)
Glucose: 124 mg/dL — ABNORMAL HIGH (ref 70–99)
Potassium: 4.3 mmol/L (ref 3.5–5.2)
Sodium: 139 mmol/L (ref 134–144)
Total Protein: 7.5 g/dL (ref 6.0–8.5)
eGFR: 92 mL/min/{1.73_m2} (ref >59)

## 2023-07-04 LAB — CBC
Hematocrit: 37.8 % (ref 34.0–46.6)
Hemoglobin: 12.6 g/dL (ref 11.1–15.9)
MCH: 29.9 pg (ref 26.6–33.0)
MCHC: 33.3 g/dL (ref 31.5–35.7)
MCV: 90 fL (ref 79–97)
Platelets: 228 10*3/uL (ref 150–450)
RBC: 4.22 x10E6/uL (ref 3.77–5.28)
RDW: 14.4 % (ref 11.7–15.4)
WBC: 4.5 10*3/uL (ref 3.4–10.8)

## 2023-07-17 DIAGNOSIS — N914 Secondary oligomenorrhea: Secondary | ICD-10-CM | POA: Diagnosis not present

## 2023-08-09 ENCOUNTER — Other Ambulatory Visit: Payer: Self-pay

## 2023-08-09 DIAGNOSIS — Z113 Encounter for screening for infections with a predominantly sexual mode of transmission: Secondary | ICD-10-CM

## 2023-08-09 DIAGNOSIS — Z21 Asymptomatic human immunodeficiency virus [HIV] infection status: Secondary | ICD-10-CM

## 2023-08-13 ENCOUNTER — Other Ambulatory Visit: Payer: BC Managed Care – PPO

## 2023-08-20 ENCOUNTER — Other Ambulatory Visit: Payer: Self-pay | Admitting: Nurse Practitioner

## 2023-08-20 DIAGNOSIS — M545 Low back pain, unspecified: Secondary | ICD-10-CM

## 2023-08-23 ENCOUNTER — Encounter: Payer: Self-pay | Admitting: Physical Medicine & Rehabilitation

## 2023-08-23 ENCOUNTER — Encounter: Attending: Physical Medicine & Rehabilitation | Admitting: Physical Medicine & Rehabilitation

## 2023-08-23 VITALS — BP 123/83 | HR 92 | Ht 60.0 in | Wt 158.0 lb

## 2023-08-23 DIAGNOSIS — Z79891 Long term (current) use of opiate analgesic: Secondary | ICD-10-CM | POA: Insufficient documentation

## 2023-08-23 DIAGNOSIS — G8929 Other chronic pain: Secondary | ICD-10-CM | POA: Insufficient documentation

## 2023-08-23 DIAGNOSIS — M797 Fibromyalgia: Secondary | ICD-10-CM | POA: Insufficient documentation

## 2023-08-23 DIAGNOSIS — M5441 Lumbago with sciatica, right side: Secondary | ICD-10-CM | POA: Insufficient documentation

## 2023-08-23 DIAGNOSIS — G894 Chronic pain syndrome: Secondary | ICD-10-CM | POA: Diagnosis not present

## 2023-08-23 DIAGNOSIS — Z5181 Encounter for therapeutic drug level monitoring: Secondary | ICD-10-CM | POA: Diagnosis not present

## 2023-08-23 MED ORDER — PREGABALIN 100 MG PO CAPS: 100.0000 mg | ORAL_CAPSULE | Freq: Three times a day (TID) | ORAL | 4 refills | Status: DC

## 2023-08-23 MED ORDER — TRAMADOL HCL 50 MG PO TABS: 50.0000 mg | ORAL_TABLET | Freq: Four times a day (QID) | ORAL | 0 refills | Status: DC | PRN
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
## 2023-08-27 ENCOUNTER — Ambulatory Visit: Payer: Self-pay | Admitting: Internal Medicine

## 2023-08-28 LAB — DRUG TOX MONITOR 1 W/CONF, ORAL FLD
Amphetamines: NEGATIVE ng/mL (ref <10)
Barbiturates: NEGATIVE ng/mL (ref <10)
Benzodiazepines: NEGATIVE ng/mL (ref <0.50)
Buprenorphine: NEGATIVE ng/mL (ref <0.10)
Cocaine: NEGATIVE ng/mL (ref <5.0)
Fentanyl: NEGATIVE ng/mL (ref <0.10)
Heroin Metabolite: NEGATIVE ng/mL (ref <1.0)
MARIJUANA: NEGATIVE ng/mL (ref <2.5)
MDMA: NEGATIVE ng/mL (ref <10)
Meprobamate: NEGATIVE ng/mL (ref <2.5)
Methadone: NEGATIVE ng/mL (ref <5.0)
Nicotine Metabolite: NEGATIVE ng/mL (ref <5.0)
Opiates: NEGATIVE ng/mL (ref <2.5)
Phencyclidine: NEGATIVE ng/mL (ref <10)
Tapentadol: NEGATIVE ng/mL (ref <5.0)
Tramadol: 53.8 ng/mL — ABNORMAL HIGH (ref <5.0)
Tramadol: POSITIVE ng/mL — AB (ref <5.0)
Zolpidem: NEGATIVE ng/mL (ref <5.0)

## 2023-08-28 LAB — DRUG TOX ALC METAB W/CON, ORAL FLD: Alcohol Metabolite: NEGATIVE ng/mL (ref <25)

## 2023-08-30 ENCOUNTER — Telehealth: Payer: Self-pay

## 2023-08-30 NOTE — Telephone Encounter (Signed)
 PA for Tramadol  50 MG created in Cover My Med.

## 2023-09-03 NOTE — Telephone Encounter (Signed)
 Tramadol  50 MG tablet approved 08/30/2023-03/01/2024.

## 2023-09-24 ENCOUNTER — Other Ambulatory Visit: Payer: Self-pay

## 2023-09-24 DIAGNOSIS — Z21 Asymptomatic human immunodeficiency virus [HIV] infection status: Secondary | ICD-10-CM

## 2023-09-24 DIAGNOSIS — Z79899 Other long term (current) drug therapy: Secondary | ICD-10-CM

## 2023-09-24 DIAGNOSIS — Z113 Encounter for screening for infections with a predominantly sexual mode of transmission: Secondary | ICD-10-CM

## 2023-09-25 ENCOUNTER — Ambulatory Visit (INDEPENDENT_AMBULATORY_CARE_PROVIDER_SITE_OTHER): Payer: BC Managed Care – PPO | Admitting: Nurse Practitioner

## 2023-09-25 ENCOUNTER — Encounter: Payer: Self-pay | Admitting: Nurse Practitioner

## 2023-09-25 VITALS — BP 117/82 | HR 73 | Temp 98.2°F | Wt 164.8 lb

## 2023-09-25 DIAGNOSIS — U071 COVID-19: Secondary | ICD-10-CM | POA: Diagnosis not present

## 2023-09-25 DIAGNOSIS — R6889 Other general symptoms and signs: Secondary | ICD-10-CM

## 2023-09-25 LAB — POC COVID19/FLU A&B COMBO
Covid Antigen, POC: POSITIVE — AB
Influenza A Antigen, POC: NEGATIVE
Influenza B Antigen, POC: NEGATIVE

## 2023-09-25 MED ORDER — ALBUTEROL SULFATE HFA 108 (90 BASE) MCG/ACT IN AERS
1.0000 | INHALATION_SPRAY | RESPIRATORY_TRACT | 0 refills | Status: AC | PRN
Start: 2023-09-25 — End: ?

## 2023-09-25 MED ORDER — AMOXICILLIN-POT CLAVULANATE 875-125 MG PO TABS: 1.0000 | ORAL_TABLET | Freq: Two times a day (BID) | ORAL | 0 refills | Status: DC

## 2023-09-25 MED ORDER — PREDNISONE 20 MG PO TABS: 20.0000 mg | ORAL_TABLET | Freq: Every day | ORAL | 0 refills | Status: DC

## 2023-09-25 MED ORDER — ONDANSETRON 4 MG PO TBDP: 4.0000 mg | ORAL_TABLET | Freq: Three times a day (TID) | ORAL | 0 refills | Status: DC | PRN

## 2023-09-25 MED ORDER — ALBUTEROL SULFATE (2.5 MG/3ML) 0.083% IN NEBU: 2.5000 mg | INHALATION_SOLUTION | RESPIRATORY_TRACT | 2 refills | Status: DC | PRN

## 2023-09-25 MED ORDER — ALBUTEROL SULFATE HFA 108 (90 BASE) MCG/ACT IN AERS: 1.0000 | INHALATION_SPRAY | RESPIRATORY_TRACT | 0 refills | Status: DC | PRN

## 2023-09-25 MED ORDER — ALBUTEROL SULFATE (2.5 MG/3ML) 0.083% IN NEBU
2.5000 mg | INHALATION_SOLUTION | RESPIRATORY_TRACT | 2 refills | Status: AC | PRN
Start: 2023-09-25 — End: 2024-09-24
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
## 2023-09-30 ENCOUNTER — Encounter: Payer: Self-pay | Admitting: Physical Medicine & Rehabilitation

## 2023-10-01 MED ORDER — TRAMADOL HCL 50 MG PO TABS: 50.0000 mg | ORAL_TABLET | Freq: Four times a day (QID) | ORAL | 2 refills | Status: DC | PRN

## 2023-10-02 ENCOUNTER — Other Ambulatory Visit: Payer: Self-pay

## 2023-10-02 ENCOUNTER — Other Ambulatory Visit

## 2023-10-02 DIAGNOSIS — Z21 Asymptomatic human immunodeficiency virus [HIV] infection status: Secondary | ICD-10-CM

## 2023-10-02 DIAGNOSIS — Z79899 Other long term (current) drug therapy: Secondary | ICD-10-CM | POA: Diagnosis not present

## 2023-10-02 DIAGNOSIS — Z113 Encounter for screening for infections with a predominantly sexual mode of transmission: Secondary | ICD-10-CM | POA: Diagnosis not present

## 2023-10-03 LAB — T-HELPER CELL (CD4) - (RCID CLINIC ONLY)
CD4 % Helper T Cell: 41 % (ref 33–65)
CD4 T Cell Abs: 995 /uL (ref 400–1790)

## 2023-10-04 LAB — COMPLETE METABOLIC PANEL WITHOUT GFR
AG Ratio: 1.2 (calc) (ref 1.0–2.5)
ALT: 29 U/L (ref 6–29)
AST: 22 U/L (ref 10–35)
Albumin: 3.9 g/dL (ref 3.6–5.1)
Alkaline phosphatase (APISO): 69 U/L (ref 31–125)
BUN: 13 mg/dL (ref 7–25)
CO2: 32 mmol/L (ref 20–32)
Calcium: 9.1 mg/dL (ref 8.6–10.2)
Chloride: 99 mmol/L (ref 98–110)
Creat: 0.8 mg/dL (ref 0.50–0.99)
Globulin: 3.3 g/dL (ref 1.9–3.7)
Glucose, Bld: 97 mg/dL (ref 65–99)
Potassium: 4.1 mmol/L (ref 3.5–5.3)
Sodium: 138 mmol/L (ref 135–146)
Total Bilirubin: 0.4 mg/dL (ref 0.2–1.2)
Total Protein: 7.2 g/dL (ref 6.1–8.1)

## 2023-10-04 LAB — CBC WITH DIFFERENTIAL/PLATELET
Absolute Lymphocytes: 2903 {cells}/uL (ref 850–3900)
Absolute Monocytes: 419 {cells}/uL (ref 200–950)
Basophils Absolute: 12 {cells}/uL (ref 0–200)
Basophils Relative: 0.2 %
Eosinophils Absolute: 71 {cells}/uL (ref 15–500)
Eosinophils Relative: 1.2 %
HCT: 39.3 % (ref 35.0–45.0)
Hemoglobin: 12.8 g/dL (ref 11.7–15.5)
MCH: 29.3 pg (ref 27.0–33.0)
MCHC: 32.6 g/dL (ref 32.0–36.0)
MCV: 89.9 fL (ref 80.0–100.0)
MPV: 11 fL (ref 7.5–12.5)
Monocytes Relative: 7.1 %
Neutro Abs: 2496 {cells}/uL (ref 1500–7800)
Neutrophils Relative %: 42.3 %
Platelets: 248 Thousand/uL (ref 140–400)
RBC: 4.37 Million/uL (ref 3.80–5.10)
RDW: 14 % (ref 11.0–15.0)
Total Lymphocyte: 49.2 %
WBC: 5.9 Thousand/uL (ref 3.8–10.8)

## 2023-10-04 LAB — LIPID PANEL
Cholesterol: 159 mg/dL (ref <200)
HDL: 49 mg/dL — ABNORMAL LOW (ref >=50)
LDL Cholesterol (Calc): 83 mg/dL
Non-HDL Cholesterol (Calc): 110 mg/dL (ref <130)
Total CHOL/HDL Ratio: 3.2 (calc) (ref <5.0)
Triglycerides: 177 mg/dL — ABNORMAL HIGH (ref <150)

## 2023-10-04 LAB — HIV-1 RNA QUANT-NO REFLEX-BLD
HIV 1 RNA Quant: NOT DETECTED {copies}/mL
HIV-1 RNA Quant, Log: NOT DETECTED {Log_copies}/mL

## 2023-10-04 LAB — RPR: RPR Ser Ql: NONREACTIVE

## 2023-10-17 ENCOUNTER — Encounter: Admitting: Physical Medicine & Rehabilitation

## 2023-10-23 ENCOUNTER — Ambulatory Visit: Admitting: Internal Medicine

## 2023-10-28 ENCOUNTER — Encounter: Payer: Self-pay | Admitting: Physical Medicine & Rehabilitation

## 2023-10-28 ENCOUNTER — Encounter: Attending: Physical Medicine & Rehabilitation | Admitting: Physical Medicine & Rehabilitation

## 2023-10-28 VITALS — BP 105/76 | HR 115 | Ht 60.0 in | Wt 162.0 lb

## 2023-10-28 DIAGNOSIS — G894 Chronic pain syndrome: Secondary | ICD-10-CM | POA: Insufficient documentation

## 2023-10-28 DIAGNOSIS — D573 Sickle-cell trait: Secondary | ICD-10-CM | POA: Insufficient documentation

## 2023-10-28 DIAGNOSIS — Z79891 Long term (current) use of opiate analgesic: Secondary | ICD-10-CM | POA: Diagnosis not present

## 2023-10-28 DIAGNOSIS — G8929 Other chronic pain: Secondary | ICD-10-CM | POA: Insufficient documentation

## 2023-10-28 DIAGNOSIS — M797 Fibromyalgia: Secondary | ICD-10-CM | POA: Insufficient documentation

## 2023-10-28 DIAGNOSIS — M5441 Lumbago with sciatica, right side: Secondary | ICD-10-CM | POA: Insufficient documentation
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
## 2023-11-06 ENCOUNTER — Ambulatory Visit: Admitting: Internal Medicine

## 2023-11-06 ENCOUNTER — Other Ambulatory Visit: Payer: Self-pay

## 2023-11-06 VITALS — BP 138/90 | HR 95 | Temp 98.8°F | Resp 16 | Wt 163.0 lb

## 2023-11-06 DIAGNOSIS — B20 Human immunodeficiency virus [HIV] disease: Secondary | ICD-10-CM | POA: Diagnosis not present

## 2023-11-06 DIAGNOSIS — K1239 Other oral mucositis (ulcerative): Secondary | ICD-10-CM

## 2023-11-06 DIAGNOSIS — Z23 Encounter for immunization: Secondary | ICD-10-CM | POA: Diagnosis not present

## 2023-11-06 DIAGNOSIS — M069 Rheumatoid arthritis, unspecified: Secondary | ICD-10-CM

## 2023-11-06 MED ORDER — VALACYCLOVIR HCL 1 G PO TABS: 1000.0000 mg | ORAL_TABLET | Freq: Two times a day (BID) | ORAL | 0 refills | Status: AC
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
## 2023-11-06 NOTE — Patient Instructions (Signed)
 Lab visit 1 weeks prior to appt

## 2023-11-19 DIAGNOSIS — B20 Human immunodeficiency virus [HIV] disease: Secondary | ICD-10-CM | POA: Diagnosis not present

## 2023-11-19 DIAGNOSIS — H5213 Myopia, bilateral: Secondary | ICD-10-CM | POA: Diagnosis not present

## 2023-11-19 DIAGNOSIS — Z1389 Encounter for screening for other disorder: Secondary | ICD-10-CM | POA: Diagnosis not present

## 2023-11-19 DIAGNOSIS — Z01419 Encounter for gynecological examination (general) (routine) without abnormal findings: Secondary | ICD-10-CM | POA: Diagnosis not present

## 2023-11-19 DIAGNOSIS — Z113 Encounter for screening for infections with a predominantly sexual mode of transmission: Secondary | ICD-10-CM | POA: Diagnosis not present

## 2023-11-19 DIAGNOSIS — Z862 Personal history of diseases of the blood and blood-forming organs and certain disorders involving the immune mechanism: Secondary | ICD-10-CM | POA: Diagnosis not present

## 2023-11-19 DIAGNOSIS — N920 Excessive and frequent menstruation with regular cycle: Secondary | ICD-10-CM | POA: Diagnosis not present

## 2023-11-19 DIAGNOSIS — Z1231 Encounter for screening mammogram for malignant neoplasm of breast: Secondary | ICD-10-CM | POA: Diagnosis not present

## 2023-11-19 DIAGNOSIS — N951 Menopausal and female climacteric states: Secondary | ICD-10-CM | POA: Diagnosis not present

## 2023-11-19 DIAGNOSIS — Z202 Contact with and (suspected) exposure to infections with a predominantly sexual mode of transmission: Secondary | ICD-10-CM | POA: Diagnosis not present

## 2023-12-02 ENCOUNTER — Encounter: Payer: Self-pay | Admitting: Radiology

## 2023-12-29 ENCOUNTER — Emergency Department (HOSPITAL_BASED_OUTPATIENT_CLINIC_OR_DEPARTMENT_OTHER)

## 2023-12-29 ENCOUNTER — Observation Stay (HOSPITAL_BASED_OUTPATIENT_CLINIC_OR_DEPARTMENT_OTHER)
Admission: EM | Admit: 2023-12-29 | Discharge: 2023-12-31 | DRG: 812 | Disposition: A | Attending: Internal Medicine | Admitting: Internal Medicine

## 2023-12-29 ENCOUNTER — Encounter (HOSPITAL_BASED_OUTPATIENT_CLINIC_OR_DEPARTMENT_OTHER): Payer: Self-pay

## 2023-12-29 DIAGNOSIS — E782 Mixed hyperlipidemia: Secondary | ICD-10-CM

## 2023-12-29 DIAGNOSIS — D5700 Hb-SS disease with crisis, unspecified: Secondary | ICD-10-CM | POA: Diagnosis not present

## 2023-12-29 DIAGNOSIS — Z8739 Personal history of other diseases of the musculoskeletal system and connective tissue: Secondary | ICD-10-CM

## 2023-12-29 DIAGNOSIS — R059 Cough, unspecified: Secondary | ICD-10-CM | POA: Diagnosis not present

## 2023-12-29 DIAGNOSIS — K5900 Constipation, unspecified: Secondary | ICD-10-CM

## 2023-12-29 DIAGNOSIS — M797 Fibromyalgia: Secondary | ICD-10-CM

## 2023-12-29 DIAGNOSIS — Z21 Asymptomatic human immunodeficiency virus [HIV] infection status: Secondary | ICD-10-CM

## 2023-12-29 DIAGNOSIS — I10 Essential (primary) hypertension: Secondary | ICD-10-CM | POA: Diagnosis present

## 2023-12-29 LAB — RETICULOCYTES
Immature Retic Fract: 12.7 % (ref 2.3–15.9)
RBC.: 4.43 MIL/uL (ref 3.87–5.11)
Retic Count, Absolute: 66.5 K/uL (ref 19.0–186.0)
Retic Ct Pct: 1.5 % (ref 0.4–3.1)

## 2023-12-29 LAB — CBC WITH DIFFERENTIAL/PLATELET
Abs Immature Granulocytes: 0.02 K/uL (ref 0.00–0.07)
Basophils Absolute: 0 K/uL (ref 0.0–0.1)
Basophils Relative: 0 %
Eosinophils Absolute: 0.1 K/uL (ref 0.0–0.5)
Eosinophils Relative: 1 %
HCT: 36.6 % (ref 36.0–46.0)
Hemoglobin: 12.9 g/dL (ref 12.0–15.0)
Immature Granulocytes: 0 %
Lymphocytes Relative: 52 %
Lymphs Abs: 2.6 K/uL (ref 0.7–4.0)
MCH: 29.7 pg (ref 26.0–34.0)
MCHC: 35.2 g/dL (ref 30.0–36.0)
MCV: 84.3 fL (ref 80.0–100.0)
Monocytes Absolute: 0.5 K/uL (ref 0.1–1.0)
Monocytes Relative: 11 %
Neutro Abs: 1.8 K/uL (ref 1.7–7.7)
Neutrophils Relative %: 36 %
Platelets: 249 K/uL (ref 150–400)
RBC: 4.34 MIL/uL (ref 3.87–5.11)
RDW: 14.2 % (ref 11.5–15.5)
WBC: 5.1 K/uL (ref 4.0–10.5)
nRBC: 0 % (ref 0.0–0.2)

## 2023-12-29 LAB — COMPREHENSIVE METABOLIC PANEL WITH GFR
ALT: 27 U/L (ref 0–44)
AST: 29 U/L (ref 15–41)
Albumin: 4.2 g/dL (ref 3.5–5.0)
Alkaline Phosphatase: 94 U/L (ref 38–126)
Anion gap: 10 (ref 5–15)
BUN: 14 mg/dL (ref 6–20)
CO2: 26 mmol/L (ref 22–32)
Calcium: 9.8 mg/dL (ref 8.9–10.3)
Chloride: 101 mmol/L (ref 98–111)
Creatinine, Ser: 0.75 mg/dL (ref 0.44–1.00)
GFR, Estimated: >60 mL/min (ref >60)
Glucose, Bld: 129 mg/dL — ABNORMAL HIGH (ref 70–99)
Potassium: 4.2 mmol/L (ref 3.5–5.1)
Sodium: 137 mmol/L (ref 135–145)
Total Bilirubin: 0.3 mg/dL (ref 0.0–1.2)
Total Protein: 8.3 g/dL — ABNORMAL HIGH (ref 6.5–8.1)

## 2023-12-29 LAB — RESP PANEL BY RT-PCR (RSV, FLU A&B, COVID)  RVPGX2
Influenza A by PCR: NEGATIVE
Influenza B by PCR: NEGATIVE
Resp Syncytial Virus by PCR: NEGATIVE
SARS Coronavirus 2 by RT PCR: NEGATIVE

## 2023-12-29 MED ORDER — SODIUM CHLORIDE 0.9 % IV SOLN
12.5000 mg | Freq: Once | INTRAVENOUS | Status: AC
  Administered 2023-12-29: 12.5 mg via INTRAVENOUS
  Filled 2023-12-29: qty 0.25

## 2023-12-29 MED ORDER — HYDROMORPHONE HCL 1 MG/ML IJ SOLN
1.0000 mg | INTRAMUSCULAR | Status: AC
  Administered 2023-12-29: 1 mg via INTRAVENOUS
  Filled 2023-12-29: qty 1

## 2023-12-29 MED ORDER — HYDROMORPHONE HCL 1 MG/ML IJ SOLN
0.5000 mg | INTRAMUSCULAR | Status: AC
  Administered 2023-12-29: 0.5 mg via INTRAVENOUS
  Filled 2023-12-29: qty 1

## 2023-12-29 MED ORDER — ONDANSETRON HCL 4 MG/2ML IJ SOLN
4.0000 mg | INTRAMUSCULAR | Status: DC | PRN
  Administered 2023-12-30 (×3): 4 mg via INTRAVENOUS
  Filled 2023-12-29 (×3): qty 2

## 2023-12-29 MED ORDER — DIPHENHYDRAMINE HCL 50 MG/ML IJ SOLN
INTRAMUSCULAR | Status: AC
  Filled 2023-12-29: qty 1

## 2023-12-29 MED ORDER — SODIUM CHLORIDE 0.45 % IV SOLN: INTRAVENOUS | Status: DC

## 2023-12-29 NOTE — ED Triage Notes (Signed)
 Pt reports pain in bilateral legs and abd. Hx sickle cell. Pt states she is in sickle cell crisis. Reports pain x1 week, worsening yesterday into today.

## 2023-12-29 NOTE — ED Notes (Signed)
 Attempted to call 6E for nursing report. No answer.

## 2023-12-29 NOTE — ED Notes (Signed)
-  Called [facility] for transportation to [facility].

## 2023-12-29 NOTE — ED Provider Notes (Signed)
  EMERGENCY DEPARTMENT AT [facility] Provider Note   CSN: [id number] Arrival date & time: 12/29/23  2033     Patient presents with: Sickle Cell Pain Crisis   Tina Huber is a 48 y.o. female.   48 year old female with history of sickle disease presents due to pain to her legs consistent with her prior vaso-occlusive crisis.  States that she has had the symptoms for several days.  No fever or chills.  Slight cough but no shortness of breath.  No vomiting or diarrhea.  Pain has been unrelieved with her home tramadol .  No abdominal flank pain.       Prior to Admission medications   Medication Sig Start Date End Date Taking? Authorizing Provider  acetaminophen  (TYLENOL ) 500 MG tablet Take by mouth every 6 (six) hours as needed for mild pain.    [provider]  albuterol  (PROVENTIL ) (2.5 MG/3ML) 0.083% nebulizer solution Take 3 mLs (2.5 mg total) by nebulization every 4 (four) hours as needed for wheezing or shortness of breath. 09/25/23 09/24/24  Oley Bascom RAMAN, NP  albuterol  (VENTOLIN  HFA) 108 (90 Base) MCG/ACT inhaler Inhale 1 puff into the lungs every 4 (four) hours as needed for wheezing or shortness of breath. 09/25/23   Oley Bascom RAMAN, NP  amoxicillin -clavulanate (AUGMENTIN ) 875-125 MG tablet Take 1 tablet by mouth 2 (two) times daily. Patient not taking: Reported on 11/06/2023 09/25/23   Oley Bascom RAMAN, NP  atorvastatin  (LIPITOR) 10 MG tablet Take 1 tablet (10 mg total) by mouth daily. 07/03/23 12/30/23  Nichols, Tonya S, NP  celecoxib (CELEBREX) 200 MG capsule 1 capsule PO twice a day for 90 days    [provider]  cholecalciferol (VITAMIN D3) 25 MCG (1000 UNIT) tablet Take 1,000 Units by mouth daily.    [provider]  cyclobenzaprine  (FLEXERIL ) 10 MG tablet TAKE 1 TABLET BY MOUTH AT BEDTIME 08/21/23   Nichols, Tonya S, NP  diclofenac  Sodium (VOLTAREN  ARTHRITIS PAIN) 1 % GEL Apply 2 g topically 2 (two) times daily as needed (pain).  01/03/22   Oley Bascom RAMAN, NP  dicyclomine  (BENTYL ) 10 MG capsule Take 1 capsule (10 mg total) by mouth 4 (four) times daily -  before meals and at bedtime. Patient not taking: Reported on 11/06/2023 04/13/22   Eda Iha, MD  FLUBLOK 0.5 ML SOSY  10/24/22   [provider]  folic acid  (FOLVITE ) 1 MG tablet Take 1 mg by mouth daily.    [provider]  HEPLISAV-B injection  10/24/22   [provider]  Iron Combinations (IRON COMPLEX PO)  09/12/21   [provider]  Lidocaine  4 % PTCH Apply 1 patch topically daily. 02/17/21   Shannan Sia I, NP  metoprolol  tartrate (LOPRESSOR ) 25 MG tablet Take 1/2 (one-half) tablet by mouth twice daily 07/03/23   Nichols, Tonya S, NP  Multiple Vitamins-Minerals (MULTIVITAMIN WITH MINERALS) tablet Take 1 tablet by mouth daily.    [provider]  omeprazole  (PRILOSEC) 40 MG capsule Take 1 capsule (40 mg total) by mouth 2 (two) times daily. 07/03/23   Oley Bascom RAMAN, NP  ondansetron  (ZOFRAN -ODT) 4 MG disintegrating tablet Take 1 tablet (4 mg total) by mouth every 8 (eight) hours as needed. 09/25/23   Oley Bascom RAMAN, NP  polyethylene glycol (MIRALAX  / GLYCOLAX ) 17 g packet Take 17 g by mouth daily. 07/31/21   Jillian Buttery, MD  predniSONE  (DELTASONE ) 20 MG tablet Take 1 tablet (20 mg total) by mouth daily with breakfast. Patient  not taking: Reported on 11/06/2023 09/25/23   Oley Bascom RAMAN, NP  pregabalin  (LYRICA ) 100 MG capsule Take 1 capsule (100 mg total) by mouth 3 (three) times daily. 08/23/23   Urbano Albright, MD  senna (SENOKOT) 8.6 MG TABS tablet Take 1 tablet (8.6 mg total) by mouth 2 (two) times daily. 07/31/21   Adhikari, Amrit, MD  [facility] syringe  10/24/22   [provider]  traMADol  (ULTRAM ) 50 MG tablet Take 1-2 tablets (50-100 mg total) by mouth every 6 (six) hours as needed. 10/01/23   Urbano Albright, MD    Allergies: Latex, Quinine, and Quinine derivatives    Review of Systems  All  other systems reviewed and are negative.   Updated Vital Signs BP (!) 159/93   Pulse (!) 111   Temp 98.3 F (36.8 C) (Oral)   Resp (!) 24   Ht 1.524 m (5')   Wt 68 kg   SpO2 98%   BMI 29.29 kg/m   Physical Exam Vitals and nursing note reviewed.  Constitutional:      General: She is not in acute distress.    Appearance: Normal appearance. She is well-developed. She is not toxic-appearing.  HENT:     Head: Normocephalic and atraumatic.  Eyes:     General: Lids are normal.     Conjunctiva/sclera: Conjunctivae normal.     Pupils: Pupils are equal, round, and reactive to light.  Neck:     Thyroid : No thyroid  mass.     Trachea: No tracheal deviation.  Cardiovascular:     Rate and Rhythm: Normal rate and regular rhythm.     Heart sounds: Normal heart sounds. No murmur heard.    No gallop.  Pulmonary:     Effort: Pulmonary effort is normal. No respiratory distress.     Breath sounds: Normal breath sounds. No stridor. No decreased breath sounds, wheezing, rhonchi or rales.  Abdominal:     General: There is no distension.     Palpations: Abdomen is soft.     Tenderness: There is no abdominal tenderness. There is no rebound.  Musculoskeletal:        General: No tenderness. Normal range of motion.     Cervical back: Normal range of motion and neck supple.  Skin:    General: Skin is warm and dry.     Findings: No abrasion or rash.  Neurological:     Mental Status: She is alert and oriented to person, place, and time. Mental status is at baseline.     GCS: GCS eye subscore is 4. GCS verbal subscore is 5. GCS motor subscore is 6.     Cranial Nerves: No cranial nerve deficit.     Sensory: No sensory deficit.     Motor: Motor function is intact.  Psychiatric:        Attention and Perception: Attention normal.        Speech: Speech normal.        Behavior: Behavior normal.     (all labs ordered are listed, but only abnormal results are displayed) Labs Reviewed  RESP PANEL  BY RT-PCR (RSV, FLU A&B, COVID)  [id number]  CBC WITH DIFFERENTIAL/PLATELET  RETICULOCYTES  COMPREHENSIVE METABOLIC PANEL WITH GFR    EKG: None  Radiology: No results found.   Procedures   Medications Ordered in the ED  0.45 % sodium chloride  infusion ( Intravenous New Bag/Given 12/29/23 2111)  HYDROmorphone  (DILAUDID ) injection 1 mg (has no administration in time range)  HYDROmorphone  (DILAUDID ) injection  1 mg (has no administration in time range)  diphenhydrAMINE  (BENADRYL ) 12.5 mg in sodium chloride  0.9 % 50 mL IVPB (has no administration in time range)  ondansetron  (ZOFRAN ) injection 4 mg (has no administration in time range)  diphenhydrAMINE  (BENADRYL ) 50 MG/ML injection (has no administration in time range)  HYDROmorphone  (DILAUDID ) injection 0.5 mg (0.5 mg Intravenous Given 12/29/23 2111)                                    Medical Decision Making Amount and/or Complexity of Data Reviewed Labs: ordered. Radiology: ordered.  Risk Prescription drug management.   Patient treated with IV fluids as well as IV hydromorphone .  Labs are reassuring here.  She is afebrile.  Low suspicion for acute chest.  Chest x-ray was negative.  Hemoglobin stable.  Patient continues to look very uncomfortable at this time.  Will require admission.  Will consult hospitalist team     Final diagnoses:  None    ED Discharge Orders     None          Dasie Faden, MD 12/29/23 2215

## 2023-12-30 ENCOUNTER — Observation Stay (HOSPITAL_COMMUNITY)

## 2023-12-30 ENCOUNTER — Other Ambulatory Visit: Payer: Self-pay

## 2023-12-30 ENCOUNTER — Encounter: Admitting: Physical Medicine & Rehabilitation

## 2023-12-30 DIAGNOSIS — D5700 Hb-SS disease with crisis, unspecified: Secondary | ICD-10-CM | POA: Diagnosis not present

## 2023-12-30 DIAGNOSIS — Z8739 Personal history of other diseases of the musculoskeletal system and connective tissue: Secondary | ICD-10-CM

## 2023-12-30 DIAGNOSIS — E782 Mixed hyperlipidemia: Secondary | ICD-10-CM | POA: Diagnosis not present

## 2023-12-30 DIAGNOSIS — Z79899 Other long term (current) drug therapy: Secondary | ICD-10-CM | POA: Diagnosis not present

## 2023-12-30 DIAGNOSIS — F4310 Post-traumatic stress disorder, unspecified: Secondary | ICD-10-CM | POA: Diagnosis present

## 2023-12-30 DIAGNOSIS — M069 Rheumatoid arthritis, unspecified: Secondary | ICD-10-CM | POA: Diagnosis present

## 2023-12-30 DIAGNOSIS — Z1152 Encounter for screening for COVID-19: Secondary | ICD-10-CM | POA: Diagnosis not present

## 2023-12-30 DIAGNOSIS — I10 Essential (primary) hypertension: Secondary | ICD-10-CM | POA: Diagnosis not present

## 2023-12-30 DIAGNOSIS — M797 Fibromyalgia: Secondary | ICD-10-CM | POA: Diagnosis not present

## 2023-12-30 DIAGNOSIS — G894 Chronic pain syndrome: Secondary | ICD-10-CM

## 2023-12-30 DIAGNOSIS — Z21 Asymptomatic human immunodeficiency virus [HIV] infection status: Secondary | ICD-10-CM

## 2023-12-30 DIAGNOSIS — K5900 Constipation, unspecified: Secondary | ICD-10-CM | POA: Diagnosis not present

## 2023-12-30 DIAGNOSIS — Z833 Family history of diabetes mellitus: Secondary | ICD-10-CM | POA: Diagnosis not present

## 2023-12-30 LAB — BASIC METABOLIC PANEL WITH GFR
Anion gap: 10 (ref 5–15)
BUN: 15 mg/dL (ref 6–20)
CO2: 25 mmol/L (ref 22–32)
Calcium: 9.1 mg/dL (ref 8.9–10.3)
Chloride: 101 mmol/L (ref 98–111)
Creatinine, Ser: 0.73 mg/dL (ref 0.44–1.00)
GFR, Estimated: >60 mL/min (ref >60)
Glucose, Bld: 135 mg/dL — ABNORMAL HIGH (ref 70–99)
Potassium: 3.8 mmol/L (ref 3.5–5.1)
Sodium: 136 mmol/L (ref 135–145)

## 2023-12-30 LAB — CBC
HCT: 38.3 % (ref 36.0–46.0)
Hemoglobin: 12.5 g/dL (ref 12.0–15.0)
MCH: 28.7 pg (ref 26.0–34.0)
MCHC: 32.6 g/dL (ref 30.0–36.0)
MCV: 88 fL (ref 80.0–100.0)
Platelets: 246 K/uL (ref 150–400)
RBC: 4.35 MIL/uL (ref 3.87–5.11)
RDW: 14.6 % (ref 11.5–15.5)
WBC: 5.2 K/uL (ref 4.0–10.5)
nRBC: 0 % (ref 0.0–0.2)

## 2023-12-30 LAB — PREGNANCY, URINE: Preg Test, Ur: NEGATIVE

## 2023-12-30 MED ORDER — KETOROLAC TROMETHAMINE 15 MG/ML IJ SOLN
15.0000 mg | Freq: Four times a day (QID) | INTRAMUSCULAR | Status: DC
  Administered 2023-12-30 – 2023-12-31 (×7): 15 mg via INTRAVENOUS
  Filled 2023-12-30 (×7): qty 1

## 2023-12-30 MED ORDER — ATORVASTATIN CALCIUM 10 MG PO TABS
10.0000 mg | ORAL_TABLET | Freq: Every day | ORAL | Status: DC
  Administered 2023-12-30 – 2023-12-31 (×2): 10 mg via ORAL
  Filled 2023-12-30 (×2): qty 1

## 2023-12-30 MED ORDER — ADULT MULTIVITAMIN W/MINERALS CH
1.0000 | ORAL_TABLET | Freq: Every day | ORAL | Status: DC
  Administered 2023-12-30 – 2023-12-31 (×2): 1 via ORAL
  Filled 2023-12-30 (×2): qty 1

## 2023-12-30 MED ORDER — METOPROLOL TARTRATE 25 MG PO TABS
12.5000 mg | ORAL_TABLET | Freq: Two times a day (BID) | ORAL | Status: DC
  Administered 2023-12-30 – 2023-12-31 (×3): 12.5 mg via ORAL
  Filled 2023-12-30 (×3): qty 1

## 2023-12-30 MED ORDER — SENNOSIDES-DOCUSATE SODIUM 8.6-50 MG PO TABS
1.0000 | ORAL_TABLET | Freq: Two times a day (BID) | ORAL | Status: DC
  Administered 2023-12-30 – 2023-12-31 (×4): 1 via ORAL
  Filled 2023-12-30 (×4): qty 1

## 2023-12-30 MED ORDER — NALOXONE HCL 0.4 MG/ML IJ SOLN: 0.4000 mg | INTRAMUSCULAR | Status: DC | PRN

## 2023-12-30 MED ORDER — HYDROMORPHONE 1 MG/ML IV SOLN
INTRAVENOUS | Status: DC
  Administered 2023-12-30: 30 mg via INTRAVENOUS
  Administered 2023-12-30: 0.5 mg via INTRAVENOUS
  Administered 2023-12-30: 4 mg via INTRAVENOUS
  Administered 2023-12-30: 1.5 mg via INTRAVENOUS
  Filled 2023-12-30: qty 30

## 2023-12-30 MED ORDER — POLYETHYLENE GLYCOL 3350 17 G PO PACK: 17.0000 g | PACK | Freq: Every day | ORAL | Status: DC | PRN

## 2023-12-30 MED ORDER — HYDROMORPHONE HCL 1 MG/ML IJ SOLN
1.0000 mg | Freq: Once | INTRAMUSCULAR | Status: AC
  Administered 2023-12-30: 1 mg via INTRAVENOUS
  Filled 2023-12-30: qty 1

## 2023-12-30 MED ORDER — DIPHENHYDRAMINE HCL 25 MG PO CAPS: 25.0000 mg | ORAL_CAPSULE | ORAL | Status: DC | PRN

## 2023-12-30 MED ORDER — BISACODYL 5 MG PO TBEC
5.0000 mg | DELAYED_RELEASE_TABLET | Freq: Once | ORAL | Status: AC
  Administered 2023-12-30: 5 mg via ORAL
  Filled 2023-12-30: qty 1

## 2023-12-30 MED ORDER — SODIUM CHLORIDE 0.9% FLUSH: 9.0000 mL | INTRAVENOUS | Status: DC | PRN

## 2023-12-30 MED ORDER — ORAL CARE MOUTH RINSE: 15.0000 mL | OROMUCOSAL | Status: DC | PRN

## 2023-12-30 MED ORDER — PREGABALIN 50 MG PO CAPS
100.0000 mg | ORAL_CAPSULE | Freq: Three times a day (TID) | ORAL | Status: DC
  Administered 2023-12-30 – 2023-12-31 (×5): 100 mg via ORAL
  Filled 2023-12-30 (×5): qty 2

## 2023-12-30 MED ORDER — ENOXAPARIN SODIUM 40 MG/0.4ML IJ SOSY
40.0000 mg | PREFILLED_SYRINGE | INTRAMUSCULAR | Status: DC
  Administered 2023-12-30 – 2023-12-31 (×2): 40 mg via SUBCUTANEOUS
  Filled 2023-12-30 (×2): qty 0.4

## 2023-12-30 MED ORDER — FOLIC ACID 1 MG PO TABS
1.0000 mg | ORAL_TABLET | Freq: Every day | ORAL | Status: DC
  Administered 2023-12-30 – 2023-12-31 (×2): 1 mg via ORAL
  Filled 2023-12-30 (×2): qty 1

## 2023-12-30 MED ORDER — TRAMADOL HCL 50 MG PO TABS
50.0000 mg | ORAL_TABLET | Freq: Four times a day (QID) | ORAL | Status: DC | PRN
  Administered 2023-12-30: 100 mg via ORAL
  Administered 2023-12-30: 50 mg via ORAL
  Filled 2023-12-30 (×3): qty 1

## 2023-12-30 MED ORDER — VITAMIN D 25 MCG (1000 UNIT) PO TABS
1000.0000 [IU] | ORAL_TABLET | Freq: Every day | ORAL | Status: DC
  Administered 2023-12-30 – 2023-12-31 (×2): 1000 [IU] via ORAL
  Filled 2023-12-30 (×2): qty 1
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
## 2023-12-30 NOTE — Plan of Care (Signed)
  Problem: Education: Goal: Knowledge of General Education information will improve Description: Including pain rating scale, medication(s)/side effects and non-pharmacologic comfort measures Outcome: Progressing   Problem: Activity: Goal: Risk for activity intolerance will decrease Outcome: Progressing   Problem: Nutrition: Goal: Adequate nutrition will be maintained Outcome: Progressing   Problem: Coping: Goal: Level of anxiety will decrease Outcome: Progressing   Problem: Elimination: Goal: Will not experience complications related to bowel motility Outcome: Progressing   Problem: Pain Managment: Goal: General experience of comfort will improve and/or be controlled Outcome: Progressing   Problem: Safety: Goal: Ability to remain free from injury will improve Outcome: Progressing   Problem: Bowel/Gastric: Goal: Gut motility will be maintained Outcome: Progressing

## 2023-12-30 NOTE — Progress Notes (Signed)
 Mobility Specialist Progress Note:   12/30/23 1035  Mobility  Activity Dangled on edge of bed  Level of Assistance Independent  Range of Motion/Exercises Active  Activity Response Tolerated poorly  Mobility visit 1 Mobility  Mobility Specialist Start Time (ACUTE ONLY) 1023  Mobility Specialist Stop Time (ACUTE ONLY) 1033  Mobility Specialist Time Calculation (min) (ACUTE ONLY) 10 min   Pt was received in bed and agreed to mobility. Opted for bedside exercises. Seated BLE Exercises:  1) Knee Extension: 1 x 3 each leg  Deferred further mobility due to pain. Returned to bed with all needs met. Call bell in reach.    [employer] - Mobility Specialist

## 2023-12-30 NOTE — Progress Notes (Signed)
 Tina Huber is a 48 y.o. female with medical history significant for sickle cell trait, chronic pain syndrome, asymptomatic HIV infection, rheumatoid arthritis, hypertension and hyperlipidemia who presented to the [facility] for evaluation of worsening bilateral leg pain. Patient is not in sickle cell crisis or have sickle cell disease.  Patient has sickle cell trait and reports her leg pain is getting better, she has rheumatoid arthritis that may be precipitating her current pain symptoms. I discontinued PCA pump. [facility] hospitalist called and notified for reassignment.

## 2023-12-31 MED ORDER — POLYETHYLENE GLYCOL 3350 17 G PO PACK: 17.0000 g | PACK | Freq: Every day | ORAL | 0 refills | Status: AC

## 2023-12-31 MED ORDER — SENNA 8.6 MG PO TABS: 1.0000 | ORAL_TABLET | Freq: Two times a day (BID) | ORAL | 0 refills | Status: AC

## 2023-12-31 NOTE — Plan of Care (Signed)
  Problem: Education: Goal: Knowledge of General Education information will improve Description: Including pain rating scale, medication(s)/side effects and non-pharmacologic comfort measures Outcome: Progressing   Problem: Health Behavior/Discharge Planning: Goal: Ability to manage health-related needs will improve Outcome: Progressing   Problem: Clinical Measurements: Goal: Will remain free from infection Outcome: Progressing Goal: Diagnostic test results will improve Outcome: Progressing Goal: Respiratory complications will improve Outcome: Progressing Goal: Cardiovascular complication will be avoided Outcome: Progressing   Problem: Activity: Goal: Risk for activity intolerance will decrease Outcome: Progressing   Problem: Nutrition: Goal: Adequate nutrition will be maintained Outcome: Progressing   Problem: Coping: Goal: Level of anxiety will decrease Outcome: Progressing   Problem: Elimination: Goal: Will not experience complications related to bowel motility Outcome: Progressing Goal: Will not experience complications related to urinary retention Outcome: Progressing   Problem: Pain Managment: Goal: General experience of comfort will improve and/or be controlled Outcome: Progressing   Problem: Safety: Goal: Ability to remain free from injury will improve Outcome: Progressing   Problem: Skin Integrity: Goal: Risk for impaired skin integrity will decrease Outcome: Progressing   Problem: Education: Goal: Knowledge of vaso-occlusive preventative measures will improve Outcome: Progressing Goal: Awareness of infection prevention will improve Outcome: Progressing   Problem: Bowel/Gastric: Goal: Gut motility will be maintained Outcome: Progressing   Problem: Tissue Perfusion: Goal: Complications related to inadequate tissue perfusion will be avoided or minimized Outcome: Progressing   Problem: Respiratory: Goal: Pulmonary complications will be avoided or  minimized Outcome: Progressing Goal: Acute Chest Syndrome will be identified early to prevent complications Outcome: Progressing   Problem: Fluid Volume: Goal: Ability to maintain a balanced intake and output will improve Outcome: Progressing   Problem: Sensory: Goal: Pain level will decrease with appropriate interventions Outcome: Progressing   Problem: Health Behavior: Goal: Postive changes in compliance with treatment and prescription regimens will improve Outcome: Progressing

## 2023-12-31 NOTE — Discharge Summary (Addendum)
 Physician Discharge Summary  Tina Huber FMW:[id number] DOB: [birth year] DOA: 12/29/2023  PCP: Oley Bascom RAMAN, NP  Admit date: 12/29/2023 Discharge date: 12/31/2023  Admitted From: Home Disposition:  Home  Discharge Condition:Stable CODE STATUS:FULL Diet recommendation: Regular  Brief/Interim Summary: Tina Huber is a 48 y.o. female with medical history significant for sickle cell trait, chronic pain syndrome, asymptomatic HIV infection, rheumatoid arthritis, hypertension and hyperlipidemia who presented to the [facility] for evaluation of worsening bilateral leg pain.  Patient reports that a week ago, she started having left lower leg pain so she attempted to manage it with her home tramadol . The pain improved while she rested at home however when she went back to work on Friday, the pain progressed to her right lower extremity.  She attempted to manage the pain by increasing the tramadol  from 50 to 100 mg yesterday however the pain  continuing to worsen.  On pain exam, she was hemodynamically stable.  She was given IV pain medications. She feels much better today.  Physical therapy does  recommend outpatient follow-up.  Remains medically stable for discharge.  She can continue her home tramadol    Following problems were addressed during the hospitalization:  # Sickle cell pain crisis - Presented with 1 week of progressive bilateral lower extremity pain - Chest imaging negative for acute chest - Given IV pain medication -Sickle cell team throughout this is not sickle cell pain crisis. - Feels much better today.  PT recommended outpatient follow-up - Continue home tramadol   # Constipation # Lower abdominal pain - Reports she has not had any bowel movement over the last 3 days, different from her baseline of 1 bowel movement daily - Mild lower abdominal discomfort on exam but no significant distention - Continue bowel regimen at home.  Abdomen benign on examination   # HTN -  Continue Lopressor    # HLD - Continue atorvastatin    # Asymptomatic HIV - Not on any medications but labs 2 months ago showed undetectable viral load with CD4 count of 995 - Follow-up with [facility] in the outpatient   # Chronic pain syndrome # Fibromyalgia - Continue Lyrica  and as needed tramadol    # Rheumatoid arthritis - Chronic and stable - Follow-up with rheumatology in the outpatient     Discharge Diagnoses:  Principal Problem:   Sickle cell pain crisis (HCC) Active Problems:   Essential hypertension   Fibromyalgia   Mixed hyperlipidemia   Constipation   History of rheumatoid arthritis   History of HIV infection (HCC)    Discharge Instructions  Discharge Instructions     Diet general   Complete by: As directed    Discharge instructions   Complete by: As directed    1) Please take your medications as instructed 2)Follow up with your PCP in a week 3)Follow up with outpatient PT   Increase activity slowly   Complete by: As directed       Allergies as of 12/31/2023       Reactions   Latex    Gloves- made hands itch and burn   Quinine    Other reaction(s): itching Other reaction(s): itching   Quinine Derivatives Itching        Medication List     TAKE these medications    acetaminophen  500 MG tablet Commonly known as: TYLENOL  Take by mouth every 6 (six) hours as needed for mild pain.   albuterol  (2.5 MG/3ML) 0.083% nebulizer solution Commonly known as: PROVENTIL  Take 3 mLs (2.5 mg total)  by nebulization every 4 (four) hours as needed for wheezing or shortness of breath.   albuterol  108 (90 Base) MCG/ACT inhaler Commonly known as: VENTOLIN  HFA Inhale 1 puff into the lungs every 4 (four) hours as needed for wheezing or shortness of breath.   atorvastatin  10 MG tablet Commonly known as: Lipitor Take 1 tablet (10 mg total) by mouth daily.   Aviane 0.1-20 MG-MCG tablet Generic drug: levonorgestrel-ethinyl estradiol  Take 1 tablet by mouth  daily.   celecoxib 200 MG capsule Commonly known as: CELEBREX 1 capsule PO twice a day for 90 days   cholecalciferol 25 MCG (1000 UNIT) tablet Commonly known as: VITAMIN D3 Take 1,000 Units by mouth daily.   cyclobenzaprine  10 MG tablet Commonly known as: FLEXERIL  TAKE 1 TABLET BY MOUTH AT BEDTIME   diclofenac  Sodium 1 % Gel Commonly known as: Voltaren  Arthritis Pain Apply 2 g topically 2 (two) times daily as needed (pain).   folic acid  1 MG tablet Commonly known as: FOLVITE  Take 1 mg by mouth daily.   IRON COMPLEX PO Take 1 tablet by mouth daily.   lidocaine  4 % Apply 1 patch topically daily. What changed:  when to take this reasons to take this   metoprolol  tartrate 25 MG tablet Commonly known as: LOPRESSOR  Take 1/2 (one-half) tablet by mouth twice daily   multivitamin with minerals tablet Take 1 tablet by mouth daily.   omeprazole  40 MG capsule Commonly known as: PRILOSEC Take 1 capsule (40 mg total) by mouth 2 (two) times daily.   ondansetron  4 MG disintegrating tablet Commonly known as: ZOFRAN -ODT Take 1 tablet (4 mg total) by mouth every 8 (eight) hours as needed.   polyethylene glycol 17 g packet Commonly known as: MIRALAX  / GLYCOLAX  Take 17 g by mouth daily.   pregabalin  100 MG capsule Commonly known as: LYRICA  Take 1 capsule (100 mg total) by mouth 3 (three) times daily.   senna 8.6 MG Tabs tablet Commonly known as: SENOKOT Take 1 tablet (8.6 mg total) by mouth 2 (two) times daily for 7 days.   traMADol  50 MG tablet Commonly known as: ULTRAM  Take 1-2 tablets (50-100 mg total) by mouth every 6 (six) hours as needed. What changed: reasons to take this        Follow-up Information     Oley Bascom RAMAN, NP. Schedule an appointment as soon as possible for a visit in 1 week(s).   Specialties: Pulmonary Disease, Endocrinology Contact information: [address] [place] [address] [phone]                Allergies   Allergen Reactions   Latex     Gloves- made hands itch and burn   Quinine     Other reaction(s): itching Other reaction(s): itching   Quinine Derivatives Itching    Consultations: None   Procedures/Studies: DG Abd 1 View Result Date: 12/30/2023 EXAM: 1 VIEW XRAY OF THE ABDOMEN 12/30/2023 01:39:00 AM COMPARISON: None available. CLINICAL HISTORY: Abdominal pain FINDINGS: BOWEL: Moderate stool within right colon. Nonobstructive bowel gas pattern. SOFT TISSUES: Pelvic phleboliths noted. No opaque urinary calculi. BONES: No acute osseous abnormality. IMPRESSION: 1. Moderate stool within right colon.Nonobstructive bowel gas pattern. Electronically signed by: Norman Gatlin MD 12/30/2023 01:48 AM EST RP Workstation: [id number]   DG Chest Port 1 View Result Date: 12/29/2023 CLINICAL DATA:  Cough EXAM: PORTABLE CHEST 1 VIEW COMPARISON:  Chest x-ray 07/27/2021 FINDINGS: The heart size and mediastinal contours are within normal limits. Both lungs  are clear. The visualized skeletal structures are unremarkable. IMPRESSION: No active disease. Electronically Signed   By: Greig Pique M.D.   On: 12/29/2023 21:44      Subjective: Patient seen and examined at bedside today.  Hemodynamically stable.  Comfortable this morning.  Feels much better.  Pain in the bilateral lower extremities significantly improved.  Feels ready to go home today.  Discharge Exam: Vitals:   12/31/23 0940 12/31/23 0941  BP: 107/66 107/66  Pulse: 100 100  Resp:  12  Temp:  97.8 F (36.6 C)  SpO2:  99%   Vitals:   12/30/23 2128 12/31/23 0519 12/31/23 0940 12/31/23 0941  BP: 139/85 122/80 107/66 107/66  Pulse: 93 84 100 100  Resp:  18  12  Temp:  98.1 F (36.7 C)  97.8 F (36.6 C)  TempSrc:  Oral  Oral  SpO2:  98%  99%  Weight:      Height:        General: Pt is alert, awake, not in acute distress Cardiovascular: RRR, S1/S2 +, no rubs, no gallops Respiratory: CTA bilaterally, no wheezing, no  rhonchi Abdominal: Soft, NT, ND, bowel sounds + Extremities: no edema, no cyanosis    The results of significant diagnostics from this hospitalization (including imaging, microbiology, ancillary and laboratory) are listed below for reference.     Microbiology: Recent Results (from the past 240 hours)  Resp panel by RT-PCR (RSV, Flu A&B, Covid) Anterior Nasal Swab     Status: None   Collection Time: 12/29/23  9:15 PM   Specimen: Anterior Nasal Swab  Result Value Ref Range Status   SARS Coronavirus 2 by RT PCR NEGATIVE NEGATIVE Final    Comment: (NOTE) SARS-CoV-2 target nucleic acids are NOT DETECTED.  The SARS-CoV-2 RNA is generally detectable in upper respiratory specimens during the acute phase of infection. The lowest concentration of SARS-CoV-2 viral copies this assay can detect is 138 copies/mL. A negative result does not preclude SARS-Cov-2 infection and should not be used as the sole basis for treatment or other patient management decisions. A negative result may occur with  improper specimen collection/handling, submission of specimen other than nasopharyngeal swab, presence of viral mutation(s) within the areas targeted by this assay, and inadequate number of viral copies(<138 copies/mL). A negative result must be combined with clinical observations, patient history, and epidemiological information. The expected result is Negative.  Fact Sheet for Patients:  bloggercourse.com  Fact Sheet for Healthcare Providers:  seriousbroker.it  This test is no t yet approved or cleared by the [place]  FDA and  has been authorized for detection and/or diagnosis of SARS-CoV-2 by FDA under an Emergency Use Authorization (EUA). This EUA will remain  in effect (meaning this test can be used) for the duration of the COVID-19 declaration under Section 564(b)(1) of the Act, 21 U.S.C.section 360bbb-3(b)(1), unless the authorization is  terminated  or revoked sooner.       Influenza A by PCR NEGATIVE NEGATIVE Final   Influenza B by PCR NEGATIVE NEGATIVE Final    Comment: (NOTE) The Xpert Xpress SARS-CoV-2/FLU/RSV plus assay is intended as an aid in the diagnosis of influenza from Nasopharyngeal swab specimens and should not be used as a sole basis for treatment. Nasal washings and aspirates are unacceptable for Xpert Xpress SARS-CoV-2/FLU/RSV testing.  Fact Sheet for Patients: bloggercourse.com  Fact Sheet for Healthcare Providers: seriousbroker.it  This test is not yet approved or cleared by the [place]  FDA and has been authorized for detection  and/or diagnosis of SARS-CoV-2 by FDA under an Emergency Use Authorization (EUA). This EUA will remain in effect (meaning this test can be used) for the duration of the COVID-19 declaration under Section 564(b)(1) of the Act, 21 U.S.C. section 360bbb-3(b)(1), unless the authorization is terminated or revoked.     Resp Syncytial Virus by PCR NEGATIVE NEGATIVE Final    Comment: (NOTE) Fact Sheet for Patients: bloggercourse.com  Fact Sheet for Healthcare Providers: seriousbroker.it  This test is not yet approved or cleared by the [place]  FDA and has been authorized for detection and/or diagnosis of SARS-CoV-2 by FDA under an Emergency Use Authorization (EUA). This EUA will remain in effect (meaning this test can be used) for the duration of the COVID-19 declaration under Section 564(b)(1) of the Act, 21 U.S.C. section 360bbb-3(b)(1), unless the authorization is terminated or revoked.  Performed at [facility], [address] [place], [place] [address]      Labs: BNP (last 3 results) No results for input(s): BNP in the last 8760 hours. Basic Metabolic Panel: Recent Labs  Lab 12/29/23 2050 12/30/23 0449  NA 137 136  K 4.2  3.8  CL 101 101  CO2 26 25  GLUCOSE 129* 135*  BUN 14 15  CREATININE 0.75 0.73  CALCIUM  9.8 9.1   Liver Function Tests: Recent Labs  Lab 12/29/23 2050  AST 29  ALT 27  ALKPHOS 94  BILITOT 0.3  PROT 8.3*  ALBUMIN 4.2   No results for input(s): LIPASE, AMYLASE in the last 168 hours. No results for input(s): AMMONIA in the last 168 hours. CBC: Recent Labs  Lab 12/29/23 2050 12/30/23 0449  WBC 5.1 5.2  NEUTROABS 1.8  --   HGB 12.9 12.5  HCT 36.6 38.3  MCV 84.3 88.0  PLT 249 246   Cardiac Enzymes: No results for input(s): CKTOTAL, CKMB, CKMBINDEX, TROPONINI in the last 168 hours. BNP: Invalid input(s): POCBNP CBG: No results for input(s): GLUCAP in the last 168 hours. D-Dimer No results for input(s): DDIMER in the last 72 hours. Hgb A1c No results for input(s): HGBA1C in the last 72 hours. Lipid Profile No results for input(s): CHOL, HDL, LDLCALC, TRIG, CHOLHDL, LDLDIRECT in the last 72 hours. Thyroid  function studies No results for input(s): TSH, T4TOTAL, T3FREE, THYROIDAB in the last 72 hours.  Invalid input(s): FREET3 Anemia work up Recent Labs    12/29/23 2050  RETICCTPCT 1.5   Urinalysis    Component Value Date/Time   COLORURINE YELLOW 02/19/2023 0504   APPEARANCEUR CLEAR 02/19/2023 0504   LABSPEC 1.017 02/19/2023 0504   LABSPEC 1.020 11/23/2021 0930   PHURINE 5.0 02/19/2023 0504   GLUCOSEU NEGATIVE 02/19/2023 0504   HGBUR NEGATIVE 02/19/2023 0504   BILIRUBINUR NEGATIVE 02/19/2023 0504   BILIRUBINUR negative 11/23/2021 0930   BILIRUBINUR neg 07/24/2019 0839   KETONESUR NEGATIVE 02/19/2023 0504   PROTEINUR NEGATIVE 02/19/2023 0504   UROBILINOGEN 0.2 06/29/2021 1332   UROBILINOGEN 0.2 01/09/2017 1154   NITRITE NEGATIVE 02/19/2023 0504   LEUKOCYTESUR NEGATIVE 02/19/2023 0504   Sepsis Labs Recent Labs  Lab 12/29/23 2050 12/30/23 0449  WBC 5.1 5.2   Microbiology Recent Results (from the past 240  hours)  Resp panel by RT-PCR (RSV, Flu A&B, Covid) Anterior Nasal Swab     Status: None   Collection Time: 12/29/23  9:15 PM   Specimen: Anterior Nasal Swab  Result Value Ref Range Status   SARS Coronavirus 2 by RT PCR NEGATIVE NEGATIVE Final    Comment: (NOTE)  SARS-CoV-2 target nucleic acids are NOT DETECTED.  The SARS-CoV-2 RNA is generally detectable in upper respiratory specimens during the acute phase of infection. The lowest concentration of SARS-CoV-2 viral copies this assay can detect is 138 copies/mL. A negative result does not preclude SARS-Cov-2 infection and should not be used as the sole basis for treatment or other patient management decisions. A negative result may occur with  improper specimen collection/handling, submission of specimen other than nasopharyngeal swab, presence of viral mutation(s) within the areas targeted by this assay, and inadequate number of viral copies(<138 copies/mL). A negative result must be combined with clinical observations, patient history, and epidemiological information. The expected result is Negative.  Fact Sheet for Patients:  bloggercourse.com  Fact Sheet for Healthcare Providers:  seriousbroker.it  This test is no t yet approved or cleared by the [place]  FDA and  has been authorized for detection and/or diagnosis of SARS-CoV-2 by FDA under an Emergency Use Authorization (EUA). This EUA will remain  in effect (meaning this test can be used) for the duration of the COVID-19 declaration under Section 564(b)(1) of the Act, 21 U.S.C.section 360bbb-3(b)(1), unless the authorization is terminated  or revoked sooner.       Influenza A by PCR NEGATIVE NEGATIVE Final   Influenza B by PCR NEGATIVE NEGATIVE Final    Comment: (NOTE) The Xpert Xpress SARS-CoV-2/FLU/RSV plus assay is intended as an aid in the diagnosis of influenza from Nasopharyngeal swab specimens and should not  be used as a sole basis for treatment. Nasal washings and aspirates are unacceptable for Xpert Xpress SARS-CoV-2/FLU/RSV testing.  Fact Sheet for Patients: bloggercourse.com  Fact Sheet for Healthcare Providers: seriousbroker.it  This test is not yet approved or cleared by the [place]  FDA and has been authorized for detection and/or diagnosis of SARS-CoV-2 by FDA under an Emergency Use Authorization (EUA). This EUA will remain in effect (meaning this test can be used) for the duration of the COVID-19 declaration under Section 564(b)(1) of the Act, 21 U.S.C. section 360bbb-3(b)(1), unless the authorization is terminated or revoked.     Resp Syncytial Virus by PCR NEGATIVE NEGATIVE Final    Comment: (NOTE) Fact Sheet for Patients: bloggercourse.com  Fact Sheet for Healthcare Providers: seriousbroker.it  This test is not yet approved or cleared by the [place]  FDA and has been authorized for detection and/or diagnosis of SARS-CoV-2 by FDA under an Emergency Use Authorization (EUA). This EUA will remain in effect (meaning this test can be used) for the duration of the COVID-19 declaration under Section 564(b)(1) of the Act, 21 U.S.C. section 360bbb-3(b)(1), unless the authorization is terminated or revoked.  Performed at [facility], [address], [place], [place] [address]     Please note: You were cared for by a hospitalist during your hospital stay. Once you are discharged, your primary care physician will handle any further medical issues. Please note that NO REFILLS for any discharge medications will be authorized once you are discharged, as it is imperative that you return to your primary care physician (or establish a relationship with a primary care physician if you do not have one) for your post hospital discharge needs so that they can  reassess your need for medications and monitor your lab values.    Time coordinating discharge: 40 minutes  SIGNED:   Ivonne Mustache, MD  [facility] 12/31/2023, 12:08 PM Pager [phone]  If 7PM-7AM, please contact night-coverage [email or web address] Password [email or web address]

## 2023-12-31 NOTE — Evaluation (Signed)
 Physical Therapy Evaluation Patient Details Name: Tina Huber MRN: [id number] DOB: 09/07/1975 Today's Date: 12/31/2023  History of Present Illness  48 yo female presents to therapy following hospitalization on 12/29/2023 due to B LE pain and abdominal discomfort. B LE pain attributed to vaso-occlusive crisis vs sickle cell dz compounded by RA. Pt has PMH includes but is not limited to: RA, chronic pain, sickle cell trait, fibromyalgia, asymptomatic HIV, HTN, and HLD.  Clinical Impression      Pt admitted with above diagnosis.  Pt currently with functional limitations due to the deficits listed below (see PT Problem List). Pt in bed when PT arrived. Pt agreeable to therapy intervention. Pt reports the pain is getting better, pt states R LE > L LE and 6/10. Pt is mod I for bed mobility, S for transfer tasks no AD no, overt LOB and minimal cues, gait tasks without AD in personal room 20 feet and in hallway 90 feet with one therapeutic standing rest break, CGA for safety with pt noted to have lateral sway and decreased R LE stance time, min cues for safety, step navigation with L handrail, HHA and min A with cues for safety, sequencing and step to technique. Pt expressed concern per pain management in home setting with no resolve with tramadol  prior to hospital admission. Pt is motivated to d/c home with family support and recourses for pain management. Pt left seated in recliner, all needs in place.  Pt will benefit from acute skilled PT to increase their independence and safety with mobility to allow discharge.       If plan is discharge home, recommend the following: A little help with walking and/or transfers;A little help with bathing/dressing/bathroom;Assistance with cooking/housework;Assist for transportation;Help with stairs or ramp for entrance   Can travel by private vehicle        Equipment Recommendations None recommended by PT  Recommendations for Other Services       Functional  Status Assessment Patient has had a recent decline in their functional status and demonstrates the ability to make significant improvements in function in a reasonable and predictable amount of time.     Precautions / Restrictions Precautions Precautions: Fall Restrictions Weight Bearing Restrictions Per Provider Order: No      Mobility  Bed Mobility Overal bed mobility: Modified Independent             General bed mobility comments: increased time and use of hospital bed features    Transfers Overall transfer level: Needs assistance Equipment used: None Transfers: Sit to/from Stand Sit to Stand: Supervision           General transfer comment: min cues S for bed and commode transfers no overt LOB    Ambulation/Gait Ambulation/Gait assistance: Contact guard assist Gait Distance (Feet): 90 Feet Assistive device: None Gait Pattern/deviations: Step-to pattern, Antalgic Gait velocity: decreased     General Gait Details: lateral sway with decreased stance time on R LE short stride length and limited foot clearance, pt required theraputic standing rest break  Stairs Stairs: Yes Stairs assistance: Min assist Stair Management: One rail Left Number of Stairs: 4 General stair comments: min A, HHA and L handrail with cues for step to pattern asending with L LE and descending with R LE due to increased R LE pain  Wheelchair Mobility     Tilt Bed    Modified Rankin (Stroke Patients Only)       Balance Overall balance assessment: Mild deficits observed, not formally tested  Pertinent Vitals/Pain Pain Assessment Pain Assessment: 0-10 Pain Score: 6  Pain Location: B LE Pain Descriptors / Indicators: Aching, Constant, Discomfort, Grimacing, Guarding Pain Intervention(s): Limited activity within patient's tolerance, Monitored during session, Premedicated before session, Repositioned    Home Living  Family/patient expects to be discharged to:: Private residence Living Arrangements: Spouse/significant other;Children Available Help at Discharge: Family Type of Home: House Home Access: Stairs to enter Entrance Stairs-Rails: None Entrance Stairs-Number of Steps: 4 Alternate Level Stairs-Number of Steps: flight Home Layout: Two level;Bed/bath upstairs Home Equipment: None      Prior Function Prior Level of Function : Independent/Modified Independent;Driving;Working/employed             Mobility Comments: IND no AD for all ADLs, self care tasks and IADLs ADLs 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     Extremity/Trunk Assessment        Lower Extremity Assessment Lower Extremity Assessment: Generalized weakness (prior R knee surgery)    Cervical / Trunk Assessment Cervical / Trunk Assessment: Normal  Communication   Communication Communication: No apparent difficulties    Cognition Arousal: Alert Behavior During Therapy: WFL for tasks assessed/performed   PT - Cognitive impairments: No apparent impairments                         Following commands: Intact       Cueing       General Comments      Exercises     Assessment/Plan    PT Assessment Patient needs continued PT services  PT Problem List Decreased activity tolerance;Decreased balance;Decreased mobility;Pain       PT Treatment Interventions Gait training;Stair training;Functional mobility training;Therapeutic activities;Therapeutic exercise;Balance training;Neuromuscular re-education;Patient/family education    PT Goals (Current goals can be found in the Care Plan section)  Acute Rehab PT Goals Patient Stated Goal: to go home today PT Goal Formulation: With patient Time For Goal Achievement: 01/14/24 Potential to Achieve Goals: Good    Frequency Min 3X/week     Co-evaluation               AM-PAC PT 6 Clicks Mobility  Outcome Measure Help needed turning from  your back to your side while in a flat bed without using bedrails?: None Help needed moving from lying on your back to sitting on the side of a flat bed without using bedrails?: None Help needed moving to and from a bed to a chair (including a wheelchair)?: A Little Help needed standing up from a chair using your arms (e.g., wheelchair or bedside chair)?: A Little Help needed to walk in hospital room?: A Little Help needed climbing 3-5 steps with a railing? : A Little 6 Click Score: 20    End of Session Equipment Utilized During Treatment: Gait belt Activity Tolerance: Patient limited by pain;Patient limited by fatigue Patient left: in chair;with call bell/phone within reach Nurse Communication: Mobility status PT Visit Diagnosis: Unsteadiness on feet (R26.81);Other abnormalities of gait and mobility (R26.89);Pain;Difficulty in walking, not elsewhere classified (R26.2) Pain - Right/Left:  (B) Pain - part of body: Leg;Hip;Knee    Time: 1045-1110 PT Time Calculation (min) (ACUTE ONLY): 25 min   Charges:   PT Evaluation $PT Eval Low Complexity: 1 Low PT Treatments $Gait Training: 8-22 mins PT General Charges $$ ACUTE PT VISIT: 1 Visit         [place], PT Acute Rehab   [place] VEAR Drone 12/31/2023, 11:23 AM

## 2023-12-31 NOTE — Plan of Care (Signed)
 Patient discharged home with husband.  Problem: Education: Goal: Knowledge of General Education information will improve Description: Including pain rating scale, medication(s)/side effects and non-pharmacologic comfort measures 12/31/2023 1219 by Leigh Riggs A, LPN Outcome: Adequate for Discharge 12/31/2023 1219 by Leigh Riggs A, LPN Outcome: Progressing   Problem: Health Behavior/Discharge Planning: Goal: Ability to manage health-related needs will improve 12/31/2023 1219 by Leigh Riggs A, LPN Outcome: Adequate for Discharge 12/31/2023 1219 by Leigh Riggs A, LPN Outcome: Progressing   Problem: Clinical Measurements: Goal: Will remain free from infection 12/31/2023 1219 by Leigh Riggs A, LPN Outcome: Adequate for Discharge 12/31/2023 1219 by Leigh Riggs A, LPN Outcome: Progressing Goal: Diagnostic test results will improve 12/31/2023 1219 by Leigh Riggs A, LPN Outcome: Adequate for Discharge 12/31/2023 1219 by Leigh Riggs A, LPN Outcome: Progressing Goal: Respiratory complications will improve 12/31/2023 1219 by Leigh Riggs A, LPN Outcome: Adequate for Discharge 12/31/2023 1219 by Leigh Riggs A, LPN Outcome: Progressing Goal: Cardiovascular complication will be avoided 12/31/2023 1219 by Leigh Riggs A, LPN Outcome: Adequate for Discharge 12/31/2023 1219 by Leigh Riggs A, LPN Outcome: Progressing   Problem: Activity: Goal: Risk for activity intolerance will decrease 12/31/2023 1219 by Leigh Riggs A, LPN Outcome: Adequate for Discharge 12/31/2023 1219 by Leigh Riggs A, LPN Outcome: Progressing   Problem: Nutrition: Goal: Adequate nutrition will be maintained 12/31/2023 1219 by Leigh Riggs A, LPN Outcome: Adequate for Discharge 12/31/2023 1219 by Leigh Riggs A, LPN Outcome: Progressing   Problem: Coping: Goal: Level of anxiety will decrease 12/31/2023 1219 by Leigh Riggs A, LPN Outcome: Adequate for Discharge 12/31/2023 1219 by Leigh Riggs A,  LPN Outcome: Progressing   Problem: Elimination: Goal: Will not experience complications related to bowel motility 12/31/2023 1219 by Leigh Riggs A, LPN Outcome: Adequate for Discharge 12/31/2023 1219 by Leigh Riggs A, LPN Outcome: Progressing Goal: Will not experience complications related to urinary retention 12/31/2023 1219 by Leigh Riggs A, LPN Outcome: Adequate for Discharge 12/31/2023 1219 by Leigh Riggs A, LPN Outcome: Progressing   Problem: Pain Managment: Goal: General experience of comfort will improve and/or be controlled 12/31/2023 1219 by Leigh Riggs A, LPN Outcome: Adequate for Discharge 12/31/2023 1219 by Leigh Riggs A, LPN Outcome: Progressing   Problem: Safety: Goal: Ability to remain free from injury will improve 12/31/2023 1219 by Leigh Riggs A, LPN Outcome: Adequate for Discharge 12/31/2023 1219 by Leigh Riggs A, LPN Outcome: Progressing   Problem: Skin Integrity: Goal: Risk for impaired skin integrity will decrease 12/31/2023 1219 by Leigh Riggs A, LPN Outcome: Adequate for Discharge 12/31/2023 1219 by Leigh Riggs A, LPN Outcome: Progressing   Problem: Education: Goal: Knowledge of vaso-occlusive preventative measures will improve 12/31/2023 1219 by Leigh Riggs A, LPN Outcome: Adequate for Discharge 12/31/2023 1219 by Leigh Riggs A, LPN Outcome: Progressing Goal: Awareness of infection prevention will improve 12/31/2023 1219 by Leigh Riggs A, LPN Outcome: Adequate for Discharge 12/31/2023 1219 by Leigh Riggs A, LPN Outcome: Progressing   Problem: Bowel/Gastric: Goal: Gut motility will be maintained 12/31/2023 1219 by Leigh Riggs A, LPN Outcome: Adequate for Discharge 12/31/2023 1219 by Leigh Riggs A, LPN Outcome: Progressing   Problem: Tissue Perfusion: Goal: Complications related to inadequate tissue perfusion will be avoided or minimized 12/31/2023 1219 by Leigh Riggs A, LPN Outcome: Adequate for Discharge 12/31/2023  1219 by Leigh Riggs A, LPN Outcome: Progressing   Problem: Respiratory: Goal: Pulmonary complications will be avoided or minimized 12/31/2023 1219 by Leigh Riggs A, LPN Outcome: Adequate for Discharge 12/31/2023 1219 by Leigh Riggs LABOR,  LPN Outcome: Progressing Goal: Acute Chest Syndrome will be identified early to prevent complications 12/31/2023 1219 by Leigh Riggs A, LPN Outcome: Adequate for Discharge 12/31/2023 1219 by Leigh Riggs A, LPN Outcome: Progressing   Problem: Fluid Volume: Goal: Ability to maintain a balanced intake and output will improve 12/31/2023 1219 by Leigh Riggs A, LPN Outcome: Adequate for Discharge 12/31/2023 1219 by Leigh Riggs A, LPN Outcome: Progressing   Problem: Sensory: Goal: Pain level will decrease with appropriate interventions 12/31/2023 1219 by Leigh Riggs A, LPN Outcome: Adequate for Discharge 12/31/2023 1219 by Leigh Riggs A, LPN Outcome: Progressing   Problem: Health Behavior: Goal: Postive changes in compliance with treatment and prescription regimens will improve 12/31/2023 1219 by Leigh Riggs A, LPN Outcome: Adequate for Discharge 12/31/2023 1219 by Leigh Riggs A, LPN Outcome: Progressing   Problem: Acute Rehab PT Goals(only PT should resolve) Goal: Pt Will Transfer Bed To Chair/Chair To Bed Outcome: Adequate for Discharge Goal: Pt Will Ambulate Outcome: Adequate for Discharge Goal: Pt Will Go Up/Down Stairs Outcome: Adequate for Discharge Goal: Pt/caregiver will Perform Home Exercise Program Outcome: Adequate for Discharge

## 2024-01-01 ENCOUNTER — Telehealth: Payer: Self-pay

## 2024-01-01 NOTE — Transitions of Care (Post Inpatient/ED Visit) (Signed)
   01/01/2024  Name: Tina Huber MRN: [id number] DOB: September 20, 1975  Today's TOC FU Call Status: Today's TOC FU Call Status:: Unsuccessful Call (1st Attempt) Unsuccessful Call (1st Attempt) Date: 01/01/24  Attempted to reach the patient regarding the most recent Inpatient/ED visit.  Follow Up Plan: Additional outreach attempts will be made to reach the patient to complete the Transitions of Care (Post Inpatient/ED visit) call.   Arvin Seip RN, BSN, CCM [employer], Population Health Case Manager Phone: [phone]

## 2024-01-02 ENCOUNTER — Telehealth: Payer: Self-pay

## 2024-01-02 NOTE — Transitions of Care (Post Inpatient/ED Visit) (Signed)
   01/02/2024  Name: Tina Huber MRN: [id number] DOB: 11-15-1975  Today's TOC FU Call Status: Today's TOC FU Call Status:: Unsuccessful Call (2nd Attempt) Unsuccessful Call (2nd Attempt) Date: 01/02/24  Attempted to reach the patient regarding the most recent Inpatient/ED visit.  Follow Up Plan: Additional outreach attempts will be made to reach the patient to complete the Transitions of Care (Post Inpatient/ED visit) call.   Arvin Seip RN, BSN, CCM [employer], Population Health Case Manager Phone: [phone]

## 2024-01-03 ENCOUNTER — Telehealth: Payer: Self-pay

## 2024-01-03 NOTE — Transitions of Care (Post Inpatient/ED Visit) (Signed)
   01/03/2024  Name: Tina Huber MRN: [id number] DOB: 11-11-1975  Today's TOC FU Call Status: Today's TOC FU Call Status:: Unsuccessful Call (3rd Attempt) Unsuccessful Call (3rd Attempt) Date: 01/03/24  Attempted to reach the patient regarding the most recent Inpatient/ED visit.  Follow Up Plan: No further outreach attempts will be made at this time. We have been unable to contact the patient.  Arvin Seip RN, BSN, CCM [employer], Population Health Case Manager Phone: [phone]

## 2024-01-03 NOTE — Patient Instructions (Signed)
 Visit Information  Thank you for taking time to visit with me today. Please don't hesitate to contact me if I can be of assistance to you before our next scheduled telephone appointment.  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 Discussed plans with patient for ongoing care management follow up and provided patient with direct contact information for care management team Reviewed medications with patient and discussed importance of compliance Reviewed scheduled/upcoming provider appointments including scheduling patient hospital follow up visit with primary care provider.  Assessed social determinant of health barriers Notified patients primary care provider of enrollment in 30 day TOC program. Advised patient to follow up with [facility] RN case manager for 30 day TOC program.  Advised patient to:  Stay hydrated - drink plenty of water Avoid extreme temperatures - heat/ cold ( avoid sudden changes in temperature such as jumping into cold water. Eat a balanced diet rich in fruits, vegetables, whole grains, and lean proteins Take medications as prescribed Follow up with your providers regularly and as recommended Consider doing regular gentle exercise such as walking Call MD for severe uncontrolled pain Call MD for temperature  >100 Increase activity slowly Call patient care center [phone] for new or ongoing symptoms/ concerns   Patient Self Care Activities:  Attend all scheduled provider appointments Notify RN Care Manager of TOC call rescheduling needs Participate in Transition of Care Program/Attend TOC scheduled calls Take medications as prescribed   Stay hydrated - drink plenty of water Avoid extreme temperatures - heat/ cold ( avoid sudden changes in temperature such as jumping into cold water. Eat a balanced diet rich in fruits, vegetables, whole grains, and lean proteins Take medications as prescribed Follow up with your providers regularly and as recommended Consider doing regular gentle exercise such as walking Call MD for severe uncontrolled pain Call MD for temperature >100 Increase activity slowly Call patient care center [phone] for new or ongoing symptoms/ concerns  Plan:  Telephone follow up appointment with care management team member scheduled for:  01/09/24 at 1 pm The patient has been provided with contact information for the care management team and has been advised to call with any health related questions or concerns.         Patient verbalizes understanding of instructions and care plan provided today and agrees to view in MyChart. Active MyChart status and patient understanding of how to access instructions and care plan via MyChart confirmed with patient.     The patient has been provided with contact information for the care management team and has been advised to call with any health related questions or concerns.   Please call the care guide team at [phone] if you need to cancel or reschedule your appointment.  Please call the Suicide and Crisis Lifeline: 988 call the [place]  National Suicide Prevention Lifeline: 601-448-1942 or TTY: 619 694 5760 TTY 7322005207) to talk to a trained counselor call 1-800-273-TALK (toll free, 24 hour hotline) if you are  experiencing a Mental Health or Behavioral Health Crisis or need someone to talk to.  Arvin Seip RN, BSN, CCM [employer], Population Health Case Manager Phone: [phone]

## 2024-01-03 NOTE — Transitions of Care (Post Inpatient/ED Visit) (Signed)
 01/03/2024  Name: Tina Huber MRN: [id number] DOB: April 11, 1975  Today's TOC FU Call Status: Today's TOC FU Call Status:: Successful TOC FU Call Completed TOC FU Call Complete Date: 01/03/24  Patient's Name and Date of Birth confirmed. Name, DOB  Transition Care Management Follow-up Telephone Call Date of Discharge: 12/31/23 Discharge Facility: [facility]) Type of Discharge: Inpatient Admission Primary Inpatient Discharge Diagnosis:: sickle cell pain crisis How have you been since you were released from the hospital?: Better Any questions or concerns?: Yes Patient Questions/Concerns:: patient states she doesn't have a follow up appointment with her primary care provider.  Patient states she was advised to have outpatient PT however she doesn't know who to call about this. Patient Questions/Concerns Addressed: Notified Provider of Patient Questions/Concerns  Items Reviewed: Did you receive and understand the discharge instructions provided?: Yes Medications obtained,verified, and reconciled?: Yes (Medications Reviewed) Any new allergies since your discharge?: No Dietary orders reviewed?: Yes Type of Diet Ordered:: regular Do you have support at home?: Yes People in Home [RPT]: spouse Name of Support/Comfort Primary Source: Ginelle Macenet  Medications Reviewed Today: Medications Reviewed Today     Reviewed by Xayne Brumbaugh E, RN (Registered Nurse) on 01/03/24 at 1009  Med List Status: <None>   Medication Order Taking? Sig Documenting Provider Last Dose Status Informant  acetaminophen  (TYLENOL ) 500 MG tablet [id number] Yes Take by mouth every 6 (six) hours as needed for mild pain. [provider]  Active Self           Med Note JACKOLYN WADDELL VEAR Pablo Dec 30, 2023  9:10 AM)    albuterol  (PROVENTIL ) (2.5 MG/3ML) 0.083% nebulizer solution [id number] Yes Take 3 mLs (2.5 mg total) by nebulization every 4 (four) hours as needed for wheezing or shortness of breath.  Oley Bascom RAMAN, NP  Active Self  albuterol  (VENTOLIN  HFA) 108 (90 Base) MCG/ACT inhaler [id number] Yes Inhale 1 puff into the lungs every 4 (four) hours as needed for wheezing or shortness of breath. Oley Bascom RAMAN, NP  Active Self  atorvastatin  (LIPITOR) 10 MG tablet [id number] Yes Take 1 tablet (10 mg total) by mouth daily. Oley Bascom RAMAN, NP  Active Self  AVIANE 0.1-20 MG-MCG tablet [id number] Yes Take 1 tablet by mouth daily. [provider]  Active Self  celecoxib (CELEBREX) 200 MG capsule [id number] Yes 1 capsule PO twice a day for 90 days [provider]  Active Self  cholecalciferol  (VITAMIN D3) 25 MCG (1000 UNIT) tablet [id number] Yes Take 1,000 Units by mouth daily. [provider]  Active Self  cyclobenzaprine  (FLEXERIL ) 10 MG tablet [id number] Yes TAKE 1 TABLET BY MOUTH AT BEDTIME Nichols, Tonya S, NP  Active Self  diclofenac  Sodium (VOLTAREN  ARTHRITIS PAIN) 1 % GEL [id number] Yes Apply 2 g topically 2 (two) times daily as needed (pain). Oley Bascom RAMAN, NP  Active Self  folic acid  (FOLVITE ) 1 MG tablet [id number] Yes Take 1 mg by mouth daily. [provider]  Active Self  Iron Combinations (IRON COMPLEX PO) [id number] Yes Take 1 tablet by mouth daily. [provider]  Active Self  Lidocaine  4 % PTCH [id number] Yes Apply 1 patch topically daily.  Patient taking differently: Apply 1 patch topically daily as needed (for pain).   Shannan Christain FERNS, NP  Active Self           Med Note JACKOLYN WADDELL VEAR Pablo Dec 30, 2023  9:10 AM)    metoprolol  tartrate (LOPRESSOR ) 25 MG tablet  [id number] Yes Take 1/2 (one-half) tablet by mouth twice daily Nichols, Tonya S, NP  Active Self  Multiple Vitamins-Minerals (MULTIVITAMIN WITH MINERALS) tablet [id number] Yes Take 1 tablet by mouth daily. [provider]  Active Self           Med Note DONNAJEAN, [facility] P   Thu Jul 27, 2021  7:23 AM)    omeprazole  (PRILOSEC) 40 MG capsule [id number] Yes Take 1  capsule (40 mg total) by mouth 2 (two) times daily. Oley Bascom RAMAN, NP  Active Self  ondansetron  (ZOFRAN -ODT) 4 MG disintegrating tablet [id number] Yes Take 1 tablet (4 mg total) by mouth every 8 (eight) hours as needed. Oley Bascom RAMAN, NP  Active Self  polyethylene glycol (MIRALAX  / GLYCOLAX ) 17 g packet [id number] Yes Take 17 g by mouth daily. Jillian Buttery, MD  Active   pregabalin  (LYRICA ) 100 MG capsule [id number] Yes Take 1 capsule (100 mg total) by mouth 3 (three) times daily. Urbano Albright, MD  Active Self  senna (SENOKOT) 8.6 MG TABS tablet [id number] Yes Take 1 tablet (8.6 mg total) by mouth 2 (two) times daily for 7 days. Jillian Buttery, MD  Active   traMADol  (ULTRAM ) 50 MG tablet [id number] Yes Take 1-2 tablets (50-100 mg total) by mouth every 6 (six) hours as needed.  Patient taking differently: Take 50-100 mg by mouth every 6 (six) hours as needed for moderate pain (pain score 4-6).   Urbano Albright, MD  Active Self           Med Note JACKOLYN WADDELL VEAR Pablo Dec 30, 2023  9:12 AM)              Home Care and Equipment/Supplies: Were Home Health Services Ordered?: No Any new equipment or medical supplies ordered?: No  Functional Questionnaire: Do you need assistance with bathing/showering or dressing?: No Do you need assistance with meal preparation?: No Do you need assistance with eating?: No Do you have difficulty maintaining continence: No Do you need assistance with getting out of bed/getting out of a chair/moving?: No Do you have difficulty managing or taking your medications?: No  Follow up appointments reviewed: PCP Follow-up appointment confirmed?: Yes Date of PCP follow-up appointment?: 01/08/24 Follow-up Provider: Bascom Oley, np Specialist [facility] Follow-up appointment confirmed?: NA Do you need transportation to your follow-up appointment?: No Do you understand care options if your condition(s) worsen?: Yes-patient verbalized  understanding  SDOH Interventions Today    Flowsheet Row Most Recent Value  SDOH Interventions   Food Insecurity Interventions Intervention Not Indicated  Housing Interventions Intervention Not Indicated  Transportation Interventions Intervention Not Indicated  Utilities Interventions Intervention Not Indicated   Discussed and offered 30 day TOC program.  Patient verbally agreed  The patient has been provided with contact information for the care management team and has been advised to call with any health -related questions or concerns.  The patient verbalized understanding with current plan of care.  The patient is directed to their insurance card regarding availability of benefits coverage.    Arvin Seip RN, BSN, CCM [employer], Population Health Case Manager Phone: [phone]

## 2024-01-08 ENCOUNTER — Encounter: Payer: Self-pay | Admitting: Nurse Practitioner

## 2024-01-08 ENCOUNTER — Ambulatory Visit: Payer: Self-pay | Admitting: Nurse Practitioner

## 2024-01-08 ENCOUNTER — Telehealth: Payer: Self-pay

## 2024-01-08 VITALS — BP 121/77 | HR 96 | Wt 162.8 lb

## 2024-01-08 DIAGNOSIS — M79605 Pain in left leg: Secondary | ICD-10-CM | POA: Diagnosis not present

## 2024-01-08 DIAGNOSIS — U071 COVID-19: Secondary | ICD-10-CM

## 2024-01-08 MED ORDER — ONDANSETRON 4 MG PO TBDP: 4.0000 mg | ORAL_TABLET | Freq: Three times a day (TID) | ORAL | 3 refills | Status: AC | PRN

## 2024-01-08 MED ORDER — KETOROLAC TROMETHAMINE 30 MG/ML IJ SOLN
30.0000 mg | Freq: Once | INTRAMUSCULAR | Status: AC
  Administered 2024-01-08: 30 mg via INTRAMUSCULAR
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
## 2024-01-08 NOTE — Patient Instructions (Signed)
 Visit Information  Thank you for taking time to visit with me today. Please don't hesitate to contact me if I can be of assistance to you before our next scheduled telephone appointment.  Our next appointment is by telephone on 01/17/24 at 2 pm  Following is a copy of your care plan:   Goals Addressed             This Visit's Progress    VBCI Transitions of Care (TOC) Care Plan       Problems:  Recent Hospitalization for treatment of sickle cell pain crisis No Hospital Follow Up Provider appointment Hospital follow up visit arranged for patient for 01/08/24 aat 11:20am  and no order for outpatient PT as per stated on her discharge summary   Goal:  Over the next 30 days, the patient will not experience hospital readmission  Interventions:   Evaluation of current treatment plan related to sickle cell pain crisis, self-management and patient's adherence to plan as established by provider. Discussed plans with patient for ongoing care management follow up and provided patient with direct contact information for care management team Reviewed medications with patient and discussed importance of compliance Reviewed scheduled/upcoming provider appointments including scheduling patient hospital follow up visit with primary care provider.  Advised patient to follow up with [facility] RN case manager for 30 day TOC program.  Assessed pain level in left leg Reviewed/ discussed 01/08/24  primary care provider appointment and treatment plan  Advised patient to:  Stay hydrated - drink plenty of water Avoid extreme temperatures - heat/ cold ( avoid sudden changes in temperature such as jumping into cold water. Eat a balanced diet rich in fruits, vegetables, whole grains, and lean proteins Take medications as prescribed Follow up with your providers regularly and as recommended Consider doing regular gentle exercise such as walking Call MD for severe uncontrolled pain Call MD for temperature  >100 Increase activity slowly Call patient care center [phone] for new or ongoing symptoms/ concerns   Patient Self Care Activities:  Attend all scheduled provider appointments Notify RN Care Manager of TOC call rescheduling needs Participate in Transition of Care Program/Attend TOC scheduled calls Take medications as prescribed   Stay hydrated - drink plenty of water Avoid extreme temperatures - heat/ cold ( avoid sudden changes in temperature such as jumping into cold water. Eat a balanced diet rich in fruits, vegetables, whole grains, and lean proteins Take medications as prescribed Follow up with your providers regularly and as recommended Consider doing regular gentle exercise such as walking Call MD for severe uncontrolled pain Call MD for temperature >100 Increase activity slowly Call patient care center [phone] for new or ongoing symptoms/ concerns Advised to keep ultrasound appointment for further evaluation of left leg on 01/16/24.   Plan:  Telephone follow up appointment with care management team member scheduled for:  01/17/24 at 2 pm        Patient verbalizes understanding of instructions and care plan provided today and agrees to view in MyChart. Active MyChart status and patient understanding of how to access instructions and care plan via MyChart confirmed with patient.     The patient has been provided with contact information for the care management team and has been advised to call with any health related questions or concerns.   Please call the care guide team at [phone] if you need to cancel or reschedule your appointment.   Please call the Suicide and Crisis Lifeline: 988 call the [place]  National Suicide  Prevention Lifeline: (931) 443-7140 or TTY: 9842059358 TTY (564)833-9972) to talk to a trained counselor call 1-800-273-TALK (toll free, 24 hour hotline) if you are experiencing a Mental Health or Behavioral Health Crisis or need someone to  talk to.  Arvin Seip RN, BSN, CCM [employer], Population Health Case Manager Phone: [phone]

## 2024-01-16 ENCOUNTER — Inpatient Hospital Stay: Admission: RE | Admit: 2024-01-16 | Discharge: 2024-01-16 | Attending: Nurse Practitioner

## 2024-01-16 DIAGNOSIS — M79605 Pain in left leg: Secondary | ICD-10-CM

## 2024-01-17 ENCOUNTER — Telehealth: Payer: Self-pay

## 2024-01-17 ENCOUNTER — Ambulatory Visit: Payer: Self-pay | Admitting: Nurse Practitioner

## 2024-01-17 NOTE — Patient Instructions (Signed)
 Visit Information  Thank you for taking time to visit with me today. Please don't hesitate to contact me if I can be of assistance to you before our next scheduled telephone appointment.  Our next appointment is by telephone on 01/29/24 at 2 pm  Following is a copy of your care plan:   Goals Addressed             This Visit's Progress    VBCI Transitions of Care (TOC) Care Plan       Problems:  Recent Hospitalization for treatment of sickle cell pain crisis No Hospital Follow Up Provider appointment Hospital follow up visit arranged for patient for 01/08/24 aat 11:20am  and no order for outpatient PT as per stated on her discharge summary  patient was advised to speak with her primary care provider about outpatient PT.   Goal:  Over the next 30 days, the patient will not experience hospital readmission  Interventions:  Evaluation of current treatment plan related to sickle cell pain crisis, self-management and patient's adherence to plan as established by provider. Discussed plans with patient for ongoing care management follow up and provided patient with direct contact information for care management team Reviewed medications with patient and discussed importance of compliance Reviewed scheduled/upcoming provider appointments including scheduling patient hospital follow up visit with primary care provider.  Advised patient to follow up with [facility] RN case manager for 30 day TOC program.  Assessed pain level in left leg Advised to call her pain management provider office to reschedule appointment Advised patient to:  Stay hydrated - drink plenty of water Avoid extreme temperatures - heat/ cold ( avoid sudden changes in temperature such as jumping into cold water. Eat a balanced diet rich in fruits, vegetables, whole grains, and lean proteins Take medications as prescribed Follow up with your providers regularly and as recommended Consider doing regular gentle exercise such as  walking Call MD for severe uncontrolled pain Call MD for temperature >100 Call patient care center [phone] for new or ongoing symptoms/ concerns   Patient Self Care Activities:  Attend all scheduled provider appointments Notify RN Care Manager of TOC call rescheduling needs Participate in Transition of Care Program/Attend TOC scheduled calls Take medications as prescribed   Stay hydrated - drink plenty of water Avoid extreme temperatures - heat/ cold ( avoid sudden changes in temperature such as jumping into cold water. Eat a balanced diet rich in fruits, vegetables, whole grains, and lean proteins Take medications as prescribed Follow up with your providers regularly and as recommended Consider doing regular gentle exercise such as walking Call MD for severe uncontrolled pain Call MD for temperature >100 Call and reschedule appointment with your pain management doctor Call patient care center [phone] for new or ongoing symptoms/ concerns Advised to keep ultrasound appointment for further evaluation of left leg on 01/16/24.   Plan:  Telephone follow up appointment with care management team member scheduled for:  01/28/24 at 2 pm        Patient verbalizes understanding of instructions and care plan provided today and agrees to view in MyChart. Active MyChart status and patient understanding of how to access instructions and care plan via MyChart confirmed with patient.     The patient has been provided with contact information for the care management team and has been advised to call with any health related questions or concerns.   Please call the care guide team at [phone] if you need to cancel or reschedule your appointment.  Please call the Suicide and Crisis Lifeline: 988 call the [place]  National Suicide Prevention Lifeline: 347-462-8701 or TTY: (409)056-7389 TTY 8738706702) to talk to a trained counselor call 1-800-273-TALK (toll free, 24 hour hotline) if  you are experiencing a Mental Health or Behavioral Health Crisis or need someone to talk to.  Arvin Seip RN, BSN, CCM [employer], Population Health Case Manager Phone: [phone]
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
## 2024-01-23 ENCOUNTER — Encounter: Payer: Self-pay | Admitting: Physical Medicine & Rehabilitation

## 2024-01-27 MED ORDER — TRAMADOL HCL 50 MG PO TABS: 50.0000 mg | ORAL_TABLET | Freq: Four times a day (QID) | ORAL | 2 refills | Status: AC | PRN

## 2024-01-29 ENCOUNTER — Telehealth: Payer: Self-pay
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
## 2024-01-29 NOTE — Patient Instructions (Signed)
 Visit Information  Thank you for taking time to visit with me today. Please don't hesitate to contact me if I can be of assistance to you before our next scheduled telephone appointment.  Our next appointment is by telephone on 02/06/24 at 1 pm  Following is a copy of your care plan:   Goals Addressed             This Visit's Progress    VBCI Transitions of Care (TOC) Care Plan       Problems:  Recent Hospitalization for treatment of sickle cell pain crisis No Hospital Follow Up Provider appointment Hospital follow up visit arranged for patient for 01/08/24 aat 11:20am  and no order for outpatient PT as per stated on her discharge summary  patient was advised to speak with her primary care provider about outpatient PT. 01/29/24 Patient states she is scheduled to have her PT evaluation on 02/21/24.    Goal:  Over the next 30 days, the patient will not experience hospital readmission  Interventions:  Evaluation of current treatment plan related to sickle cell pain crisis, self-management and patient's adherence to plan as established by provider. Reviewed medications with patient and discussed importance of compliance Reviewed scheduled/upcoming provider appointments including scheduling patient hospital follow up visit with primary care provider.  Advised patient to follow up with [facility] RN case manager for 30 day TOC program.  Assessed pain level in left leg Advised to call her pain management provider office to reschedule appointment Re-Advised patient to:  Stay hydrated - drink plenty of water Avoid extreme temperatures - heat/ cold ( avoid sudden changes in temperature such as jumping into cold water. Eat a balanced diet rich in fruits, vegetables, whole grains, and lean proteins Take medications as prescribed Follow up with your providers regularly and as recommended Consider doing regular gentle exercise such as walking Call MD for severe uncontrolled pain Call MD for temperature  >100 Call patient care center [phone] for new or ongoing symptoms/ concerns   Patient Self Care Activities:  Attend all scheduled provider appointments Notify RN Care Manager of TOC call rescheduling needs Participate in Transition of Care Program/Attend TOC scheduled calls Take medications as prescribed   Continue to stay hydrated - drink plenty of water Avoid extreme temperatures - heat/ cold ( avoid sudden changes in temperature such as jumping into cold water. Eat a balanced diet rich in fruits, vegetables, whole grains, and lean proteins Take medications as prescribed Follow up with your providers regularly and as recommended Consider doing regular gentle exercise such as walking Call MD for severe uncontrolled pain Call MD for temperature >100 Call and reschedule appointment with your pain management doctor Call patient care center [phone] for new or ongoing symptoms/ concerns Advised to keep ultrasound appointment for further evaluation of left leg on 01/16/24.   Plan:  Telephone follow up appointment with care management team member scheduled for:  02/06/24 at 1 pm        Patient verbalizes understanding of instructions and care plan provided today and agrees to view in MyChart. Active MyChart status and patient understanding of how to access instructions and care plan via MyChart confirmed with patient.     The patient has been provided with contact information for the care management team and has been advised to call with any health related questions or concerns.   Please call the care guide team at [phone] if you need to cancel or reschedule your appointment.   Please call the  Suicide and Crisis Lifeline: 988 call the [place]  National Suicide Prevention Lifeline: 9523690660 or TTY: (802)010-8954 TTY 6405122941) to talk to a trained counselor call 1-800-273-TALK (toll free, 24 hour hotline) if you are experiencing a Mental Health or Behavioral Health  Crisis or need someone to talk to.  Arvin Seip RN, BSN, CCM [employer], Population Health Case Manager Phone: [phone]

## 2024-02-06 ENCOUNTER — Telehealth: Payer: Self-pay
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
## 2024-02-06 NOTE — Patient Instructions (Signed)
 Visit Information  Thank you for taking time to visit with me today. You have completed the 30 day TOC program and met your program goals. Please contact your primary care provider for any further needs/ concerns.   Following is a copy of your care plan:   Goals Addressed             This Visit's Progress    COMPLETED: [facility] Transitions of Care (TOC) Care Plan       Problems:  Recent Hospitalization for treatment of sickle cell pain crisis  Patient states she is scheduled to have her PT evaluation on 02/21/24.    Goal:  Over the next 30 days, the patient will not experience hospital readmission  Interventions:  Evaluation of current treatment plan related to sickle cell pain crisis, self-management and patient's adherence to plan as established by provider. Reviewed medications with patient and discussed importance of compliance Reviewed scheduled/upcoming provider appointments including scheduling patient hospital follow up visit with primary care provider.  Assessed pain level in left leg Offered ongoing follow up with CCM longitudinal RN case production designer, theatre/television/film.  Patient declined.  Advised to call her pain management provider office to reschedule appointment Re-Advised patient to:  Stay hydrated - drink plenty of water Avoid extreme temperatures - heat/ cold ( avoid sudden changes in temperature such as jumping into cold water. Eat a balanced diet rich in fruits, vegetables, whole grains, and lean proteins Take medications as prescribed Follow up with your providers regularly and as recommended Consider doing regular gentle exercise such as walking Call MD for severe uncontrolled pain Call MD for temperature >100 Call patient care center [phone] for new or ongoing symptoms/ concerns   Patient Self Care Activities:  Attend all scheduled provider appointments Take medications as prescribed   Continue to stay hydrated - drink plenty of water Avoid extreme temperatures - heat/ cold  ( avoid sudden changes in temperature such as jumping into cold water. Eat a balanced diet rich in fruits, vegetables, whole grains, and lean proteins Follow up with your providers regularly and as recommended Consider doing regular gentle exercise such as walking Call MD for severe uncontrolled pain Call MD for temperature >100 Call and reschedule appointment with your pain management doctor Call patient care center [phone] for new or ongoing symptoms/ concerns  Plan:  No further follow up required: Patient has completed the 30 day TOC program and met her program goals        Patient verbalizes understanding of instructions and care plan provided today and agrees to view in MyChart. Active MyChart status and patient understanding of how to access instructions and care plan via MyChart confirmed with patient.     The patient has been provided with contact information for the care management team and has been advised to call with any health related questions or concerns.   Please call the care guide team at [phone] if you need to cancel or reschedule your appointment.   Please call the Suicide and Crisis Lifeline: 988 call the [place]  National Suicide Prevention Lifeline: 779-732-3917 or TTY: 8015063571 TTY 8565553004) to talk to a trained counselor call 1-800-273-TALK (toll free, 24 hour hotline) if you are experiencing a Mental Health or Behavioral Health Crisis or need someone to talk to.  Arvin Seip RN, BSN, CCM [employer], Population Health Case Manager Phone: [phone]

## 2024-02-20 ENCOUNTER — Encounter: Payer: Self-pay | Admitting: Physical Medicine & Rehabilitation

## 2024-02-20 ENCOUNTER — Encounter: Attending: Physical Medicine & Rehabilitation | Admitting: Physical Medicine & Rehabilitation

## 2024-02-20 VITALS — BP 115/79 | HR 108 | Ht 60.0 in | Wt 162.4 lb

## 2024-02-20 DIAGNOSIS — M797 Fibromyalgia: Secondary | ICD-10-CM | POA: Diagnosis not present

## 2024-02-20 DIAGNOSIS — M069 Rheumatoid arthritis, unspecified: Secondary | ICD-10-CM | POA: Diagnosis not present

## 2024-02-20 DIAGNOSIS — D573 Sickle-cell trait: Secondary | ICD-10-CM | POA: Insufficient documentation

## 2024-02-20 DIAGNOSIS — Z5181 Encounter for therapeutic drug level monitoring: Secondary | ICD-10-CM | POA: Diagnosis not present

## 2024-02-20 MED ORDER — PREGABALIN 150 MG PO CAPS: 150.0000 mg | ORAL_CAPSULE | Freq: Three times a day (TID) | ORAL | 0 refills | Status: AC
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
## 2024-02-21 ENCOUNTER — Encounter (HOSPITAL_BASED_OUTPATIENT_CLINIC_OR_DEPARTMENT_OTHER): Payer: Self-pay | Admitting: Physical Therapy

## 2024-02-21 ENCOUNTER — Ambulatory Visit (HOSPITAL_BASED_OUTPATIENT_CLINIC_OR_DEPARTMENT_OTHER): Attending: Physical Medicine & Rehabilitation | Admitting: Physical Therapy

## 2024-02-21 ENCOUNTER — Other Ambulatory Visit: Payer: Self-pay

## 2024-02-21 DIAGNOSIS — M5459 Other low back pain: Secondary | ICD-10-CM | POA: Diagnosis present

## 2024-02-21 DIAGNOSIS — M542 Cervicalgia: Secondary | ICD-10-CM | POA: Insufficient documentation

## 2024-02-21 DIAGNOSIS — M797 Fibromyalgia: Secondary | ICD-10-CM | POA: Insufficient documentation

## 2024-02-21 DIAGNOSIS — R293 Abnormal posture: Secondary | ICD-10-CM | POA: Diagnosis present

## 2024-02-21 DIAGNOSIS — M6281 Muscle weakness (generalized): Secondary | ICD-10-CM | POA: Insufficient documentation

## 2024-02-26 ENCOUNTER — Encounter (HOSPITAL_BASED_OUTPATIENT_CLINIC_OR_DEPARTMENT_OTHER): Payer: Self-pay | Admitting: Physical Therapy

## 2024-02-26 ENCOUNTER — Ambulatory Visit (HOSPITAL_BASED_OUTPATIENT_CLINIC_OR_DEPARTMENT_OTHER): Admitting: Physical Therapy

## 2024-02-26 DIAGNOSIS — M5459 Other low back pain: Secondary | ICD-10-CM | POA: Diagnosis not present

## 2024-02-26 DIAGNOSIS — M542 Cervicalgia: Secondary | ICD-10-CM

## 2024-02-26 DIAGNOSIS — M6281 Muscle weakness (generalized): Secondary | ICD-10-CM
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
## 2024-03-04 ENCOUNTER — Ambulatory Visit (HOSPITAL_BASED_OUTPATIENT_CLINIC_OR_DEPARTMENT_OTHER): Admitting: Physical Therapy

## 2024-03-04 ENCOUNTER — Encounter (HOSPITAL_BASED_OUTPATIENT_CLINIC_OR_DEPARTMENT_OTHER): Payer: Self-pay | Admitting: Physical Therapy

## 2024-03-04 DIAGNOSIS — M5459 Other low back pain: Secondary | ICD-10-CM

## 2024-03-04 DIAGNOSIS — M6281 Muscle weakness (generalized): Secondary | ICD-10-CM

## 2024-03-04 DIAGNOSIS — R293 Abnormal posture: Secondary | ICD-10-CM

## 2024-03-04 DIAGNOSIS — M542 Cervicalgia: Secondary | ICD-10-CM
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
## 2024-03-18 ENCOUNTER — Ambulatory Visit (HOSPITAL_BASED_OUTPATIENT_CLINIC_OR_DEPARTMENT_OTHER): Admitting: Physical Therapy

## 2024-03-24 ENCOUNTER — Ambulatory Visit (HOSPITAL_BASED_OUTPATIENT_CLINIC_OR_DEPARTMENT_OTHER): Admitting: Physical Therapy

## 2024-03-26 ENCOUNTER — Ambulatory Visit (HOSPITAL_BASED_OUTPATIENT_CLINIC_OR_DEPARTMENT_OTHER): Admitting: Physical Therapy

## 2024-03-31 ENCOUNTER — Ambulatory Visit (HOSPITAL_BASED_OUTPATIENT_CLINIC_OR_DEPARTMENT_OTHER): Admitting: Physical Therapy

## 2024-04-02 ENCOUNTER — Ambulatory Visit (HOSPITAL_BASED_OUTPATIENT_CLINIC_OR_DEPARTMENT_OTHER): Admitting: Physical Therapy

## 2024-04-07 ENCOUNTER — Ambulatory Visit (HOSPITAL_BASED_OUTPATIENT_CLINIC_OR_DEPARTMENT_OTHER): Admitting: Physical Therapy

## 2024-04-09 ENCOUNTER — Ambulatory Visit (HOSPITAL_BASED_OUTPATIENT_CLINIC_OR_DEPARTMENT_OTHER): Admitting: Physical Therapy

## 2024-04-14 ENCOUNTER — Ambulatory Visit (HOSPITAL_BASED_OUTPATIENT_CLINIC_OR_DEPARTMENT_OTHER): Admitting: Physical Therapy

## 2024-04-16 ENCOUNTER — Ambulatory Visit (HOSPITAL_BASED_OUTPATIENT_CLINIC_OR_DEPARTMENT_OTHER): Admitting: Physical Therapy

## 2024-04-20 ENCOUNTER — Encounter: Admitting: Physical Medicine & Rehabilitation

## 2024-04-21 ENCOUNTER — Ambulatory Visit (HOSPITAL_BASED_OUTPATIENT_CLINIC_OR_DEPARTMENT_OTHER): Admitting: Physical Therapy

## 2024-04-23 ENCOUNTER — Ambulatory Visit (HOSPITAL_BASED_OUTPATIENT_CLINIC_OR_DEPARTMENT_OTHER): Admitting: Physical Therapy

## 2024-04-28 ENCOUNTER — Ambulatory Visit (HOSPITAL_BASED_OUTPATIENT_CLINIC_OR_DEPARTMENT_OTHER): Admitting: Physical Therapy

## 2024-04-30 ENCOUNTER — Ambulatory Visit (HOSPITAL_BASED_OUTPATIENT_CLINIC_OR_DEPARTMENT_OTHER): Admitting: Physical Therapy

## 2024-05-04 ENCOUNTER — Other Ambulatory Visit

## 2024-05-05 ENCOUNTER — Ambulatory Visit (HOSPITAL_BASED_OUTPATIENT_CLINIC_OR_DEPARTMENT_OTHER): Admitting: Physical Therapy

## 2024-05-07 ENCOUNTER — Ambulatory Visit (HOSPITAL_BASED_OUTPATIENT_CLINIC_OR_DEPARTMENT_OTHER): Admitting: Physical Therapy

## 2024-05-12 ENCOUNTER — Ambulatory Visit (HOSPITAL_BASED_OUTPATIENT_CLINIC_OR_DEPARTMENT_OTHER): Admitting: Physical Therapy

## 2024-05-14 ENCOUNTER — Ambulatory Visit (HOSPITAL_BASED_OUTPATIENT_CLINIC_OR_DEPARTMENT_OTHER): Admitting: Physical Therapy

## 2024-05-18 ENCOUNTER — Ambulatory Visit: Payer: Self-pay | Admitting: Internal Medicine

## 2024-05-19 ENCOUNTER — Ambulatory Visit (HOSPITAL_BASED_OUTPATIENT_CLINIC_OR_DEPARTMENT_OTHER): Admitting: Physical Therapy

## 2024-05-26 ENCOUNTER — Ambulatory Visit (HOSPITAL_BASED_OUTPATIENT_CLINIC_OR_DEPARTMENT_OTHER): Admitting: Physical Therapy

## 2024-05-28 ENCOUNTER — Ambulatory Visit (HOSPITAL_BASED_OUTPATIENT_CLINIC_OR_DEPARTMENT_OTHER): Admitting: Physical Therapy
